# Patient Record
Sex: Female | Born: 1935 | Race: White | Hispanic: No | Smoking: Never smoker
Health system: Southern US, Community
[De-identification: ages and names within clinical notes are randomized; demographics above are authoritative.]

## PROBLEM LIST (undated history)

## (undated) DIAGNOSIS — Z8739 Personal history of other diseases of the musculoskeletal system and connective tissue: Secondary | ICD-10-CM

## (undated) DIAGNOSIS — M5030 Other cervical disc degeneration, unspecified cervical region: Secondary | ICD-10-CM

## (undated) DIAGNOSIS — K219 Gastro-esophageal reflux disease without esophagitis: Secondary | ICD-10-CM

## (undated) DIAGNOSIS — J329 Chronic sinusitis, unspecified: Secondary | ICD-10-CM

## (undated) DIAGNOSIS — M2010 Hallux valgus (acquired), unspecified foot: Secondary | ICD-10-CM

## (undated) DIAGNOSIS — K648 Other hemorrhoids: Secondary | ICD-10-CM

## (undated) DIAGNOSIS — F419 Anxiety disorder, unspecified: Secondary | ICD-10-CM

## (undated) DIAGNOSIS — I10 Essential (primary) hypertension: Secondary | ICD-10-CM

## (undated) DIAGNOSIS — K449 Diaphragmatic hernia without obstruction or gangrene: Secondary | ICD-10-CM

## (undated) DIAGNOSIS — K2970 Gastritis, unspecified, without bleeding: Secondary | ICD-10-CM

## (undated) DIAGNOSIS — F32A Depression, unspecified: Secondary | ICD-10-CM

## (undated) DIAGNOSIS — K2270 Barrett's esophagus without dysplasia: Secondary | ICD-10-CM

## (undated) DIAGNOSIS — Z9109 Other allergy status, other than to drugs and biological substances: Secondary | ICD-10-CM

## (undated) DIAGNOSIS — K5790 Diverticulosis of intestine, part unspecified, without perforation or abscess without bleeding: Secondary | ICD-10-CM

## (undated) DIAGNOSIS — N760 Acute vaginitis: Secondary | ICD-10-CM

## (undated) DIAGNOSIS — N6019 Diffuse cystic mastopathy of unspecified breast: Secondary | ICD-10-CM

## (undated) DIAGNOSIS — Z8619 Personal history of other infectious and parasitic diseases: Secondary | ICD-10-CM

## (undated) DIAGNOSIS — H25019 Cortical age-related cataract, unspecified eye: Secondary | ICD-10-CM

## (undated) DIAGNOSIS — F329 Major depressive disorder, single episode, unspecified: Secondary | ICD-10-CM

## (undated) DIAGNOSIS — E785 Hyperlipidemia, unspecified: Secondary | ICD-10-CM

## (undated) DIAGNOSIS — E7800 Pure hypercholesterolemia, unspecified: Secondary | ICD-10-CM

## (undated) DIAGNOSIS — N951 Menopausal and female climacteric states: Secondary | ICD-10-CM

## (undated) DIAGNOSIS — M898X9 Other specified disorders of bone, unspecified site: Secondary | ICD-10-CM

## (undated) HISTORY — PX: NISSEN FUNDOPLICATION: SHX2091

## (undated) HISTORY — PX: CATARACT EXTRACTION W/ INTRAOCULAR LENS IMPLANTW/ TRABECULECTOMY: SHX1312

## (undated) HISTORY — PX: BREAST SURGERY: SHX581

## (undated) HISTORY — PX: HERNIA REPAIR: SHX51

## (undated) HISTORY — PX: ABDOMINAL HYSTERECTOMY: SHX81

## (undated) HISTORY — PX: CHOLECYSTECTOMY: SHX55

## (undated) HISTORY — PX: TONSILLECTOMY: SUR1361

## (undated) HISTORY — PX: APPENDECTOMY: SHX54

---

## 2002-12-15 ENCOUNTER — Encounter: Payer: Self-pay | Admitting: *Deleted

## 2002-12-15 ENCOUNTER — Emergency Department (HOSPITAL_COMMUNITY): Admission: EM | Admit: 2002-12-15 | Discharge: 2002-12-15 | Payer: Self-pay | Admitting: Emergency Medicine

## 2005-09-05 ENCOUNTER — Ambulatory Visit: Payer: Self-pay | Admitting: Internal Medicine

## 2006-09-07 ENCOUNTER — Ambulatory Visit: Payer: Self-pay | Admitting: Internal Medicine

## 2007-04-02 ENCOUNTER — Ambulatory Visit: Payer: Self-pay | Admitting: Gastroenterology

## 2007-05-04 ENCOUNTER — Ambulatory Visit: Payer: Self-pay | Admitting: Gastroenterology

## 2008-01-18 ENCOUNTER — Ambulatory Visit: Payer: Self-pay | Admitting: Internal Medicine

## 2008-12-09 ENCOUNTER — Ambulatory Visit: Payer: Self-pay | Admitting: Internal Medicine

## 2009-01-20 ENCOUNTER — Ambulatory Visit: Payer: Self-pay | Admitting: Internal Medicine

## 2009-01-27 ENCOUNTER — Emergency Department: Payer: Self-pay | Admitting: Internal Medicine

## 2009-01-29 ENCOUNTER — Ambulatory Visit: Payer: Self-pay | Admitting: Internal Medicine

## 2009-02-10 ENCOUNTER — Ambulatory Visit: Payer: Self-pay | Admitting: Internal Medicine

## 2009-08-13 ENCOUNTER — Ambulatory Visit: Payer: Self-pay | Admitting: Internal Medicine

## 2009-08-26 ENCOUNTER — Ambulatory Visit: Payer: Self-pay | Admitting: Family Medicine

## 2010-01-21 ENCOUNTER — Ambulatory Visit: Payer: Self-pay | Admitting: Internal Medicine

## 2010-07-08 ENCOUNTER — Ambulatory Visit: Payer: Self-pay | Admitting: Gastroenterology

## 2010-07-12 LAB — PATHOLOGY REPORT

## 2011-01-13 ENCOUNTER — Ambulatory Visit: Payer: Self-pay | Admitting: Sports Medicine

## 2011-02-17 ENCOUNTER — Ambulatory Visit: Payer: Self-pay | Admitting: Internal Medicine

## 2011-03-03 ENCOUNTER — Ambulatory Visit: Payer: Self-pay | Admitting: Internal Medicine

## 2011-10-19 IMAGING — US ULTRASOUND LEFT BREAST
1 series · 17 of 19 positions shown · non-contrast
Comparison: none

REASON FOR EXAM: lt nodule
COMMENTS:

[Series 1: ultrasound left breast · 17 of 19 slices shown]
[im 1/19]
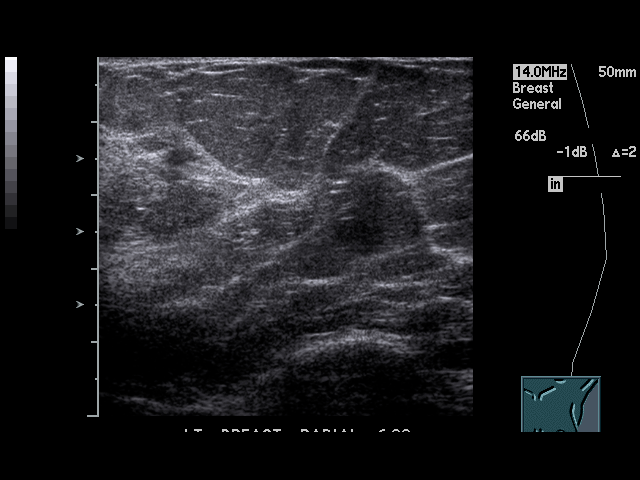
[im 2/19]
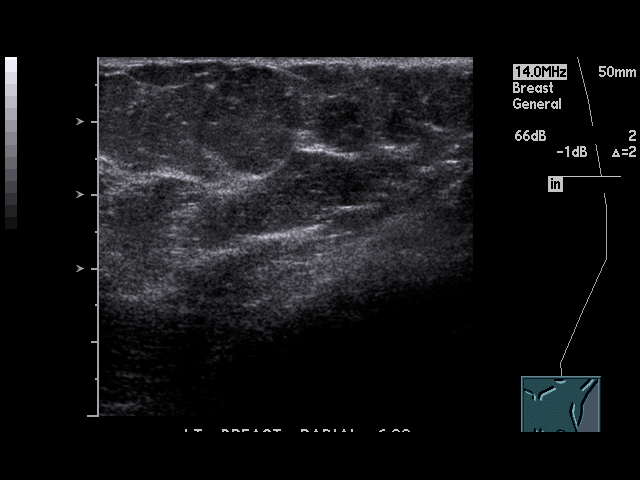
[im 3/19]
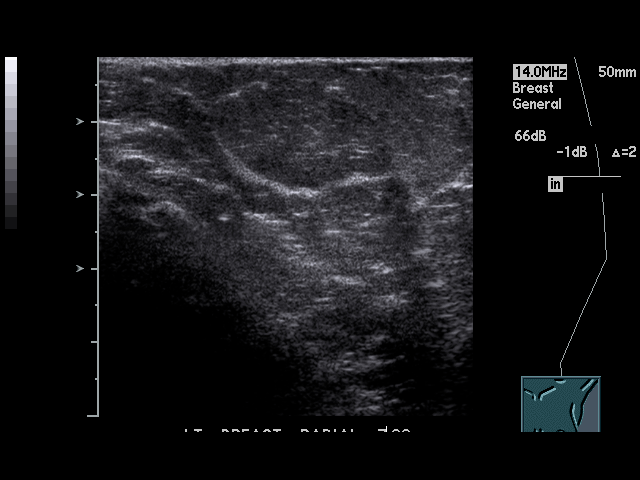
[im 4/19]
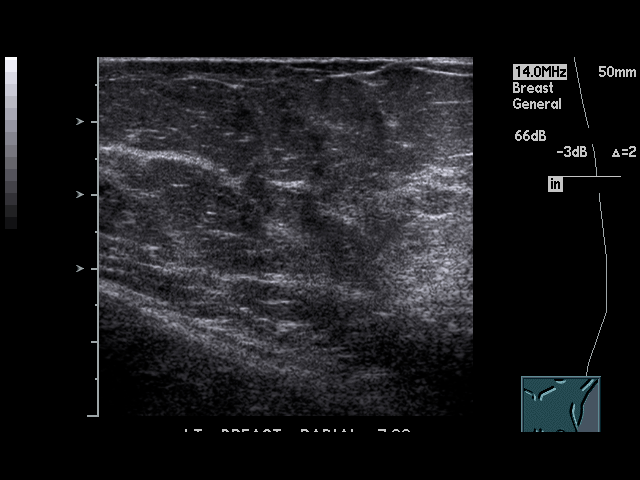
[im 6/19]
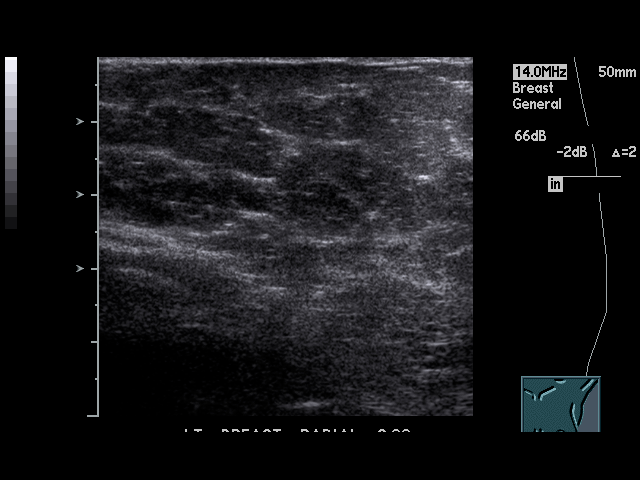
[im 7/19]
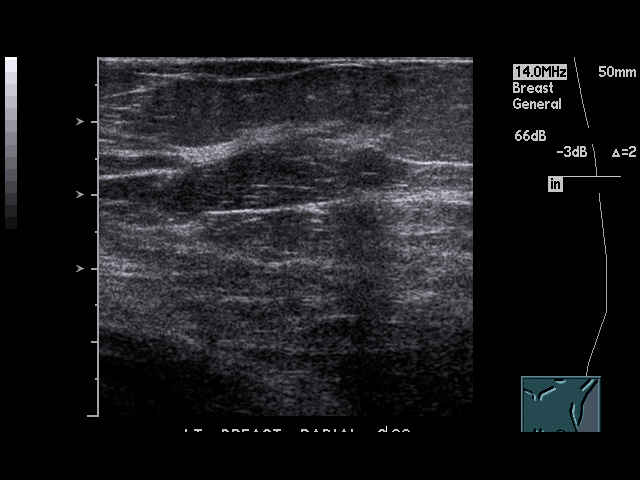
[im 8/19]
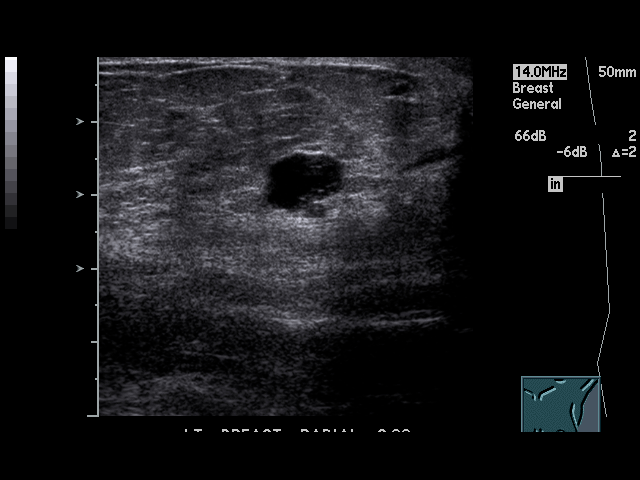
[im 9/19]
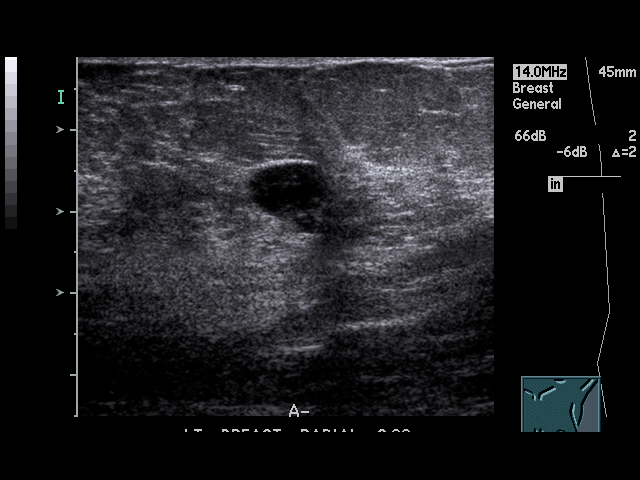
[im 10/19]
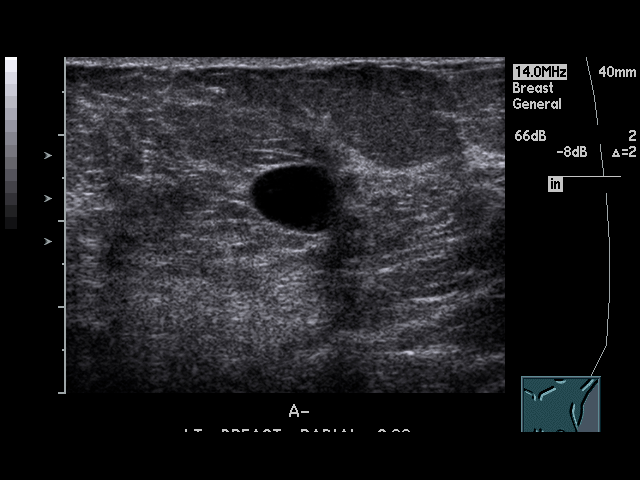
[im 11/19]
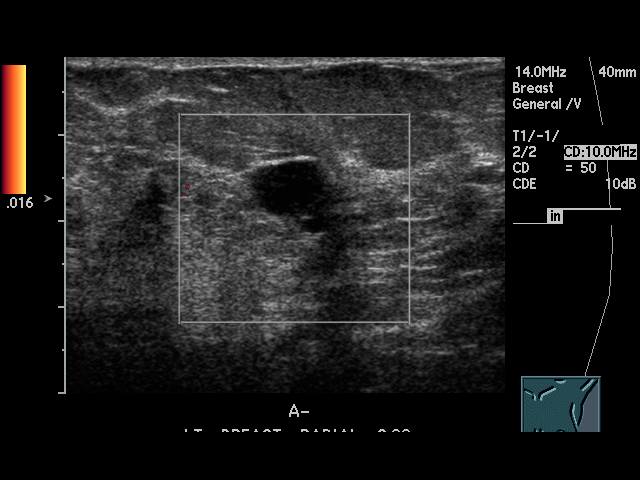
[im 12/19]
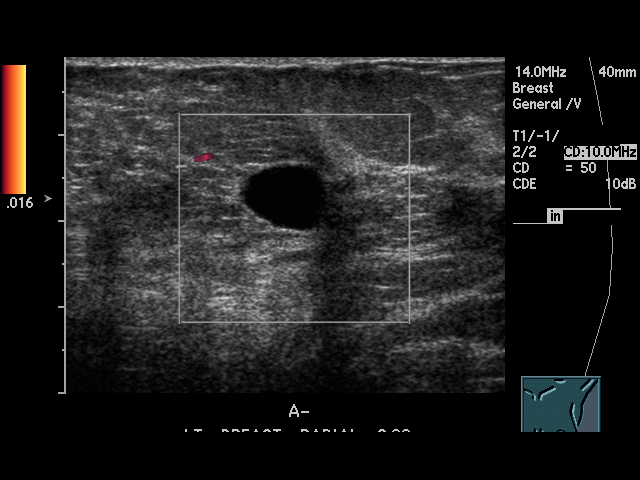
[im 13/19]
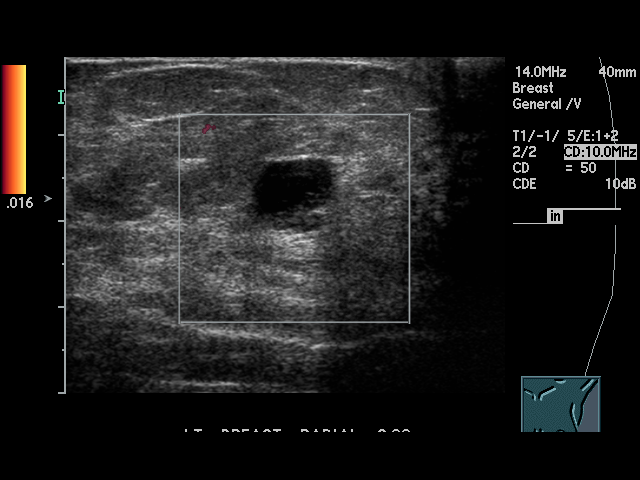
[im 14/19]
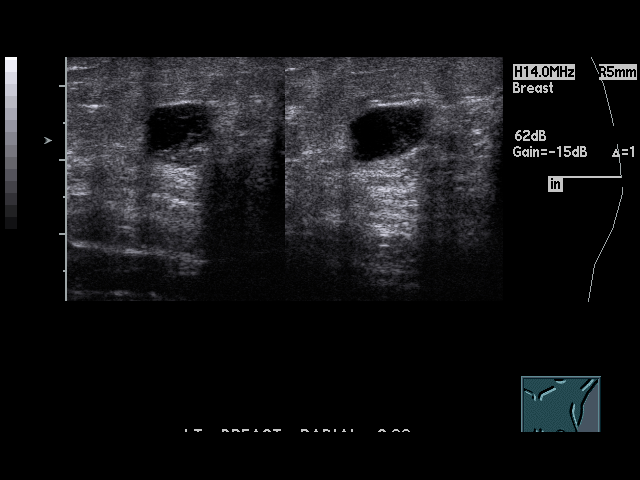
[im 16/19]
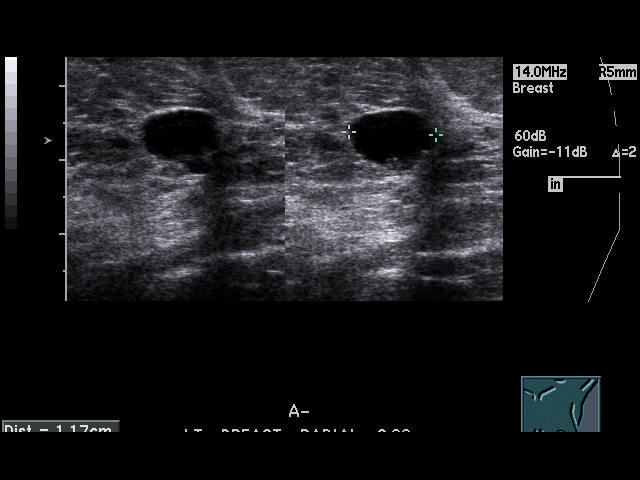
[im 17/19]
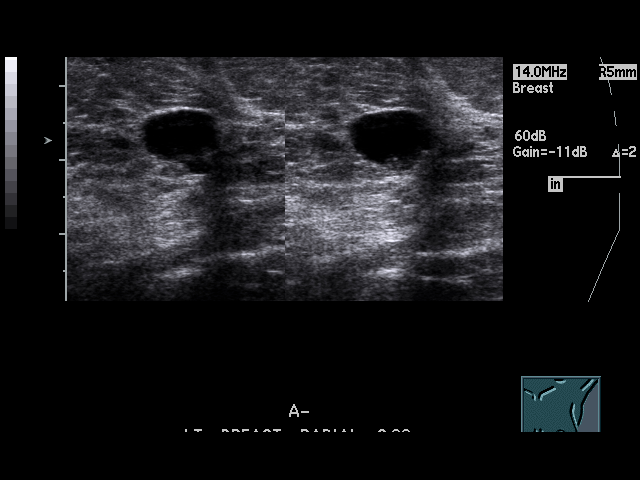
[im 18/19]
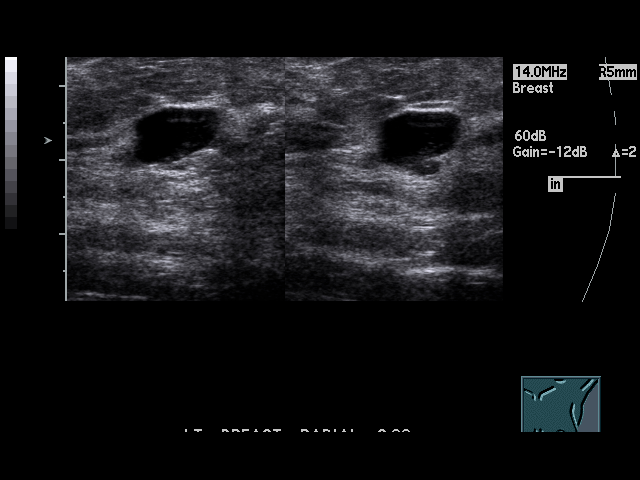
[im 19/19]
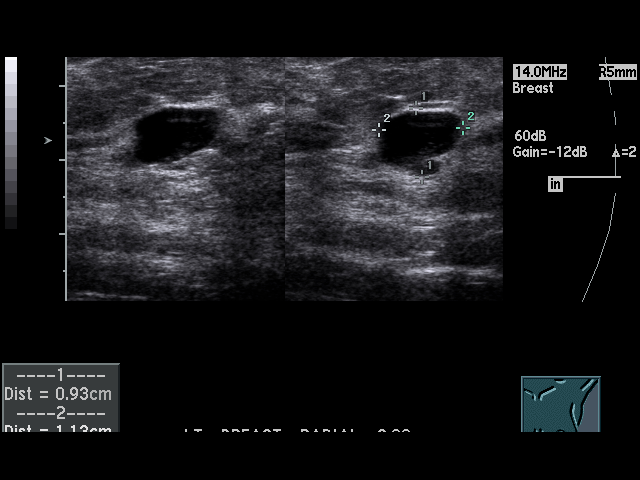

[17 of 19 positions shown; findings below may reference images not displayed]

PROCEDURE:     US  - US BREAST LEFT  - March 03, 2011 [DATE]

RESULT:

Ultrasound was performed of the left breast and reveals a cyst with possible
internal echoes and/or debris. Mild septation may be present. The cyst
measures 1 cm. Needle aspiration of the cyst is suggested to exclude a
malignant cyst.
IMPRESSION: A complex cyst is noted of the left breast for which needle
aspiration is suggested to evaluate for a possible malignant cyst. This is
most likely a benign cyst containing debris.

BI-RADS: Category 4 - Suspicious Abnormality

## 2011-11-15 ENCOUNTER — Emergency Department: Payer: Self-pay | Admitting: Emergency Medicine

## 2011-11-18 ENCOUNTER — Emergency Department: Payer: Self-pay | Admitting: Internal Medicine

## 2011-12-09 ENCOUNTER — Ambulatory Visit: Payer: Self-pay | Admitting: Internal Medicine

## 2012-02-20 ENCOUNTER — Ambulatory Visit: Payer: Self-pay | Admitting: Internal Medicine

## 2012-09-18 ENCOUNTER — Ambulatory Visit: Payer: Self-pay | Admitting: Physical Medicine and Rehabilitation

## 2013-03-06 ENCOUNTER — Ambulatory Visit: Payer: Self-pay | Admitting: Internal Medicine

## 2013-03-21 ENCOUNTER — Ambulatory Visit: Payer: Self-pay | Admitting: Internal Medicine

## 2013-05-21 ENCOUNTER — Ambulatory Visit: Payer: Self-pay | Admitting: Cardiology

## 2013-11-06 IMAGING — US ULTRASOUND LEFT BREAST
2 series · 13 of 25 positions shown · non-contrast
Comparison: none

REASON FOR EXAM: av lt mass
COMMENTS:

PROCEDURE:     US  - US BREAST LEFT  - March 21, 2013  [DATE]
RESULT:
Comparisons: 03/06/2013, 02/20/2012, 02/17/2011, and 01/21/2010.

[Series 1: ultrasound left breast · 0.08mm/px · 12 of 45 slices shown (1 of 2)]
[im 1/45]
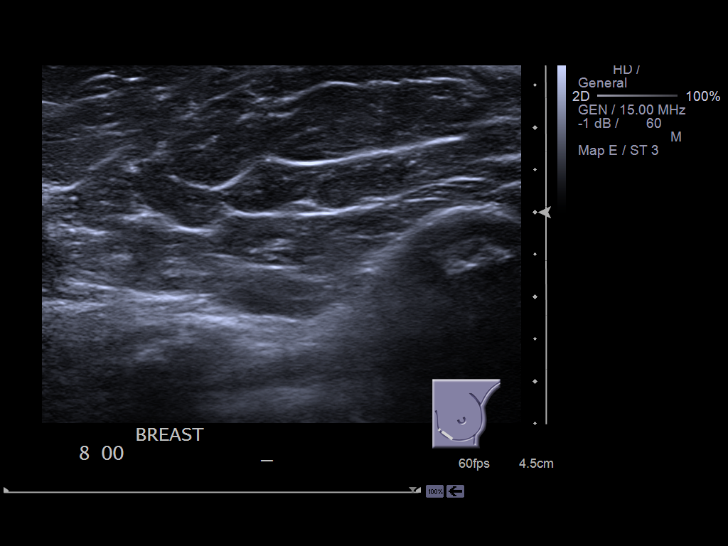
[im 4/45]
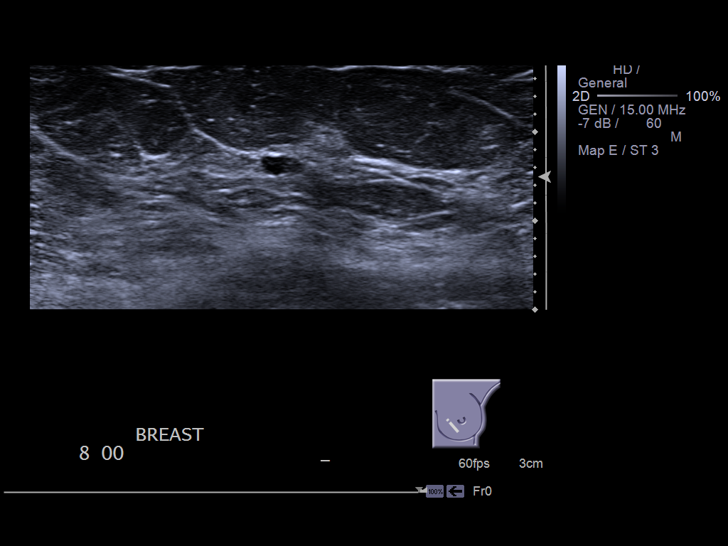
[im 8/45]
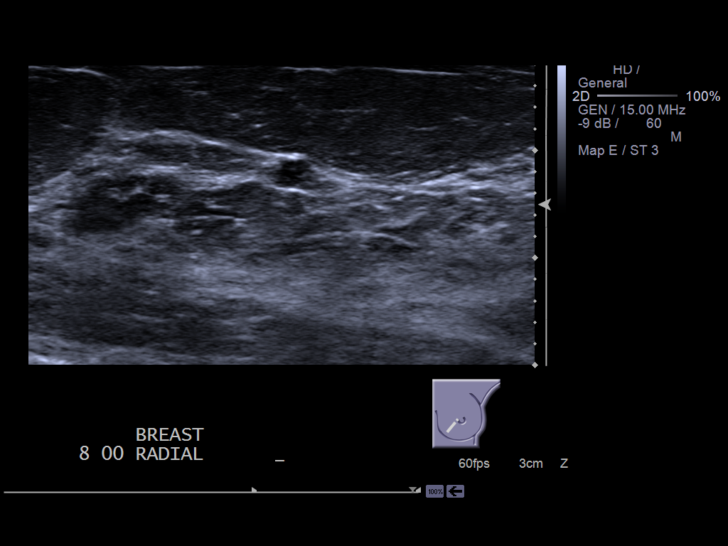
[im 12/45]
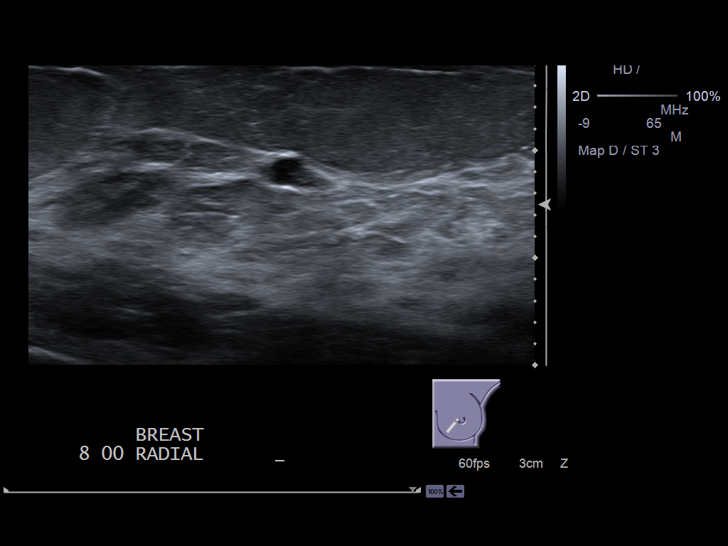
[im 16/45]
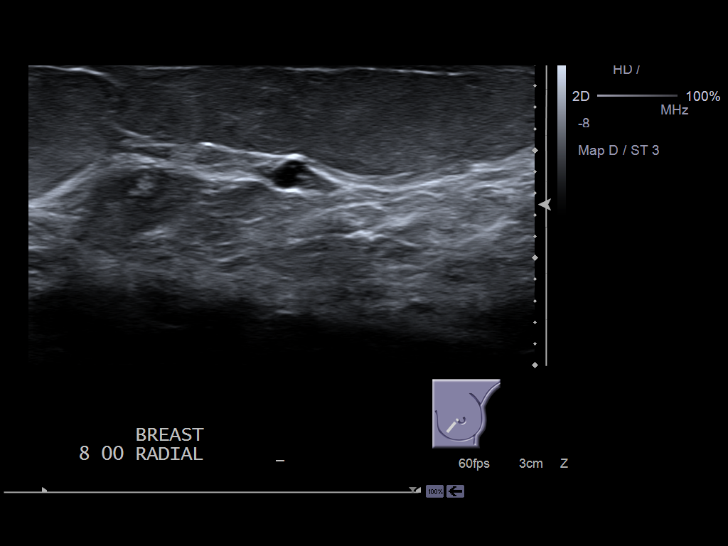
[im 20/45]
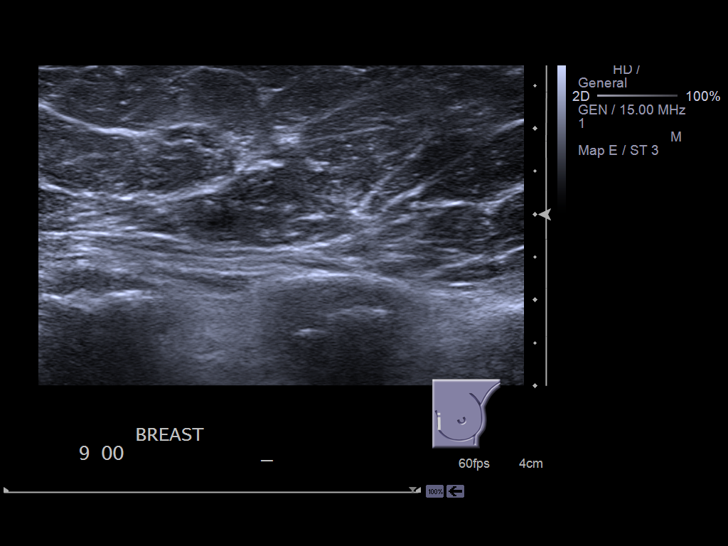
[im 23/45]
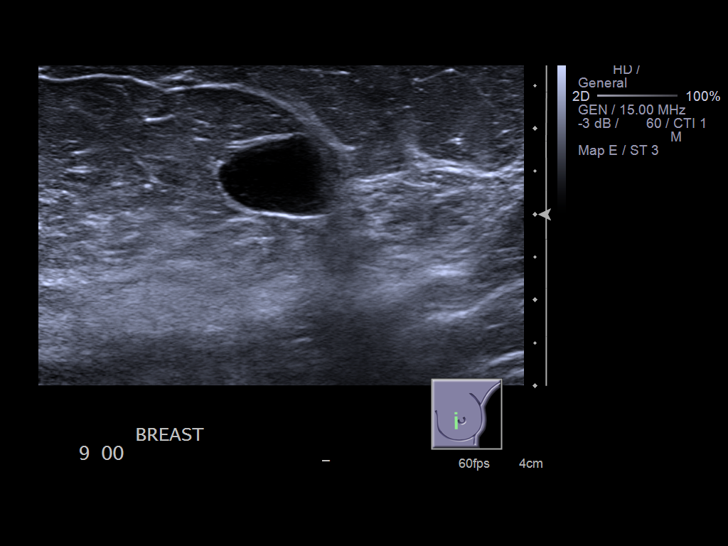
[im 27/45]
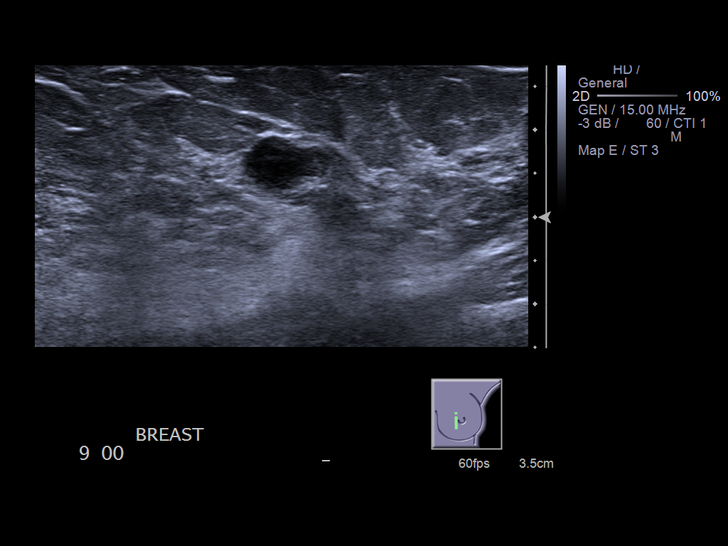
[im 31/45]
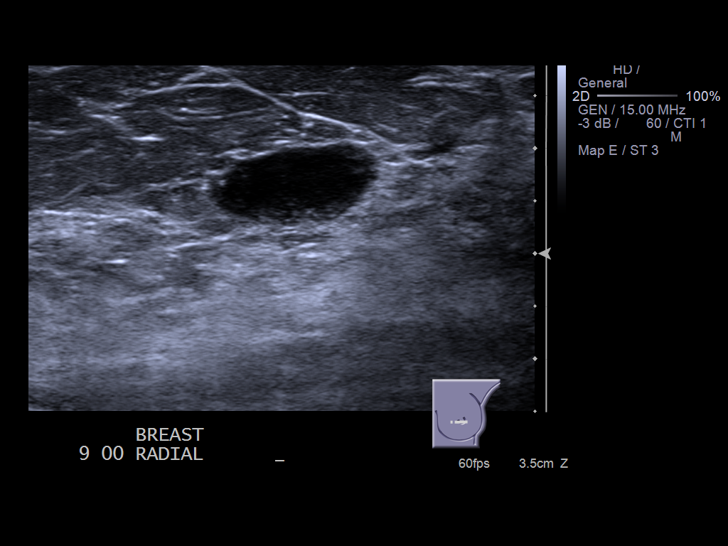
[im 35/45]
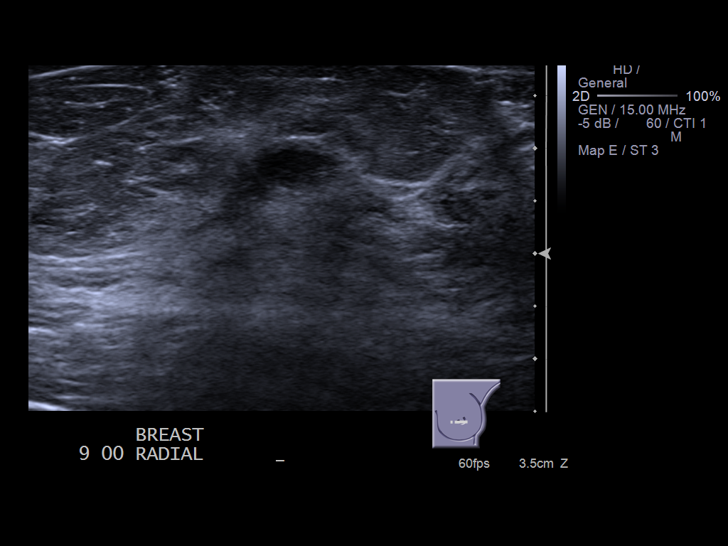
[im 39/45]
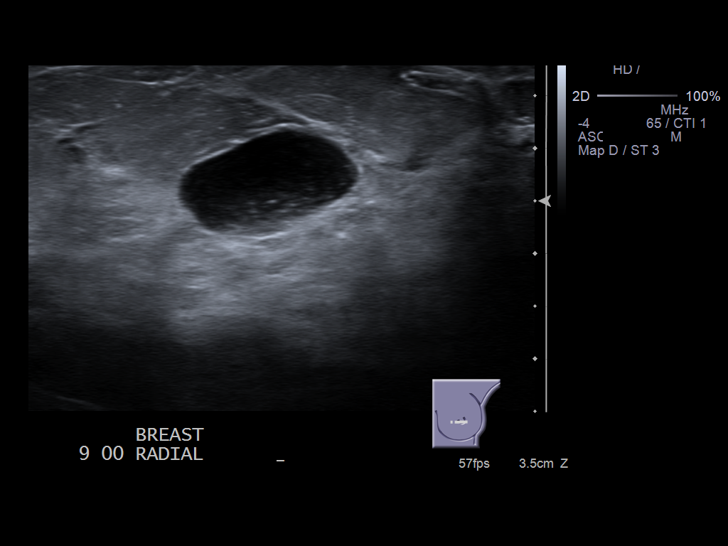
[im 43/45]
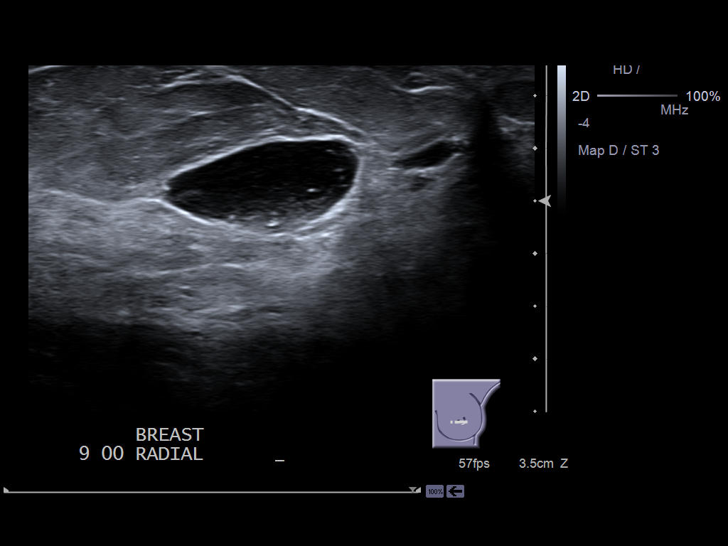

[Series 1001: ultrasound left breast · 2 acquisitions, 1 frame shown (2 of 2)]
[im 1/2]
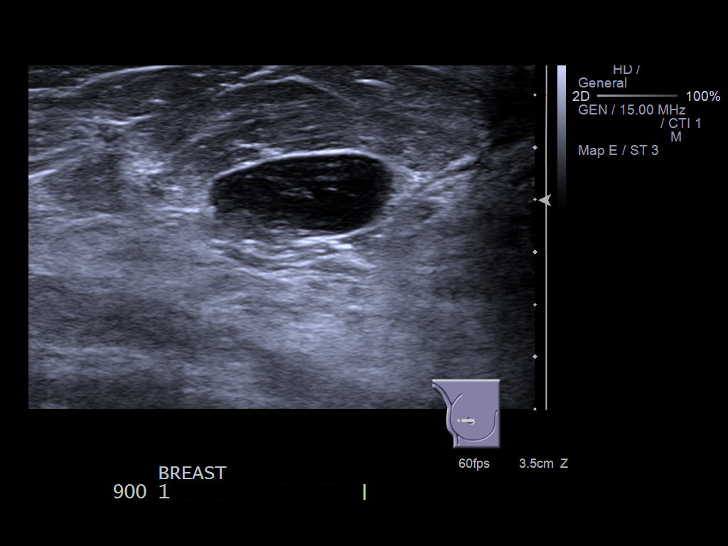

[13 of 25 positions shown; findings below may reference images not displayed]

FINDINGS: True lateral and spot compression magnification views were performed to
evaluate the round mass in the medial left breast anterior depth. These
views demonstrate a well circumscribed round mass at approximately 8-9
o'clock in the anterior left breast. This mass has increased in size from
the prior studies. By report, this mass has been aspirated in the past.
There is a small cluster of round microcalcifications on the spot
compression magnification views which is similar to multiple prior
mammograms.

Real time ultrasound was performed of the left breast from 8 o'clock through
9 o'clock. At 9 o'clock there is an oval, well-circumscribed mass which
measures 1.7 x 1.5 x 1.1 cm. Some of the mass is nearly anechoic, while
there is hypoechogenicity along the dependent portion of the mass. This
hypoechogenicity could represent debris. However, a solid component cannot
be excluded. There is suggestion of some posterior acoustic enhancement to
the mass, though this is not definitive. Separately, at 8 o'clock there is a
tiny nearly anechoic round mass. It measures 3 mm in diameter. It is
difficult to characterize given its very small size, but likely represents a
cyst.
IMPRESSION: BI-RADS: Category 4 - Suspicious Abnormality.

1. The mass in the medial left breast could represent a complicated cyst.
However, a solid and cystic mass cannot be excluded. Additionally, there
does not appear to be definitive posterior acoustic enhancement to this
mass. By report, the mass has been aspirated in the past. However, given the
increase in size and the areas of hypoechogenicity, aspiration of this mass
is recommended with potential for biopsy if there are any solid areas.

## 2013-12-26 HISTORY — PX: BREAST BIOPSY: SHX20

## 2013-12-26 HISTORY — PX: BREAST CYST ASPIRATION: SHX578

## 2014-03-25 ENCOUNTER — Ambulatory Visit: Payer: Self-pay | Admitting: Internal Medicine

## 2014-04-02 ENCOUNTER — Ambulatory Visit: Payer: Self-pay | Admitting: Ophthalmology

## 2014-04-11 ENCOUNTER — Ambulatory Visit: Payer: Self-pay | Admitting: Surgery

## 2014-04-14 LAB — PATHOLOGY REPORT

## 2014-08-13 ENCOUNTER — Ambulatory Visit: Payer: Self-pay | Admitting: Ophthalmology

## 2014-11-27 IMAGING — MG MM POST US BIOPSY *L*
1 series · 2 of 2 positions shown · non-contrast
Comparison: Previous exams

CLINICAL DATA: Ultrasound-guided biopsy of a left breast mass in
the 9 o'clock position.

EXAM:
DIAGNOSTIC LEFT MAMMOGRAM POST ULTRASOUND BIOPSY

[L CC · left · 2 of 2 slices shown]
[im 1/2]
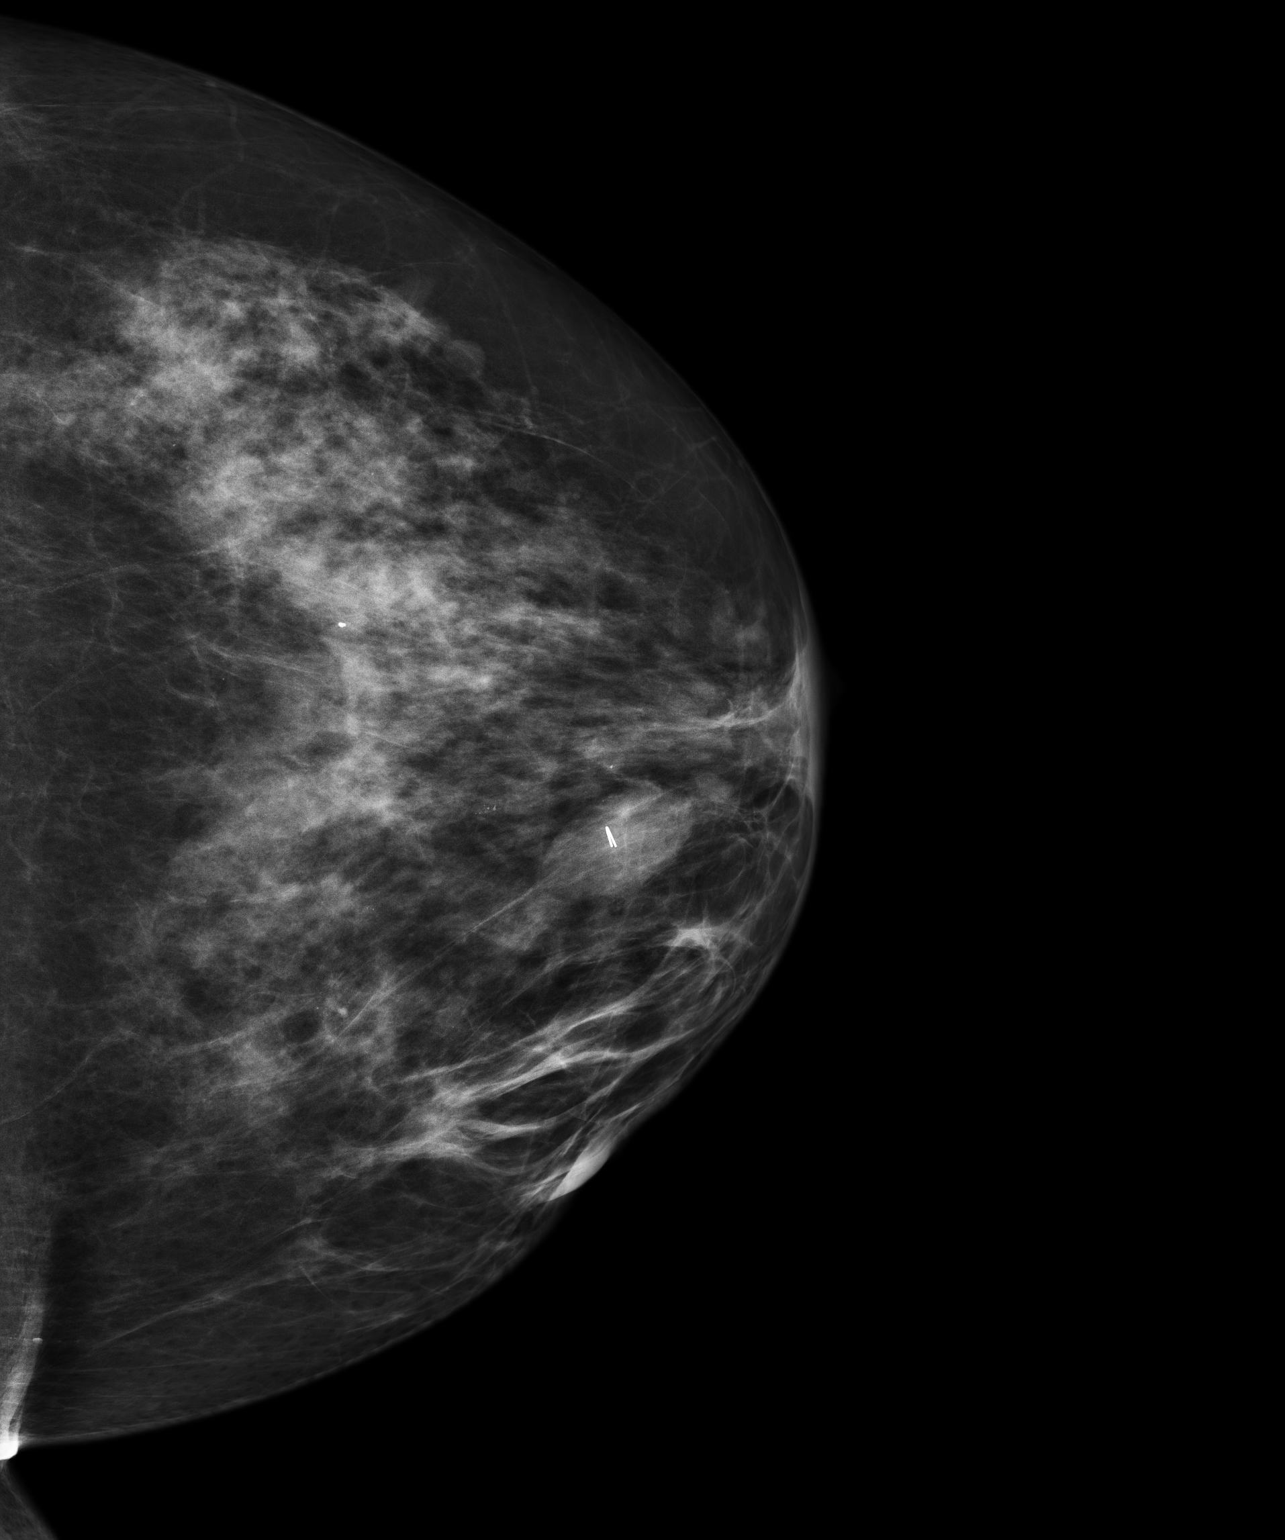
[im 2/2]
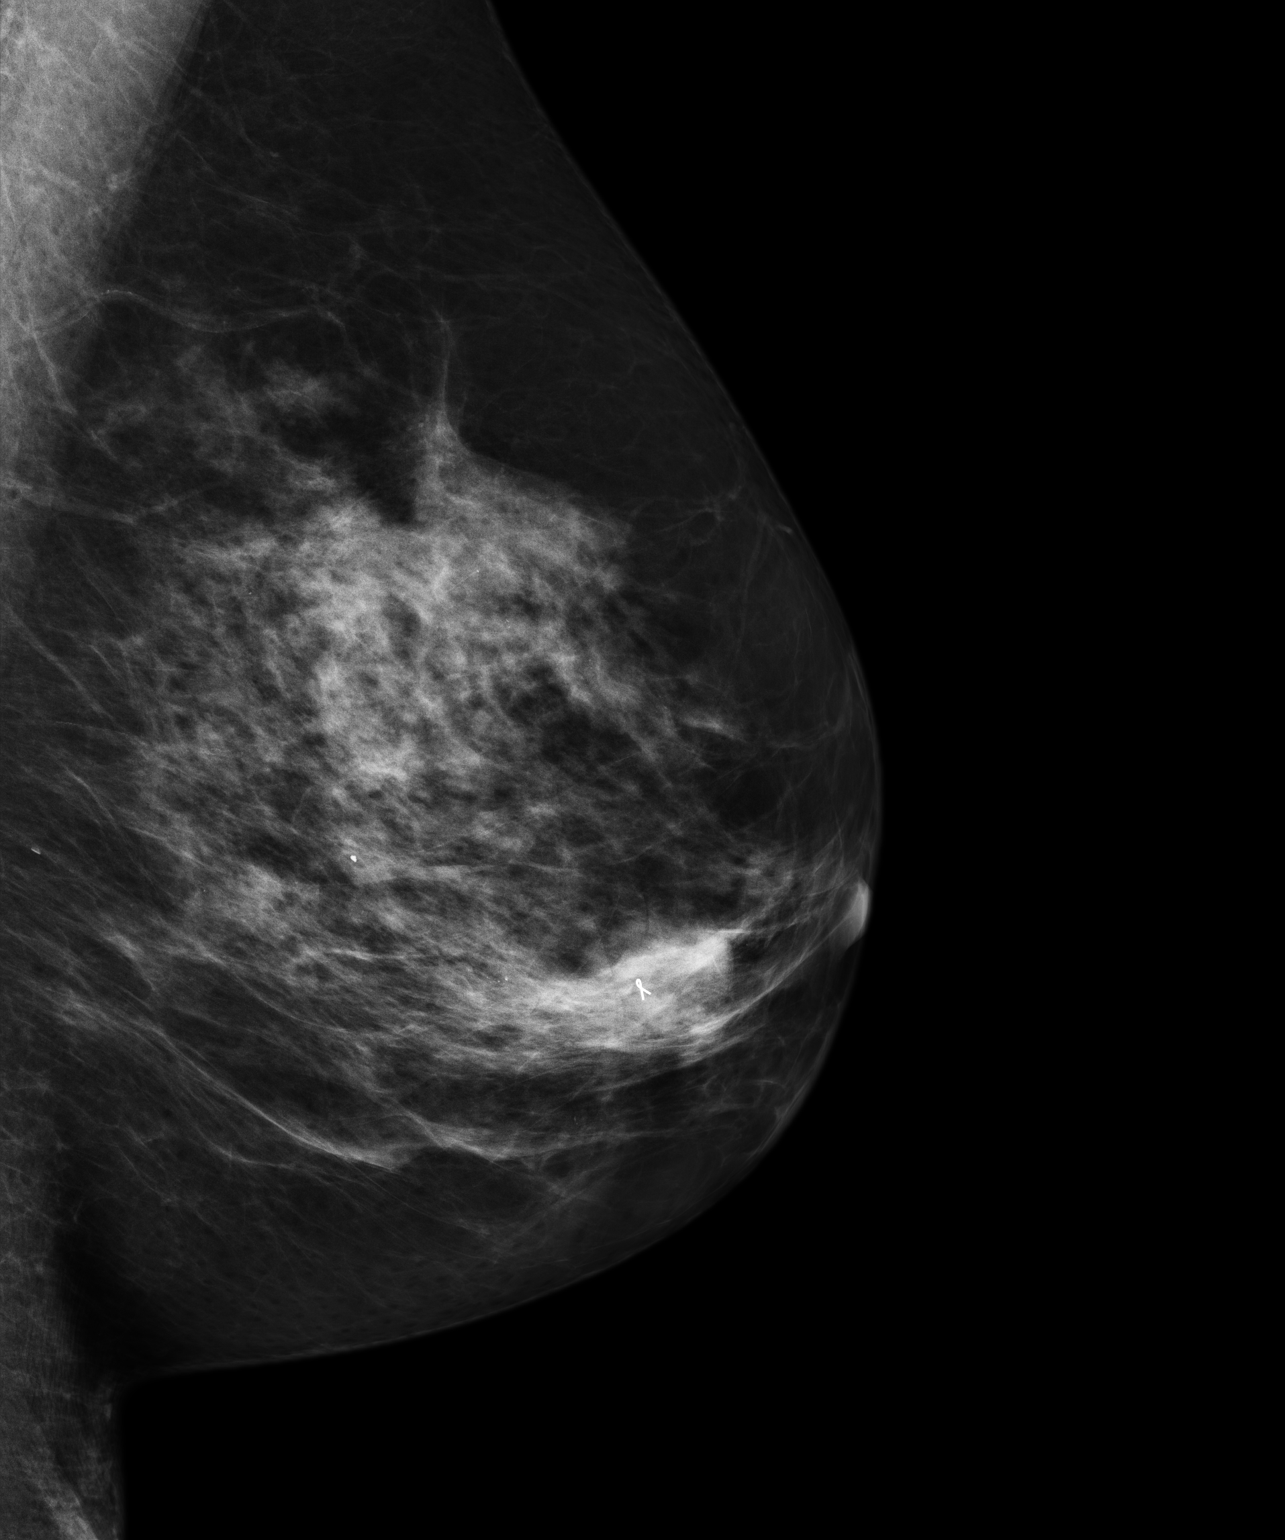

[2 of 2 positions shown; findings below may reference images not displayed]

FINDINGS: Mammographic images were obtained following ultrasound guided biopsy
of a mass in the 9 o'clock position of the left breast. A ribbon
shaped biopsy clip is satisfactorily positioned within the mass.
IMPRESSION: Satisfactory position of ribbon shaped biopsy clip in the left
breast mass.

Final Assessment: Post Procedure Mammograms for Marker Placement

## 2015-03-31 ENCOUNTER — Ambulatory Visit
Admit: 2015-03-31 | Disposition: A | Discharge status: Home / Self Care | Payer: Self-pay | Attending: Internal Medicine | Admitting: Internal Medicine

## 2015-08-24 ENCOUNTER — Encounter: Payer: Self-pay | Admitting: *Deleted

## 2015-08-25 ENCOUNTER — Ambulatory Visit: Payer: Medicare Other | Admitting: Certified Registered Nurse Anesthetist

## 2015-08-25 ENCOUNTER — Ambulatory Visit
Admission: RE | Admit: 2015-08-25 | Discharge: 2015-08-25 | Disposition: A | Discharge status: Home / Self Care | Payer: Medicare Other | Source: Ambulatory Visit | Attending: Gastroenterology | Admitting: Gastroenterology

## 2015-08-25 ENCOUNTER — Encounter
Admission: RE | Disposition: A | Discharge status: Home / Self Care | Payer: Self-pay | Source: Ambulatory Visit | Attending: Gastroenterology

## 2015-08-25 ENCOUNTER — Encounter: Payer: Self-pay | Admitting: *Deleted

## 2015-08-25 DIAGNOSIS — I10 Essential (primary) hypertension: Secondary | ICD-10-CM | POA: Diagnosis not present

## 2015-08-25 DIAGNOSIS — M5030 Other cervical disc degeneration, unspecified cervical region: Secondary | ICD-10-CM | POA: Diagnosis not present

## 2015-08-25 DIAGNOSIS — K219 Gastro-esophageal reflux disease without esophagitis: Secondary | ICD-10-CM | POA: Diagnosis not present

## 2015-08-25 DIAGNOSIS — K3189 Other diseases of stomach and duodenum: Secondary | ICD-10-CM | POA: Insufficient documentation

## 2015-08-25 DIAGNOSIS — E780 Pure hypercholesterolemia: Secondary | ICD-10-CM | POA: Insufficient documentation

## 2015-08-25 DIAGNOSIS — Z1211 Encounter for screening for malignant neoplasm of colon: Secondary | ICD-10-CM | POA: Insufficient documentation

## 2015-08-25 DIAGNOSIS — K449 Diaphragmatic hernia without obstruction or gangrene: Secondary | ICD-10-CM | POA: Insufficient documentation

## 2015-08-25 DIAGNOSIS — F329 Major depressive disorder, single episode, unspecified: Secondary | ICD-10-CM | POA: Insufficient documentation

## 2015-08-25 DIAGNOSIS — Z881 Allergy status to other antibiotic agents status: Secondary | ICD-10-CM | POA: Diagnosis not present

## 2015-08-25 DIAGNOSIS — Z7982 Long term (current) use of aspirin: Secondary | ICD-10-CM | POA: Insufficient documentation

## 2015-08-25 DIAGNOSIS — K2270 Barrett's esophagus without dysplasia: Secondary | ICD-10-CM | POA: Insufficient documentation

## 2015-08-25 DIAGNOSIS — K5730 Diverticulosis of large intestine without perforation or abscess without bleeding: Secondary | ICD-10-CM | POA: Insufficient documentation

## 2015-08-25 DIAGNOSIS — Z885 Allergy status to narcotic agent status: Secondary | ICD-10-CM | POA: Insufficient documentation

## 2015-08-25 DIAGNOSIS — K648 Other hemorrhoids: Secondary | ICD-10-CM | POA: Diagnosis not present

## 2015-08-25 HISTORY — DX: Major depressive disorder, single episode, unspecified: F32.9

## 2015-08-25 HISTORY — DX: Menopausal and female climacteric states: N95.1

## 2015-08-25 HISTORY — DX: Other specified disorders of bone, unspecified site: M89.8X9

## 2015-08-25 HISTORY — DX: Pure hypercholesterolemia, unspecified: E78.00

## 2015-08-25 HISTORY — DX: Diffuse cystic mastopathy of unspecified breast: N60.19

## 2015-08-25 HISTORY — PX: ESOPHAGOGASTRODUODENOSCOPY (EGD) WITH PROPOFOL: SHX5813

## 2015-08-25 HISTORY — PX: COLONOSCOPY WITH PROPOFOL: SHX5780

## 2015-08-25 HISTORY — DX: Gastro-esophageal reflux disease without esophagitis: K21.9

## 2015-08-25 HISTORY — DX: Depression, unspecified: F32.A

## 2015-08-25 HISTORY — DX: Essential (primary) hypertension: I10

## 2015-08-25 HISTORY — DX: Hallux valgus (acquired), unspecified foot: M20.10

## 2015-08-25 HISTORY — DX: Other cervical disc degeneration, unspecified cervical region: M50.30

## 2015-08-25 SURGERY — COLONOSCOPY WITH PROPOFOL
Anesthesia: General

## 2015-08-25 MED ORDER — GLYCOPYRROLATE 0.2 MG/ML IJ SOLN
INTRAMUSCULAR | Status: DC | PRN
  Administered 2015-08-25: 0.2 mg via INTRAVENOUS

## 2015-08-25 MED ORDER — LIDOCAINE HCL (CARDIAC) 20 MG/ML IV SOLN
INTRAVENOUS | Status: DC | PRN
  Administered 2015-08-25: 20 mg via INTRAVENOUS

## 2015-08-25 MED ORDER — SODIUM CHLORIDE 0.9 % IV SOLN
INTRAVENOUS | Status: DC
  Administered 2015-08-25: 1000 mL via INTRAVENOUS

## 2015-08-25 MED ORDER — PROPOFOL INFUSION 10 MG/ML OPTIME
INTRAVENOUS | Status: DC | PRN
  Administered 2015-08-25: 150 ug/kg/min via INTRAVENOUS

## 2015-08-25 MED ORDER — SODIUM CHLORIDE 0.9 % IV SOLN
INTRAVENOUS | Status: DC
  Administered 2015-08-25: 13:00:00 via INTRAVENOUS

## 2015-08-25 NOTE — Transfer of Care (Signed)
Immediate Anesthesia Transfer of Care Note  Patient: Rachel Harris  Procedure(s) Performed: Procedure(s): COLONOSCOPY WITH PROPOFOL (N/A) ESOPHAGOGASTRODUODENOSCOPY (EGD) WITH PROPOFOL (N/A)  Patient Location: PACU  Anesthesia Type:General  Level of Consciousness: awake  Airway & Oxygen Therapy: Patient Spontanous Breathing  Post-op Assessment: Report given to RN  Post vital signs: stable  Last Vitals:  Filed Vitals:   08/25/15 1245  BP: 155/66  Pulse: 56  Temp: 36.1 C  Resp: 16    Complications: No apparent anesthesia complications

## 2015-08-25 NOTE — Anesthesia Postprocedure Evaluation (Signed)
  Anesthesia Post-op Note  Patient: Rachel Harris  Procedure(s) Performed: Procedure(s): COLONOSCOPY WITH PROPOFOL (N/A) ESOPHAGOGASTRODUODENOSCOPY (EGD) WITH PROPOFOL (N/A)  Anesthesia type:General  Patient location: PACU  Post pain: Pain level controlled  Post assessment: Post-op Vital signs reviewed, Patient's Cardiovascular Status Stable, Respiratory Function Stable, Patent Airway and No signs of Nausea or vomiting  Post vital signs: Reviewed and stable  Last Vitals:  Filed Vitals:   08/25/15 1245  BP: 155/66  Pulse: 56  Temp: 36.1 C  Resp: 16    Level of consciousness: awake, alert  and patient cooperative  Complications: No apparent anesthesia complications

## 2015-08-25 NOTE — Anesthesia Preprocedure Evaluation (Signed)
Anesthesia Evaluation  Patient identified by MRN, date of birth, ID band Patient awake    Reviewed: Allergy & Precautions, NPO status , Patient's Chart, lab work & pertinent test results  History of Anesthesia Complications Negative for: history of anesthetic complications  Airway Mallampati: II       Dental no notable dental hx. (+) Upper Dentures   Pulmonary neg pulmonary ROS,    Pulmonary exam normal       Cardiovascular hypertension, negative cardio ROS Normal cardiovascular exam    Neuro/Psych    GI/Hepatic Neg liver ROS, GERD-  ,  Endo/Other  negative endocrine ROS  Renal/GU negative Renal ROS     Musculoskeletal negative musculoskeletal ROS (+)   Abdominal Normal abdominal exam  (+)   Peds  Hematology negative hematology ROS (+)   Anesthesia Other Findings   Reproductive/Obstetrics negative OB ROS                             Anesthesia Physical Anesthesia Plan  ASA: II  Anesthesia Plan: General   Post-op Pain Management:    Induction: Intravenous  Airway Management Planned: Nasal Cannula  Additional Equipment:   Intra-op Plan:   Post-operative Plan:   Informed Consent: I have reviewed the patients History and Physical, chart, labs and discussed the procedure including the risks, benefits and alternatives for the proposed anesthesia with the patient or authorized representative who has indicated his/her understanding and acceptance.     Plan Discussed with: CRNA  Anesthesia Plan Comments:         Anesthesia Quick Evaluation

## 2015-08-25 NOTE — H&P (Signed)
Outpatient short stay form Pre-procedure 08/25/2015 12:58 PM Christena Deem MD  Primary Physician: Dr. Aram Beecham  Reason for visit:  EGD and colonoscopy  History of present illness:  Patient is a 80 year old female presenting today for an EGD and colonoscopy in regards to her personal history of Barrett's esophagus and colon cancer screening. Her last EGD was in 2011. He has been continuing to take omeprazole 20 mg a day. She takes an 81 mg aspirin but no other aspirin's or blood thinning products.  She tolerated her prep well.    Current facility-administered medications:  .  0.9 %  sodium chloride infusion, , Intravenous, Continuous, Christena Deem, MD  Prescriptions prior to admission  Medication Sig Dispense Refill Last Dose  . aspirin EC 81 MG tablet Take 81 mg by mouth daily.   Past Week at Unknown time  . estradiol (ESTRACE) 1 MG tablet Take 1 mg by mouth daily.   Past Week at Unknown time  . gentamicin ointment (GARAMYCIN) 0.1 % Apply 1 application topically 3 (three) times daily.   Past Month at Unknown time  . Multiple Vitamins-Minerals (MULTIVITAMIN WITH MINERALS) tablet Take 1 tablet by mouth daily.   Past Week at Unknown time  . omeprazole (PRILOSEC) 20 MG capsule Take 20 mg by mouth daily.   Past Week at Unknown time  . triamcinolone cream (KENALOG) 0.1 % Apply 1 application topically 2 (two) times daily.   Past Month at Unknown time     Allergies  Allergen Reactions  . Amoxicillin   . Ceclor [Cefaclor]   . Codeine   . Erythromycin   . Levaquin [Levofloxacin In D5w]   . Macrodantin [Nitrofurantoin Macrocrystal]   . Zithromax [Azithromycin]      Past Medical History  Diagnosis Date  . GERD (gastroesophageal reflux disease)   . Exostosis   . Hypertension   . Hallux valgus   . Hypercholesterolemia   . Menopausal syndrome   . Depression   . Fibrocystic breast disease   . Degenerative disc disease, cervical     Review of systems:       Physical Exam    Heart and lungs: Regular rate and rhythm without rub or gallop, lungs are bilaterally clear    HEENT: Normocephalic atraumatic eyes are anicteric    Other:     Pertinant exam for procedure: Soft nontender nondistended bowel sounds positive normoactive    Planned proceedures: EGD, colonoscopy and indicated procedures. I have discussed the risks benefits and complications of procedures to include not limited to bleeding, infection, perforation and the risk of sedation and the patient wishes to proceed.    Christena Deem, MD Gastroenterology 08/25/2015  12:58 PM

## 2015-08-25 NOTE — Op Note (Signed)
[facility] Gastroenterology Patient Name: Rachel Harris Procedure Date: 08/25/2015 1:23 PM MRN: [id number] Account #: [id number] Date of Birth: 1935-07-25 Admit Type: Outpatient Age: 80 Room: [facility] ENDO ROOM 3 Gender: Female Note Status: Finalized Procedure:         Upper GI endoscopy Indications:       Follow-up of Barrett's esophagus Providers:         Christena Deem, MD Referring MD:      Duane Lope. Judithann Sheen, MD (Referring MD) Medicines:         Monitored Anesthesia Care Complications:     No immediate complications. Procedure:         Pre-Anesthesia Assessment:                    - ASA Grade Assessment: II - A patient with mild systemic                     disease.                    After obtaining informed consent, the endoscope was passed                     under direct vision. Throughout the procedure, the                     patient's blood pressure, pulse, and oxygen saturations                     were monitored continuously. The Endoscope was introduced                     through the mouth, and advanced to the third part of                     duodenum. The upper GI endoscopy was accomplished without                     difficulty. The patient tolerated the procedure well. Findings:      A small hiatus hernia was present.      There were esophageal mucosal changes secondary to established       short-segment Barrett's disease present at the gastroesophageal       junction. Mucosa was biopsied with a cold forceps for histology in 4       quadrants at the gastroesophageal junction.      The exam of the esophagus was otherwise normal.      Patchy mild inflammation characterized by congestion (edema) and       erythema was found in the gastric body. Biopsies were taken with a cold       forceps for histology.      Evidence of a Nissen fundoplication was found in the cardia. This was       characterized by healthy appearing mucosa.      The examined  duodenum was normal. Impression:        - Small hiatus hernia.                    - Esophageal mucosal changes secondary to established                     short-segment Barrett's disease. Biopsied.                    -  Gastritis. Biopsied.                    - A Nissen fundoplication was found, characterized by                     healthy appearing mucosa.                    - Normal examined duodenum. Recommendation:    - Continue present medications.                    - Await pathology results.                    - Telephone GI clinic for pathology results in 1 week. Procedure Code(s): --- Professional ---                    [phone], Esophagogastroduodenoscopy, flexible, transoral;                     with biopsy, single or multiple Diagnosis Code(s): --- Professional ---                    530.85, Barrett's esophagus                    535.50, Unspecified gastritis and gastroduodenitis,                     without mention of hemorrhage                    553.3, Diaphragmatic hernia without mention of obstruction                     or gangrene                    V45.89, Other postprocedural status CPT copyright 2014 American [employer] Association. All rights reserved. The codes documented in this report are preliminary and upon coder review may  be revised to meet current compliance requirements. Christena Deem, MD 08/25/2015 1:43:56 PM This report has been signed electronically. Number of Addenda: 0 Note Initiated On: 08/25/2015 1:23 PM      [facility]

## 2015-08-25 NOTE — Op Note (Signed)
[facility] Gastroenterology Patient Name: Rachel Harris Procedure Date: 08/25/2015 1:22 PM MRN: [id number] Account #: [id number] Date of Birth: 08/30/1935 Admit Type: Outpatient Age: 80 Room: [facility] ENDO ROOM 3 Gender: Female Note Status: Finalized Procedure:         Colonoscopy Indications:       Screening for colorectal malignant neoplasm Providers:         Christena Deem, MD Referring MD:      Duane Lope. Judithann Sheen, MD (Referring MD) Medicines:         Monitored Anesthesia Care Complications:     No immediate complications. Procedure:         Pre-Anesthesia Assessment:                    - ASA Grade Assessment: II - A patient with mild systemic                     disease.                    After obtaining informed consent, the colonoscope was                     passed under direct vision. Throughout the procedure, the                     patient's blood pressure, pulse, and oxygen saturations                     were monitored continuously. The Colonoscope was                     introduced through the anus and advanced to the the cecum,                     identified by appendiceal orifice and ileocecal valve. The                     colonoscopy was unusually difficult due to significant                     looping and a tortuous colon. Successful completion of the                     procedure was aided by changing the patient to a supine                     position, changing the patient to a prone position and                     using manual pressure. The quality of the bowel                     preparation was good. Findings:      Multiple small and large-mouthed diverticula were found in the sigmoid       colon, in the descending colon, in the transverse colon, at the hepatic       flexure and in the ascending colon.      Non-bleeding internal hemorrhoids were found during retroflexion and       during anoscopy. The hemorrhoids were small.      The  digital rectal exam was normal. Impression:        - Diverticulosis in the sigmoid colon, in the descending  colon, in the transverse colon, at the hepatic flexure and                     in the ascending colon.                    - Non-bleeding internal hemorrhoids.                    - No specimens collected. Recommendation:    - Repeat colonoscopy in 10 years for screening purposes. Procedure Code(s): --- Professional ---                    [phone], Colonoscopy, flexible; diagnostic, including                     collection of specimen(s) by brushing or washing, when                     performed (separate procedure) Diagnosis Code(s): --- Professional ---                    V76.51, Special screening for malignant neoplasms of colon                    455.0, Internal hemorrhoids without mention of complication                    562.10, Diverticulosis of colon (without mention of                     hemorrhage) CPT copyright 2014 American Medical Association. All rights reserved. The codes documented in this report are preliminary and upon coder review may  be revised to meet current compliance requirements. Christena Deem, MD 08/25/2015 2:35:38 PM This report has been signed electronically. Number of Addenda: 0 Note Initiated On: 08/25/2015 1:22 PM Scope Withdrawal Time: 0 hours 7 minutes 4 seconds  Total Procedure Duration: 0 hours 45 minutes 53 seconds       [facility]

## 2015-08-26 ENCOUNTER — Encounter: Payer: Self-pay | Admitting: Gastroenterology

## 2015-08-28 LAB — SURGICAL PATHOLOGY

## 2016-04-05 ENCOUNTER — Other Ambulatory Visit: Payer: Self-pay | Admitting: Internal Medicine

## 2016-04-05 DIAGNOSIS — Z1231 Encounter for screening mammogram for malignant neoplasm of breast: Secondary | ICD-10-CM

## 2016-04-21 ENCOUNTER — Ambulatory Visit
Admission: RE | Admit: 2016-04-21 | Discharge: 2016-04-21 | Disposition: A | Discharge status: Home / Self Care | Payer: Medicare Other | Source: Ambulatory Visit | Attending: Internal Medicine | Admitting: Internal Medicine

## 2016-04-21 ENCOUNTER — Other Ambulatory Visit: Payer: Self-pay | Admitting: Internal Medicine

## 2016-04-21 DIAGNOSIS — Z1231 Encounter for screening mammogram for malignant neoplasm of breast: Secondary | ICD-10-CM | POA: Insufficient documentation

## 2017-04-25 ENCOUNTER — Other Ambulatory Visit: Payer: Self-pay | Admitting: Internal Medicine

## 2017-04-25 DIAGNOSIS — Z1231 Encounter for screening mammogram for malignant neoplasm of breast: Secondary | ICD-10-CM

## 2017-04-25 DIAGNOSIS — M79662 Pain in left lower leg: Secondary | ICD-10-CM

## 2017-04-27 ENCOUNTER — Ambulatory Visit
Admission: RE | Admit: 2017-04-27 | Discharge: 2017-04-27 | Disposition: A | Discharge status: Home / Self Care | Payer: Medicare Other | Source: Ambulatory Visit | Attending: Internal Medicine | Admitting: Internal Medicine

## 2017-04-27 DIAGNOSIS — Z1231 Encounter for screening mammogram for malignant neoplasm of breast: Secondary | ICD-10-CM | POA: Diagnosis present

## 2017-04-27 DIAGNOSIS — M79662 Pain in left lower leg: Secondary | ICD-10-CM | POA: Insufficient documentation

## 2018-02-19 ENCOUNTER — Encounter: Payer: Self-pay | Admitting: Emergency Medicine

## 2018-02-20 ENCOUNTER — Ambulatory Visit
Admission: RE | Admit: 2018-02-20 | Discharge: 2018-02-20 | Disposition: A | Discharge status: Home / Self Care | Payer: Medicare Other | Source: Ambulatory Visit | Attending: Gastroenterology | Admitting: Gastroenterology

## 2018-02-20 ENCOUNTER — Other Ambulatory Visit: Payer: Self-pay

## 2018-02-20 ENCOUNTER — Encounter: Payer: Self-pay | Admitting: *Deleted

## 2018-02-20 ENCOUNTER — Ambulatory Visit: Payer: Medicare Other | Admitting: Certified Registered Nurse Anesthetist

## 2018-02-20 ENCOUNTER — Encounter
Admission: RE | Disposition: A | Discharge status: Home / Self Care | Payer: Self-pay | Source: Ambulatory Visit | Attending: Gastroenterology

## 2018-02-20 DIAGNOSIS — K210 Gastro-esophageal reflux disease with esophagitis: Secondary | ICD-10-CM | POA: Insufficient documentation

## 2018-02-20 DIAGNOSIS — Z8619 Personal history of other infectious and parasitic diseases: Secondary | ICD-10-CM | POA: Insufficient documentation

## 2018-02-20 DIAGNOSIS — Z880 Allergy status to penicillin: Secondary | ICD-10-CM | POA: Diagnosis not present

## 2018-02-20 DIAGNOSIS — I10 Essential (primary) hypertension: Secondary | ICD-10-CM | POA: Diagnosis not present

## 2018-02-20 DIAGNOSIS — Z79899 Other long term (current) drug therapy: Secondary | ICD-10-CM | POA: Insufficient documentation

## 2018-02-20 DIAGNOSIS — K3189 Other diseases of stomach and duodenum: Secondary | ICD-10-CM | POA: Insufficient documentation

## 2018-02-20 DIAGNOSIS — Z8719 Personal history of other diseases of the digestive system: Secondary | ICD-10-CM | POA: Diagnosis not present

## 2018-02-20 DIAGNOSIS — Z7982 Long term (current) use of aspirin: Secondary | ICD-10-CM | POA: Diagnosis not present

## 2018-02-20 DIAGNOSIS — K2950 Unspecified chronic gastritis without bleeding: Secondary | ICD-10-CM | POA: Diagnosis not present

## 2018-02-20 DIAGNOSIS — F329 Major depressive disorder, single episode, unspecified: Secondary | ICD-10-CM | POA: Diagnosis not present

## 2018-02-20 DIAGNOSIS — Z9889 Other specified postprocedural states: Secondary | ICD-10-CM | POA: Insufficient documentation

## 2018-02-20 DIAGNOSIS — K2270 Barrett's esophagus without dysplasia: Secondary | ICD-10-CM | POA: Insufficient documentation

## 2018-02-20 DIAGNOSIS — Z885 Allergy status to narcotic agent status: Secondary | ICD-10-CM | POA: Insufficient documentation

## 2018-02-20 DIAGNOSIS — E7800 Pure hypercholesterolemia, unspecified: Secondary | ICD-10-CM | POA: Insufficient documentation

## 2018-02-20 DIAGNOSIS — Z881 Allergy status to other antibiotic agents status: Secondary | ICD-10-CM | POA: Insufficient documentation

## 2018-02-20 HISTORY — DX: Other hemorrhoids: K64.8

## 2018-02-20 HISTORY — DX: Hyperlipidemia, unspecified: E78.5

## 2018-02-20 HISTORY — DX: Acute vaginitis: N76.0

## 2018-02-20 HISTORY — DX: Diverticulosis of intestine, part unspecified, without perforation or abscess without bleeding: K57.90

## 2018-02-20 HISTORY — DX: Personal history of other diseases of the musculoskeletal system and connective tissue: Z87.39

## 2018-02-20 HISTORY — DX: Chronic sinusitis, unspecified: J32.9

## 2018-02-20 HISTORY — DX: Diaphragmatic hernia without obstruction or gangrene: K44.9

## 2018-02-20 HISTORY — PX: ESOPHAGOGASTRODUODENOSCOPY (EGD) WITH PROPOFOL: SHX5813

## 2018-02-20 HISTORY — DX: Personal history of other infectious and parasitic diseases: Z86.19

## 2018-02-20 HISTORY — DX: Other allergy status, other than to drugs and biological substances: Z91.09

## 2018-02-20 HISTORY — DX: Cortical age-related cataract, unspecified eye: H25.019

## 2018-02-20 HISTORY — DX: Gastritis, unspecified, without bleeding: K29.70

## 2018-02-20 HISTORY — DX: Barrett's esophagus without dysplasia: K22.70

## 2018-02-20 SURGERY — ESOPHAGOGASTRODUODENOSCOPY (EGD) WITH PROPOFOL
Anesthesia: General

## 2018-02-20 MED ORDER — LIDOCAINE HCL (CARDIAC) 20 MG/ML IV SOLN
INTRAVENOUS | Status: DC | PRN
  Administered 2018-02-20: 50 mg via INTRAVENOUS

## 2018-02-20 MED ORDER — SODIUM CHLORIDE 0.9 % IV SOLN
INTRAVENOUS | Status: DC
  Administered 2018-02-20: 12:00:00 via INTRAVENOUS

## 2018-02-20 MED ORDER — PROPOFOL 500 MG/50ML IV EMUL
INTRAVENOUS | Status: DC | PRN
  Administered 2018-02-20: 130 ug/kg/min via INTRAVENOUS

## 2018-02-20 MED ORDER — PROPOFOL 10 MG/ML IV BOLUS
INTRAVENOUS | Status: DC | PRN
  Administered 2018-02-20: 100 mg via INTRAVENOUS

## 2018-02-20 MED ORDER — SODIUM CHLORIDE 0.9 % IV SOLN: INTRAVENOUS | Status: DC

## 2018-02-20 MED ORDER — LIDOCAINE HCL (PF) 2 % IJ SOLN
INTRAMUSCULAR | Status: AC
  Filled 2018-02-20: qty 10

## 2018-02-20 MED ORDER — PROPOFOL 500 MG/50ML IV EMUL
INTRAVENOUS | Status: AC
  Filled 2018-02-20: qty 50

## 2018-02-20 NOTE — Anesthesia Preprocedure Evaluation (Signed)
Anesthesia Evaluation  Patient identified by MRN, date of birth, ID band Patient awake    Reviewed: Allergy & Precautions, NPO status , Patient's Chart, lab work & pertinent test results  History of Anesthesia Complications Negative for: history of anesthetic complications  Airway Mallampati: II  TM Distance: >3 FB Neck ROM: Full    Dental  (+) Upper Dentures   Pulmonary neg pulmonary ROS, neg sleep apnea, neg COPD,    breath sounds clear to auscultation- rhonchi (-) wheezing      Cardiovascular hypertension, (-) CAD, (-) Past MI, (-) Cardiac Stents and (-) CABG  Rhythm:Regular Rate:Normal - Systolic murmurs and - Diastolic murmurs    Neuro/Psych PSYCHIATRIC DISORDERS Depression negative neurological ROS     GI/Hepatic Neg liver ROS, hiatal hernia, GERD  ,  Endo/Other  negative endocrine ROSneg diabetes  Renal/GU negative Renal ROS     Musculoskeletal  (+) Arthritis ,   Abdominal (+) - obese,   Peds  Hematology negative hematology ROS (+)   Anesthesia Other Findings Past Medical History: No date: Barrett esophagus No date: Cataract cortical, senile No date: Degenerative disc disease, cervical No date: Depression No date: Diverticulosis No date: Environmental allergies No date: Exostosis No date: Fibrocystic breast disease No date: Fibrocystic breast disease No date: Gastritis No date: GERD (gastroesophageal reflux disease) No date: Hallux valgus No date: HH (hiatus hernia) No date: History of degenerative disc disease No date: History of shingles No date: Hypercholesterolemia No date: Hyperlipidemia No date: Hypertension No date: Internal hemorrhoids No date: Menopausal syndrome No date: Recurrent sinus infections No date: Vaginitis   Reproductive/Obstetrics                             Anesthesia Physical Anesthesia Plan  ASA: II  Anesthesia Plan: General   Post-op  Pain Management:    Induction: Intravenous  PONV Risk Score and Plan: 2 and Propofol infusion  Airway Management Planned: Natural Airway  Additional Equipment:   Intra-op Plan:   Post-operative Plan:   Informed Consent: I have reviewed the patients History and Physical, chart, labs and discussed the procedure including the risks, benefits and alternatives for the proposed anesthesia with the patient or authorized representative who has indicated his/her understanding and acceptance.   Dental advisory given  Plan Discussed with: CRNA and Anesthesiologist  Anesthesia Plan Comments:         Anesthesia Quick Evaluation

## 2018-02-20 NOTE — Anesthesia Postprocedure Evaluation (Signed)
Anesthesia Post Note  Patient: Ernst SpellSylvia R Birnbaum  Procedure(s) Performed: ESOPHAGOGASTRODUODENOSCOPY (EGD) WITH PROPOFOL (N/A )  Patient location during evaluation: Endoscopy Anesthesia Type: General Level of consciousness: awake and alert and oriented Pain management: pain level controlled Vital Signs Assessment: post-procedure vital signs reviewed and stable Respiratory status: spontaneous breathing, nonlabored ventilation and respiratory function stable Cardiovascular status: blood pressure returned to baseline and stable Postop Assessment: no signs of nausea or vomiting Anesthetic complications: no     Last Vitals:  Vitals:   02/20/18 1337 02/20/18 1347  BP: (!) 142/94 (!) 111/96  Pulse: 71 68  Resp: 17 18  Temp:    SpO2: 99% 96%    Last Pain:  Vitals:   02/20/18 1337  TempSrc: Tympanic                 Khaliya Golinski

## 2018-02-20 NOTE — Anesthesia Post-op Follow-up Note (Signed)
Anesthesia QCDR form completed.        

## 2018-02-20 NOTE — Transfer of Care (Signed)
Immediate Anesthesia Transfer of Care Note  Patient: Rachel SpellSylvia R Fraleigh  Procedure(s) Performed: ESOPHAGOGASTRODUODENOSCOPY (EGD) WITH PROPOFOL (N/A )  Patient Location: PACU and Endoscopy Unit  Anesthesia Type:General  Level of Consciousness: drowsy  Airway & Oxygen Therapy: Patient Spontanous Breathing and Patient connected to nasal cannula oxygen  Post-op Assessment: Report given to RN and Post -op Vital signs reviewed and stable  Post vital signs: Reviewed and stable  Last Vitals:  Vitals:   02/20/18 1156  BP: (!) 158/63  Pulse: 60  Resp: 16  Temp: (!) 36.3 C  SpO2: 100%    Last Pain:  Vitals:   02/20/18 1156  TempSrc: Tympanic      Patients Stated Pain Goal: 10 (02/20/18 1156)  Complications: No apparent anesthesia complications

## 2018-02-20 NOTE — Op Note (Addendum)
[facility] Gastroenterology Patient Name: Rachel Harris Procedure Date: 02/20/2018 1:07 PM MRN: [id number] Account #: [id number] Date of Birth: 05-07-1935 Admit Type: Outpatient Age: 83 Room: [facility] ENDO ROOM 3 Gender: Female Note Status: Finalized Procedure:            Upper GI endoscopy Indications:          Follow-up of Barrett's esophagus Providers:            Christena Deem, MD Referring MD:         Duane Lope. Judithann Sheen, MD (Referring MD) Medicines:            Monitored Anesthesia Care Complications:        No immediate complications. Procedure:            Pre-Anesthesia Assessment:                       - ASA Grade Assessment: II - A patient with mild                        systemic disease.                       After obtaining informed consent, the endoscope was                        passed under direct vision. Throughout the procedure,                        the patient's blood pressure, pulse, and oxygen                        saturations were monitored continuously. The Endoscope                        was introduced through the mouth, and advanced to the                        third part of duodenum. The upper GI endoscopy was                        accomplished without difficulty. The patient tolerated                        the procedure well. Findings:      There were esophageal mucosal changes consistent with short-segment       Barrett's esophagus present in the distal esophagus. The maximum       longitudinal extent of these mucosal changes was 1 cm in length. Mucosa       was biopsied with a cold forceps for histology in 4 quadrants at 38 cm       from the incisors. One specimen bottle was sent to pathology.      The exam of the esophagus was otherwise normal.      Patchy mild inflammation characterized by congestion (edema) and       erythema was found in the gastric body. Biopsies were taken with a cold       forceps for histology. Biopsies  were taken with a cold forceps for       Helicobacter pylori testing.      Evidence of a Nissen fundoplication was found  in the cardia. The wrap       appeared intact.      The examined duodenum was normal.      A single 2 mm sessile polyp with no bleeding and no stigmata of recent       bleeding was found on the anterior wall of the gastric body. Biopsies       were taken with a cold forceps for histology. Impression:           - Esophageal mucosal changes consistent with                        short-segment Barrett's esophagus. Biopsied.                       - Gastritis. Biopsied.                       - A Nissen fundoplication was found. The wrap appears                        intact.                       - Normal examined duodenum. Recommendation:       - Await pathology results.                       - Continue present medications.                       - Telephone GI clinic for pathology results in 1 week. Procedure Code(s):    --- Professional ---                       [phone], Esophagogastroduodenoscopy, flexible, transoral;                        with biopsy, single or multiple Diagnosis Code(s):    --- Professional ---                       K22.70, Barrett's esophagus without dysplasia                       K29.70, Gastritis, unspecified, without bleeding                       Z98.890, Other specified postprocedural states CPT copyright 2016 American [employer] Association. All rights reserved. The codes documented in this report are preliminary and upon coder review may  be revised to meet current compliance requirements. Christena Deem, MD 02/20/2018 1:38:33 PM This report has been signed electronically. Number of Addenda: 0 Note Initiated On: 02/20/2018 1:07 PM      [facility]

## 2018-02-20 NOTE — H&P (Signed)
Outpatient short stay form Pre-procedure 02/20/2018 1:06 PM Christena DeemMartin U Sony Schlarb MD  Primary Physician: Dr. Aram BeechamJeffrey Sparks  Reason for visit: EGD  History of present illness: Patient is a 83 year old female presenting today as above.  She has personal history of Barrett's esophagus with her last EGD being done in 2016.  She denies any heartburn or dysphagia.  She does have a history of a Nissen fundoplication that was done in 2004.  She does take 81 mg aspirin that is been held for several days.  She takes no other aspirin products or blood thinning agent.    Current Facility-Administered Medications:  .  0.9 %  sodium chloride infusion, , Intravenous, Continuous, Christena DeemSkulskie, Yania Bogie U, MD, Last Rate: 20 mL/hr at 02/20/18 1200 .  0.9 %  sodium chloride infusion, , Intravenous, Continuous, Christena DeemSkulskie, Dyquan Minks U, MD  Medications Prior to Admission  Medication Sig Dispense Refill Last Dose  . amitriptyline (ELAVIL) 25 MG tablet Take 25 mg by mouth at bedtime.   Past Week at Unknown time  . aspirin EC 81 MG tablet Take 81 mg by mouth daily.   Past Week at Unknown time  . estradiol (ESTRACE) 1 MG tablet Take 1 mg by mouth daily.   Past Week at Unknown time  . gentamicin ointment (GARAMYCIN) 0.1 % Apply 1 application topically 3 (three) times daily.   Past Week at Unknown time  . Multiple Vitamins-Minerals (MULTIVITAMIN WITH MINERALS) tablet Take 1 tablet by mouth daily.   Past Week at Unknown time  . omeprazole (PRILOSEC) 20 MG capsule Take 20 mg by mouth daily.   Past Week at Unknown time  . triamcinolone cream (KENALOG) 0.1 % Apply 1 application topically 2 (two) times daily.   Past Week at Unknown time     Allergies  Allergen Reactions  . Amoxicillin   . Ceclor [Cefaclor]   . Codeine   . Erythromycin   . Levaquin [Levofloxacin In D5w]   . Macrodantin [Nitrofurantoin Macrocrystal]   . Zithromax [Azithromycin]      Past Medical History:  Diagnosis Date  . Barrett esophagus   . Cataract  cortical, senile   . Degenerative disc disease, cervical   . Depression   . Diverticulosis   . Environmental allergies   . Exostosis   . Fibrocystic breast disease   . Fibrocystic breast disease   . Gastritis   . GERD (gastroesophageal reflux disease)   . Hallux valgus   . HH (hiatus hernia)   . History of degenerative disc disease   . History of shingles   . Hypercholesterolemia   . Hyperlipidemia   . Hypertension   . Internal hemorrhoids   . Menopausal syndrome   . Recurrent sinus infections   . Vaginitis     Review of systems:      Physical Exam    Heart and lungs: Regular rate and rhythm without rub or gallop, lungs are bilaterally clear.    HEENT: Normocephalic atraumatic eyes are anicteric    Other:    Pertinant exam for procedure: Soft nontender nondistended bowel sounds positive normoactive.    Planned proceedures: EGD and indicated procedures. I have discussed the risks benefits and complications of procedures to include not limited to bleeding, infection, perforation and the risk of sedation and the patient wishes to proceed.    Christena DeemMartin U Mikailah Morel, MD Gastroenterology 02/20/2018  1:06 PM

## 2018-02-21 ENCOUNTER — Encounter: Payer: Self-pay | Admitting: Gastroenterology

## 2018-02-21 LAB — SURGICAL PATHOLOGY

## 2018-04-25 ENCOUNTER — Other Ambulatory Visit: Payer: Self-pay | Admitting: Internal Medicine

## 2018-04-30 ENCOUNTER — Other Ambulatory Visit: Payer: Self-pay | Admitting: Internal Medicine

## 2018-04-30 DIAGNOSIS — Z1231 Encounter for screening mammogram for malignant neoplasm of breast: Secondary | ICD-10-CM

## 2018-05-16 ENCOUNTER — Ambulatory Visit
Admission: RE | Admit: 2018-05-16 | Discharge: 2018-05-16 | Disposition: A | Discharge status: Home / Self Care | Payer: Medicare Other | Source: Ambulatory Visit | Attending: Internal Medicine | Admitting: Internal Medicine

## 2018-05-16 DIAGNOSIS — Z1231 Encounter for screening mammogram for malignant neoplasm of breast: Secondary | ICD-10-CM | POA: Diagnosis present

## 2018-10-04 ENCOUNTER — Other Ambulatory Visit: Payer: Self-pay | Admitting: Internal Medicine

## 2018-10-04 DIAGNOSIS — M5441 Lumbago with sciatica, right side: Secondary | ICD-10-CM

## 2018-10-14 ENCOUNTER — Ambulatory Visit
Admission: RE | Admit: 2018-10-14 | Discharge: 2018-10-14 | Disposition: A | Discharge status: Home / Self Care | Payer: Medicare Other | Source: Ambulatory Visit | Attending: Internal Medicine | Admitting: Internal Medicine

## 2018-10-14 DIAGNOSIS — M5441 Lumbago with sciatica, right side: Secondary | ICD-10-CM | POA: Diagnosis present

## 2018-10-14 DIAGNOSIS — M4316 Spondylolisthesis, lumbar region: Secondary | ICD-10-CM | POA: Insufficient documentation

## 2018-10-14 DIAGNOSIS — M47816 Spondylosis without myelopathy or radiculopathy, lumbar region: Secondary | ICD-10-CM | POA: Diagnosis not present

## 2019-01-17 ENCOUNTER — Other Ambulatory Visit: Payer: Self-pay | Admitting: Gastroenterology

## 2019-01-17 DIAGNOSIS — R1031 Right lower quadrant pain: Secondary | ICD-10-CM

## 2019-01-24 ENCOUNTER — Ambulatory Visit
Admission: RE | Admit: 2019-01-24 | Discharge: 2019-01-24 | Disposition: A | Discharge status: Home / Self Care | Payer: Medicare Other | Source: Ambulatory Visit | Attending: Gastroenterology | Admitting: Gastroenterology

## 2019-01-24 DIAGNOSIS — R1031 Right lower quadrant pain: Secondary | ICD-10-CM | POA: Diagnosis present

## 2019-01-24 LAB — POCT I-STAT CREATININE: Creatinine, Ser: 0.8 mg/dL (ref 0.44–1.00)

## 2019-01-24 MED ORDER — IOPAMIDOL (ISOVUE-300) INJECTION 61%
100.0000 mL | Freq: Once | INTRAVENOUS | Status: AC | PRN
  Administered 2019-01-24: 100 mL via INTRAVENOUS

## 2019-03-12 ENCOUNTER — Ambulatory Visit: Payer: Medicare Other | Attending: Gastroenterology | Admitting: Physical Therapy

## 2019-03-12 ENCOUNTER — Encounter: Payer: Self-pay | Admitting: Physical Therapy

## 2019-03-12 ENCOUNTER — Other Ambulatory Visit: Payer: Self-pay

## 2019-03-12 DIAGNOSIS — R278 Other lack of coordination: Secondary | ICD-10-CM | POA: Insufficient documentation

## 2019-03-12 DIAGNOSIS — R293 Abnormal posture: Secondary | ICD-10-CM | POA: Diagnosis not present

## 2019-03-12 DIAGNOSIS — M6281 Muscle weakness (generalized): Secondary | ICD-10-CM | POA: Insufficient documentation

## 2019-03-12 NOTE — Therapy (Signed)
[facility] [facility] MAIN [facility] SERVICES [address] [place], [place], [address] Phone: [phone]   Fax:  [phone]  Physical Therapy Evaluation  Patient Details  Name: Rachel Harris MRN: [id number] Date of Birth: 08/31/1935 Referring Provider (PT): Barnetta Chapel, MD   Encounter Date: 03/12/2019  PT End of Session - 03/12/19 1235    Visit Number  1    Number of Visits  13    Date for PT Re-Evaluation  06/04/19    Authorization Type  1/10 (IE 03/12/2019)    PT Start Time  1255    PT Stop Time  1410    PT Time Calculation (min)  75 min    Activity Tolerance  Patient tolerated treatment well;No increased pain    Behavior During Therapy  WFL for tasks assessed/performed       Past Medical History:  Diagnosis Date  . Barrett esophagus   . Cataract cortical, senile   . Degenerative disc disease, cervical   . Depression   . Diverticulosis   . Environmental allergies   . Exostosis   . Fibrocystic breast disease   . Fibrocystic breast disease   . Gastritis   . GERD (gastroesophageal reflux disease)   . Hallux valgus   . HH (hiatus hernia)   . History of degenerative disc disease   . History of shingles   . Hypercholesterolemia   . Hyperlipidemia   . Hypertension   . Internal hemorrhoids   . Menopausal syndrome   . Recurrent sinus infections   . Vaginitis     Past Surgical History:  Procedure Laterality Date  . ABDOMINAL HYSTERECTOMY    . APPENDECTOMY    . BREAST BIOPSY Left 2015   neg- core  . BREAST CYST ASPIRATION Left 2015   neg  . BREAST SURGERY     biopsy  . CATARACT EXTRACTION W/ INTRAOCULAR LENS IMPLANTW/ TRABECULECTOMY    . CHOLECYSTECTOMY    . COLONOSCOPY WITH PROPOFOL N/A 08/25/2015   Procedure: COLONOSCOPY WITH PROPOFOL;  Surgeon: Christena Deem, MD;  Location: [facility] ENDOSCOPY;  Service: Endoscopy;  Laterality: N/A;  . ESOPHAGOGASTRODUODENOSCOPY (EGD) WITH PROPOFOL N/A 08/25/2015   Procedure:  ESOPHAGOGASTRODUODENOSCOPY (EGD) WITH PROPOFOL;  Surgeon: Christena Deem, MD;  Location: [facility] ENDOSCOPY;  Service: Endoscopy;  Laterality: N/A;  . ESOPHAGOGASTRODUODENOSCOPY (EGD) WITH PROPOFOL N/A 02/20/2018   Procedure: ESOPHAGOGASTRODUODENOSCOPY (EGD) WITH PROPOFOL;  Surgeon: Christena Deem, MD;  Location: [facility] ENDOSCOPY;  Service: Endoscopy;  Laterality: N/A;  . HERNIA REPAIR    . NISSEN FUNDOPLICATION    . TONSILLECTOMY      There were no vitals filed for this visit.    [facility] PT Assessment - 03/12/19 0001      Assessment   Medical Diagnosis  Constipation    Referring Provider (PT)  Barnetta Chapel, MD    Onset Date/Surgical Date  09/12/19    Hand Dominance  Right    Next MD Visit  03/26/2019    Prior Therapy  not for this issue      Precautions   Precautions  None      Restrictions   Weight Bearing Restrictions  No      Balance Screen   Has the patient fallen in the past 6 months  No      Prior Function   Level of Independence  Independent        Pelvic Floor Physical Therapy Evaluation and Assessment  SCREENING  Red Flags: denies all Have you  had any night sweats? Unexplained weight loss? Saddle anesthesia? Unexplained changes in bowel or bladder habits?  SUBJECTIVE  Patient reports: Back pain has occurred for years and responds well to rest (sitting for 30 minutes); worst pain in the past week (7/10), best pain (3/10). The bowel changes became worse 6 months ago when patient was going 1x/4 days when she would have a very large void. Patient has been taking Mylanta to encourage BMs. Patient was feeling pressure and have popping with defecation. Patient is also having some smearing and leakage with flatulence. Patient was told by provider that she has an extra long colon. Patient has internal hemorrhoids and has had issues with this since her first child. Patient had two daughters, both vaginal deliveries, episiotomy with first. History of straining with  BM; none currently. Patient reports "the muscles are doing their thing and it's not much effort." Patient struggles with maintaining stream during urination; reports weak stream unless it is first thing in the morning. Patient has moderate incontinence with positions changes against gravity. Patient reports feeling like her "colon has dropped" and that is what is making her bowels not right.  BOWEL Frequency? 6x/day for the smearing;  [place] Stool Chart? Type 6;  Urgency? Y Incontinence? Smearing/Flatus Fluid Intake? H20: 48 oz ; Caffeinated beverages: decaf tea inconsistently; Other: orange juice with mylanta Typical dietary intake? AM: bacon and eggs with biscuit/ yogurt with breakfast bar and fruit; LUNCH: banana sandwich, chips, fruit, cookie; PM: popcorn snack Anal/rectal pain? Y (inconsistent shooting pain) Blood in stool? N Toileting position? Elevated toilet seat Do you believe pelvic health PT will help? Patient is unsure; patient states "I don't know. It's not going to put my colon back in the right place."  Patient Goals: Pain relief, no bladder infection, prevent need for surgical intervention on the colon   OBJECTIVE  Posture/Observations: Slumped, deep knee flexion, plantarflexed feet. Sitting: Slumped, deep knee flexion, plantarflexed feet. Standing: L iliac crest is substantially higher than R; R lumbar curve. 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   Strength Testing: MMT  RLE LLE  Hip Flexion 4- 4  Hip Abduction  4- 3+  Hip Adduction  4- 4-  Knee Extension 4+ 4+  Knee Flexion 4+ 4+  Dorsiflexion  5 5  Plantarflexion (seated) 5 5    Abdominal:  Palpation: TTP over RLQ just medial to iliac crest. Scar restrictions at RUQ and LLQ.  Pelvic Floor External Exam: Palpation: no TTP Cough: paradoxical Coordination with breath: none apparent Contract: patient recruits gluteal musculature in order to contract  PFM Lengthen: patient contracts initially and then performs Valsalva and over-utilizes rectus abdominis to initiate lengthening. Lengthening not appreciable at this visit.  Internal Vaginal Exam: deferred on this date 2/2 to hx taking Introitus appears: Strength (PERF):  Symmetry: Palpation: Prolapse:   Internal Rectal Exam: deferred on this date 2/2 to hx taking Strength (PERF): Symmetry: Palpation: Prolapse:   Gait Analysis: Trendelenburg gait R, decreased hip extension   Pelvic Floor Outcome Measures: PFDI-20, PFIQ-7  Patient educated on prognosis, POC, and provided with HEP including: toileting posture, colonic massage, and diaphragmatic breathing. Patient articulated understanding and returned demonstration. Patient will benefit from further education in order to maximize compliance and understanding for long-term therapeutic gains.    Objective measurements completed on examination: See above findings.   ASSESSMENT  Patient is an 84 year old presenting to clinic with chief complaints of LBP, pressure at the rectum,  difficulty with bowel movements with smearing, and urinary incontinence with gradient changes. Upon examination, patient demonstrates deficits in posture, pain, coordination, strength, and scar mobility as evidenced by L iliac crest elevation, persistent LBP (7/10), PFM activity paradoxical with coughing, increased recruitment of neighboring musculature for PFM contraction and lengthening, decreased fascial mobility at scar sites in RUQ and LLQ. Patient's progress may be limited due to decreased confidence in ability of physical therapy to help with her present concerns, chronicity of LBP, and dietary habits ; however, patient's medical compliance is advantageous. Patient was able to achieve understanding of colonic massage during today's evaluation and responded positively to educational interventions. Patient will benefit from continued skilled therapeutic  intervention to address deficits in posture, pain, coordination, strength, and scar mobility in order to improve bowel and bladder elimination, increase function, and improve overall QOL.    PT Long Term Goals - 03/12/19 1528      PT LONG TERM GOAL #1   Title  Patient will report BMs classified as Type 3-Type 4 on the [facility] Stool Chart greater than 75% of the time to demonstrate improved motility and stool bulking in order to decrease fecal distress and improve overall QOL.    Baseline  IE: 100% Type 6    Time  12    Period  Weeks    Status  New    Target Date  06/04/19      PT LONG TERM GOAL #2   Title  Patient will indicate at least a 36 point difference on the PFIQ-7 short form to demonstrate clinically significant improvement for a restoration/improvement in function at home and in the community.    Baseline  IE: provided    Time  12    Period  Weeks    Status  New    Target Date  06/04/19      PT LONG TERM GOAL #3   Title  Patient will indicate at least a 45 point difference on the PFDI-20 form to demonstrate clinically significant improvement of PFM for a return to PLOF at home and in the community.    Baseline  IE: provided    Time  12    Period  Weeks    Status  New    Target Date  06/04/19      PT LONG TERM GOAL #4   Title  Patient will report >2 weeks without episode of fecal smearing for improved function and participation at home and in the community.    Baseline  IE: daily smearing    Time  12    Period  Weeks    Status  New    Target Date  06/04/19      PT LONG TERM GOAL #5   Title  Patient will report less than 3 incidents of stress urinary incontinence over the course of a week while changing positions against gravity in order to demonstrate improved PFM coordination, strength, and function for improved overall QOL.    Baseline  IE: multiple times/day during sit to stand    Time  12    Period  Weeks    Status  New    Target Date  06/04/19       Additional Long Term Goals   Additional Long Term Goals  Yes      PT LONG TERM GOAL #6   Title  Patient will decrease worst pain as reported on NPRS by at least 2 points to demonstrate clinically significant reduction in  pain in order to restore/improve function and overall QOL.    Baseline  IE: 7/10     Time  12    Period  Weeks    Status  New    Target Date  06/04/19             Plan - 03/12/19 1237    Clinical Impression Statement  Patient is an 84 year old presenting to clinic with chief complaints of LBP, pressure at the rectum, difficulty with bowel movements with smearing, and urinary incontinence with gradient changes. Upon examination, patient demonstrates deficits in posture, pain, coordination, strength, and scar mobility as evidenced by L iliac crest elevation, persistent LBP (7/10), PFM activity paradoxical with coughing, increased recruitment of neighboring musculature for PFM contraction and lengthening, decreased fascial mobility at scar sites in RUQ and LLQ. Patient's progress may be limited due to decreased confidence in ability of physical therapy to help with her present concerns, chronicity of LBP, and dietary habits ; however, patient's medical compliance is advantageous. Patient was able to achieve understanding of colonic massage during today's evaluation and responded positively to educational interventions. Patient will benefit from continued skilled therapeutic intervention to address deficits in posture, pain, coordination, strength, and scar mobility in order to improve bowel and bladder elimination, increase function, and improve overall QOL.    Personal Factors and Comorbidities  Comorbidity 3+;Age;Time since onset of injury/illness/exacerbation    Comorbidities  HTN, hyperlipidemia, diverticulosis, gastritis, GERD, OA    Examination-Activity Limitations  Continence;Toileting;Stand;Squat;Locomotion Level;Carry;Caring for Others;Bend;Lift;Stairs     Examination-Participation Restrictions  Church;Shop;Cleaning;Yard Work;Community Activity    Stability/Clinical Decision Making  Evolving/Moderate complexity    Clinical Decision Making  Moderate    Rehab Potential  Good    PT Frequency  1x / week    PT Duration  12 weeks    PT Treatment/Interventions  ADLs/Self Care Home Management;Biofeedback;Aquatic Therapy;Cryotherapy;Electrical Stimulation;Moist Heat;Gait training;Stair training;Functional mobility training;Therapeutic activities;Therapeutic exercise;Balance training;Neuromuscular re-education;Patient/family education;Scar mobilization;Manual techniques;Dry needling;Taping    PT Next Visit Plan  thoracolumbar mobility, scar release, diaphragmatic breathing    PT Home Exercise Plan  toileting posture, colon massage    Consulted and Agree with Plan of Care  Patient       Patient will benefit from skilled therapeutic intervention in order to improve the following deficits and impairments:  Abnormal gait, Increased fascial restricitons, Improper body mechanics, Pain, Decreased coordination, Decreased scar mobility, Postural dysfunction, Decreased strength, Decreased range of motion, Decreased endurance, Difficulty walking  Visit Diagnosis: Abnormal posture  Muscle weakness (generalized)  Other lack of coordination     Problem List There are no active problems to display for this patient.  Sheria Lang PT, DPT [phone] 03/12/2019, 3:35 PM  [place] [facility] MAIN [facility] SERVICES [address] [place], [place], [address] Phone: [phone]   Fax:  [phone]  Name: Rachel Harris MRN: [id number] Date of Birth: [birth year]

## 2019-03-18 ENCOUNTER — Encounter: Payer: Self-pay | Admitting: Physical Therapy

## 2019-03-18 NOTE — Therapy (Signed)
[place] [facility] MAIN [facility] SERVICES [address] [place], [place], [address] Phone: [phone]   Fax:  [phone]  Patient Details  Name: Rachel Harris MRN: [id number] Date of Birth: [birth year] Referring Provider:  No ref. provider found  Encounter Date: 03/18/2019  DPT called patient to let patient know the status of the clinic and ensure that we will be reaching out to schedule when we re-open. DPT fielded any questions patient had at this time and offered guidance on current home program as necessary. Patient reported she has already had some success with changing her toileting posture and incorporating the colon massage. Patient had no further questions at this time, and DPT advised that should questions arise patient can reach out to the clinic for guidance via phone or DPT email.  Sheria Lang PT, DPT [phone] 03/18/2019, 2:29 PM  [place] [facility] MAIN [facility] SERVICES [address] [place], [place], [address] Phone: [phone]   Fax:  [phone]

## 2019-03-19 ENCOUNTER — Ambulatory Visit: Payer: Medicare Other

## 2019-05-16 ENCOUNTER — Encounter: Payer: Self-pay | Admitting: Physical Therapy

## 2019-05-16 ENCOUNTER — Other Ambulatory Visit: Payer: Self-pay

## 2019-05-16 ENCOUNTER — Ambulatory Visit: Payer: Medicare Other | Attending: Gastroenterology | Admitting: Physical Therapy

## 2019-05-16 DIAGNOSIS — M6281 Muscle weakness (generalized): Secondary | ICD-10-CM | POA: Insufficient documentation

## 2019-05-16 DIAGNOSIS — R278 Other lack of coordination: Secondary | ICD-10-CM | POA: Insufficient documentation

## 2019-05-16 DIAGNOSIS — R293 Abnormal posture: Secondary | ICD-10-CM

## 2019-05-16 NOTE — Therapy (Signed)
[facility] [address] [place], [place], [address] Phone: [phone]   Fax:  [phone]  Physical Therapy Treatment  Patient Details  Name: Rachel Harris MRN: [id number] Date of Birth: Nov 10, 1935 Referring Provider (PT): Barnetta Chapel, MD   Encounter Date: 05/16/2019  PT End of Session - 05/16/19 1255    Visit Number  2    Number of Visits  13    Date for PT Re-Evaluation  06/04/19    Authorization Type  2/10 (IE 03/12/2019)    PT Start Time  1256    PT Stop Time  1349    PT Time Calculation (min)  53 min    Activity Tolerance  Patient tolerated treatment well;No increased pain    Behavior During Therapy  WFL for tasks assessed/performed       Past Medical History:  Diagnosis Date  . Barrett esophagus   . Cataract cortical, senile   . Degenerative disc disease, cervical   . Depression   . Diverticulosis   . Environmental allergies   . Exostosis   . Fibrocystic breast disease   . Fibrocystic breast disease   . Gastritis   . GERD (gastroesophageal reflux disease)   . Hallux valgus   . HH (hiatus hernia)   . History of degenerative disc disease   . History of shingles   . Hypercholesterolemia   . Hyperlipidemia   . Hypertension   . Internal hemorrhoids   . Menopausal syndrome   . Recurrent sinus infections   . Vaginitis     Past Surgical History:  Procedure Laterality Date  . ABDOMINAL HYSTERECTOMY    . APPENDECTOMY    . BREAST BIOPSY Left 2015   neg- core  . BREAST CYST ASPIRATION Left 2015   neg  . BREAST SURGERY     biopsy  . CATARACT EXTRACTION W/ INTRAOCULAR LENS IMPLANTW/ TRABECULECTOMY    . CHOLECYSTECTOMY    . COLONOSCOPY WITH PROPOFOL N/A 08/25/2015   Procedure: COLONOSCOPY WITH PROPOFOL;  Surgeon: Christena Deem, MD;  Location: [facility] ENDOSCOPY;  Service: Endoscopy;  Laterality: N/A;  . ESOPHAGOGASTRODUODENOSCOPY (EGD) WITH PROPOFOL N/A 08/25/2015   Procedure: ESOPHAGOGASTRODUODENOSCOPY  (EGD) WITH PROPOFOL;  Surgeon: Christena Deem, MD;  Location: [facility] ENDOSCOPY;  Service: Endoscopy;  Laterality: N/A;  . ESOPHAGOGASTRODUODENOSCOPY (EGD) WITH PROPOFOL N/A 02/20/2018   Procedure: ESOPHAGOGASTRODUODENOSCOPY (EGD) WITH PROPOFOL;  Surgeon: Christena Deem, MD;  Location: [facility] ENDOSCOPY;  Service: Endoscopy;  Laterality: N/A;  . HERNIA REPAIR    . NISSEN FUNDOPLICATION    . TONSILLECTOMY      There were no vitals filed for this visit.  Subjective Assessment - 05/16/19 1259    Subjective  Patient continues to have back pain that responds well to rest when she overuses it. Patient states that incomplete bladder emptying that is one the priorrity issues. Patient has increased lower abdominal discomfort.     How long can you stand comfortably?  15 min    Currently in Pain?  Yes    Pain Score  1     Pain Location  Back    Pain Orientation  Lower;Medial    Pain Descriptors / Indicators  Nagging    Pain Onset  More than a month ago       TREATMENT  Pre-treatment assessment: L iliac crest > 1 in higher than R. No obliquity noted. Leg length discrepancy measured to be > 1 in on this date (LLE 34.5 in, RLE 32.5  in)  Neuromuscular Re-education: Supine hooklying diaphragmatic breathing with VCs and TCs for downregulation of the nervous system and improved management of IAP Sidelying, thoracolumbar rotations (open-book) bilaterally with diaphragmatic breathing for improved diaphragmatic and rib cage excursion. VCs and TCs to prevent compensations. Supine hooklying, lower trunk rotations bilaterally with diaphragmatic breathing for improved spinal mobility. VCs and TCs to prevent compensations. Supine posterior pelvic tilts with diaphragmatic breathing for improved core activation and coordination. VCs and TCs to prevent compensations. Articulating bridge with diaphragmatic breathing for improved pelvic organ positioning and PFM coordination. VCs and TCs to prevent  compensations.  Heel lift placed in R shoe to accommodate for leg length discrepancy. Gait with heel lift to assess comfort, utility, alignment and mechanics.   Post-treatment assessment: With heel lift, iliac crests approaching symmetrical.  Patient educated throughout session on appropriate technique and form using multi-modal cueing, HEP, and activity modification. Patient articulated understanding and returned demonstration.  Patient Response to interventions: Patient reports high confidence in ability to perform HEP and expressed improved body awareness with heel lift.  ASSESSMENT Patient presents to clinic with excellent motivation to participate in therapy. Patient demonstrates deficits in posture, spinal mobility, coordination, and PFM function as evidenced by elevated L iliac crest, reliance on VCs and TCs for completion of neuromuscular re-education activities, and continued complaints of incomplete emptying and increased pressure in lower abdominal/pelvic region. Patient able to achieve appropriate breathing mechanics during today's session and responded positively to active interventions. Patient will benefit from continued skilled therapeutic intervention to address remaining deficits in posture, spinal mobility, coordination, and PFM function in order to increase function, and improve overall QOL.      PT Short Term Goals - 03/12/19 1527      PT SHORT TERM GOAL #1   Title  Patient will demonstrate independence with HEP in order to maximize therapeutic gains and improve carryover from physical therapy sessions to ADLs in the home and community.    Baseline  IE: provided    Time  6    Period  Weeks    Status  New    Target Date  04/23/19      PT SHORT TERM GOAL #2   Title  Patient will demonstrate independent and coordinated diaphragmatic breathing in supine with a 1:2 breathing pattern for improved down-regulation of the nervous system and improved management of  intra-abdominal pressures in order to increase function at home and in the community.    Baseline  IE: not coordinated    Time  6    Period  Weeks    Status  New    Target Date  04/23/19      PT SHORT TERM GOAL #3   Title  Patient will demonstrate improved toileting posture with knees higher than hips and feet supported, and will report straining <3x/week with bowel movements in order to decrease incidence of constipation/hemorrhoids/mismanagement of intra-abdominal pressure and improve overall QOL.    Baseline  IE: elevated toilet seat     Time  6    Period  Weeks    Status  New    Target Date  04/23/19        PT Long Term Goals - 03/12/19 1528      PT LONG TERM GOAL #1   Title  Patient will report BMs classified as Type 3-Type 4 on the [facility] Stool Chart greater than 75% of the time to demonstrate improved motility and stool bulking in order to decrease fecal distress and  improve overall QOL.    Baseline  IE: 100% Type 6    Time  12    Period  Weeks    Status  New    Target Date  06/04/19      PT LONG TERM GOAL #2   Title  Patient will indicate at least a 36 point difference on the PFIQ-7 short form to demonstrate clinically significant improvement for a restoration/improvement in function at home and in the community.    Baseline  IE: provided    Time  12    Period  Weeks    Status  New    Target Date  06/04/19      PT LONG TERM GOAL #3   Title  Patient will indicate at least a 45 point difference on the PFDI-20 form to demonstrate clinically significant improvement of PFM for a return to PLOF at home and in the community.    Baseline  IE: provided    Time  12    Period  Weeks    Status  New    Target Date  06/04/19      PT LONG TERM GOAL #4   Title  Patient will report >2 weeks without episode of fecal smearing for improved function and participation at home and in the community.    Baseline  IE: daily smearing    Time  12    Period  Weeks    Status  New     Target Date  06/04/19      PT LONG TERM GOAL #5   Title  Patient will report less than 3 incidents of stress urinary incontinence over the course of a week while changing positions against gravity in order to demonstrate improved PFM coordination, strength, and function for improved overall QOL.    Baseline  IE: multiple times/day during sit to stand    Time  12    Period  Weeks    Status  New    Target Date  06/04/19      Additional Long Term Goals   Additional Long Term Goals  Yes      PT LONG TERM GOAL #6   Title  Patient will decrease worst pain as reported on NPRS by at least 2 points to demonstrate clinically significant reduction in pain in order to restore/improve function and overall QOL.    Baseline  IE: 7/10     Time  12    Period  Weeks    Status  New    Target Date  06/04/19            Plan - 05/16/19 1613    Clinical Impression Statement  Patient presents to clinic with excellent motivation to participate in therapy. Patient demonstrates deficits in posture, spinal mobility, coordination, and PFM function as evidenced by elevated L iliac crest, reliance on VCs and TCs for completion of neuromuscular re-education activities, and continued complaints of incomplete emptying and increased pressure in lower abdominal/pelvic region. Patient able to achieve appropriate breathing mechanics during today's session and responded positively to active interventions. Patient will benefit from continued skilled therapeutic intervention to address remaining deficits in posture, spinal mobility, coordination, and PFM function in order to increase function, and improve overall QOL.    Personal Factors and Comorbidities  Comorbidity 3+;Age;Time since onset of injury/illness/exacerbation    Comorbidities  HTN, hyperlipidemia, diverticulosis, gastritis, GERD, OA    Examination-Activity Limitations  Continence;Toileting;Stand;Squat;Locomotion Level;Carry;Caring for Others;Bend;Lift;Stairs     Examination-Participation Restrictions  Church;Shop;Cleaning;Yard Work;Community Activity    Stability/Clinical Decision Making  Evolving/Moderate complexity    Rehab Potential  Good    PT Frequency  1x / week    PT Duration  12 weeks    PT Treatment/Interventions  ADLs/Self Care Home Management;Biofeedback;Aquatic Therapy;Cryotherapy;Electrical Stimulation;Moist Heat;Gait training;Stair training;Functional mobility training;Therapeutic activities;Therapeutic exercise;Balance training;Neuromuscular re-education;Patient/family education;Scar mobilization;Manual techniques;Dry needling;Taping    PT Next Visit Plan  progress spinal mobility, side release    PT Home Exercise Plan  pelvic tilts, breathing, bridging, HLTR, open book    Consulted and Agree with Plan of Care  Patient       Patient will benefit from skilled therapeutic intervention in order to improve the following deficits and impairments:  Abnormal gait, Increased fascial restricitons, Improper body mechanics, Pain, Decreased coordination, Decreased scar mobility, Postural dysfunction, Decreased strength, Decreased range of motion, Decreased endurance, Difficulty walking  Visit Diagnosis: Abnormal posture  Muscle weakness (generalized)  Other lack of coordination     Problem List There are no active problems to display for this patient.  Sheria Lang PT, DPT [phone] 05/16/2019, 4:28 PM  [facility] [address] [place], [place], [address] Phone: [phone]   Fax:  [phone]  Name: CHESNEY GOLDE MRN: [id number] Date of Birth: 1935-09-19

## 2019-05-23 ENCOUNTER — Ambulatory Visit: Payer: Medicare Other | Admitting: Physical Therapy

## 2019-05-30 ENCOUNTER — Encounter: Payer: Medicare Other | Admitting: Physical Therapy

## 2019-06-04 ENCOUNTER — Other Ambulatory Visit: Payer: Self-pay

## 2019-06-04 ENCOUNTER — Ambulatory Visit: Payer: Medicare Other | Attending: Gastroenterology | Admitting: Physical Therapy

## 2019-06-04 ENCOUNTER — Encounter: Payer: Self-pay | Admitting: Physical Therapy

## 2019-06-04 DIAGNOSIS — R278 Other lack of coordination: Secondary | ICD-10-CM | POA: Diagnosis present

## 2019-06-04 DIAGNOSIS — R293 Abnormal posture: Secondary | ICD-10-CM | POA: Insufficient documentation

## 2019-06-04 DIAGNOSIS — M6281 Muscle weakness (generalized): Secondary | ICD-10-CM | POA: Insufficient documentation

## 2019-06-04 NOTE — Therapy (Signed)
[facility] [address]. [place], [place], [address] Phone: [phone]   Fax:  [phone]  Physical Therapy Treatment  Patient Details  Name: Rachel Harris MRN: [id number] Date of Birth: [birth year] Referring Provider (PT): Loistine Simas, MD   Encounter Date: 06/04/2019  PT End of Session - 06/04/19 1255    Visit Number  3    Number of Visits  21    Date for PT Re-Evaluation  07/30/19    Authorization Type  3/10 (IE 03/12/2019)    PT Start Time  0165    PT Stop Time  1402    PT Time Calculation (min)  64 min    Activity Tolerance  Patient tolerated treatment well;No increased pain    Behavior During Therapy  WFL for tasks assessed/performed       Past Medical History:  Diagnosis Date  . Barrett esophagus   . Cataract cortical, senile   . Degenerative disc disease, cervical   . Depression   . Diverticulosis   . Environmental allergies   . Exostosis   . Fibrocystic breast disease   . Fibrocystic breast disease   . Gastritis   . GERD (gastroesophageal reflux disease)   . Hallux valgus   . HH (hiatus hernia)   . History of degenerative disc disease   . History of shingles   . Hypercholesterolemia   . Hyperlipidemia   . Hypertension   . Internal hemorrhoids   . Menopausal syndrome   . Recurrent sinus infections   . Vaginitis     Past Surgical History:  Procedure Laterality Date  . ABDOMINAL HYSTERECTOMY    . APPENDECTOMY    . BREAST BIOPSY Left 2015   neg- core  . BREAST CYST ASPIRATION Left 2015   neg  . BREAST SURGERY     biopsy  . CATARACT EXTRACTION W/ INTRAOCULAR LENS IMPLANTW/ TRABECULECTOMY    . CHOLECYSTECTOMY    . COLONOSCOPY WITH PROPOFOL N/A 08/25/2015   Procedure: COLONOSCOPY WITH PROPOFOL;  Surgeon: Lollie Sails, MD;  Location: [facility] ENDOSCOPY;  Service: Endoscopy;  Laterality: N/A;  . ESOPHAGOGASTRODUODENOSCOPY (EGD) WITH PROPOFOL N/A 08/25/2015   Procedure: ESOPHAGOGASTRODUODENOSCOPY  (EGD) WITH PROPOFOL;  Surgeon: Lollie Sails, MD;  Location: [facility] ENDOSCOPY;  Service: Endoscopy;  Laterality: N/A;  . ESOPHAGOGASTRODUODENOSCOPY (EGD) WITH PROPOFOL N/A 02/20/2018   Procedure: ESOPHAGOGASTRODUODENOSCOPY (EGD) WITH PROPOFOL;  Surgeon: Lollie Sails, MD;  Location: [facility] ENDOSCOPY;  Service: Endoscopy;  Laterality: N/A;  . HERNIA REPAIR    . NISSEN FUNDOPLICATION    . TONSILLECTOMY      There were no vitals filed for this visit.  Subjective Assessment - 06/04/19 1305    Subjective  Patient states that she continus to have SOB with productive cough for which she has been tested for COVID 19 and the results were negative. Patient also had chest xray which was negative for pneumonia. Patient coninues to have audible congestion but she has started a daily allergy regimen.    How long can you stand comfortably?  15 min    Pain Onset  More than a month ago       TREATMENT  Neuromuscular Re-education: Reassesed goals.  Reviewed HEP.  Provided alternative postures for HEP to allow for better oxygenation in the presence of congestion and increased allergen response.    Patient educated throughout session on appropriate technique and form using multi-modal cueing, HEP, and activity modification. Patient articulated understanding and returned demonstration.  Patient Response  to interventions: Patient reported feeling confident about performing seated HEP.  ASSESSMENT Patient presents to clinic with excellent motivation to participate in therapy. Patient demonstrates deficits in posture, pain, coordination, strength, and scar mobility as evidenced by L iliac crest elevation, persistent LBP (7/10), PFM activity paradoxical with coughing, increased recruitment of neighboring musculature for PFM contraction and lengthening, decreased fascial mobility at scar sites in RUQ and LLQ. Patient able to achieve improved thoracic rotation in seated posture during today's session and  responded positively to educational interventions. Patient's condition has the potential to improve in response to therapy. Maximum improvement is yet to be obtained. The anticipated improvement is attainable and reasonable in a generally predictable time.  Patient will benefit from continued skilled therapeutic intervention to address remaining deficits in posture, pain, coordination, strength, and scar mobility in order to improve bowel and bladder elimination, increase function, and improve overall QOL.    PT Short Term Goals - 06/04/19 1302      PT SHORT TERM GOAL #1   Title  Patient will demonstrate independence with HEP in order to maximize therapeutic gains and improve carryover from physical therapy sessions to ADLs in the home and community.    Baseline  IE: provided; 06/04/2019: 10/10 confidence    Time  6    Period  Weeks    Status  Achieved    Target Date  04/23/19      PT SHORT TERM GOAL #2   Title  Patient will demonstrate independent and coordinated diaphragmatic breathing in supine with a 1:2 breathing pattern for improved down-regulation of the nervous system and improved management of intra-abdominal pressures in order to increase function at home and in the community.    Baseline  IE: not coordinated; 06/04/2019 has been SOB and had difficulty doing HEP    Time  4    Period  Weeks    Status  Partially Met    Target Date  07/02/19      PT SHORT TERM GOAL #3   Title  Patient will demonstrate improved toileting posture with knees higher than hips and feet supported, and will report straining <3x/week with bowel movements in order to decrease incidence of constipation/hemorrhoids/mismanagement of intra-abdominal pressure and improve overall QOL.    Baseline  IE: elevated toilet seat; 06/04/2019: not straining and has elevated foot surface    Time  4    Period  Weeks    Status  Achieved    Target Date  07/02/19        PT Long Term Goals - 06/04/19 1316      PT LONG TERM  GOAL #1   Title  Patient will report BMs classified as Type 3-Type 4 on the [facility] Stool Chart greater than 75% of the time to demonstrate improved motility and stool bulking in order to decrease fecal distress and improve overall QOL.    Baseline  IE: 100% Type 6; 06/04/2019: Type 5-6 whenever patient takes Miralax to support BM frequency    Time  8    Period  Weeks    Status  On-going    Target Date  07/30/19      PT LONG TERM GOAL #2   Title  Patient will indicate at least a 36 point difference on the PFIQ-7 short form to demonstrate clinically significant improvement for a restoration/improvement in function at home and in the community.    Baseline  IE: provided; 06/04/2019: 109.5/300 (57.1% UIQ; 52.4% CRAIQ)    Time  8    Period  Weeks    Status  New    Target Date  07/30/19      PT LONG TERM GOAL #3   Title  Patient will indicate at least a 45 point difference on the PFDI-20 form to demonstrate clinically significant improvement of PFM for a return to PLOF at home and in the community.    Baseline  IE: provided; 06/04/2019: 135.3/300 (UDI 70.8; CRAD 31.25; POPDI 33.25)    Time  8    Period  Weeks    Status  New    Target Date  07/30/19      PT LONG TERM GOAL #4   Title  Patient will report >2 weeks without episode of fecal smearing for improved function and participation at home and in the community.    Baseline  IE: daily smearing; 06/04/2019: intermittent bouts of 2-3 day duration followed by none    Time  8    Period  Weeks    Status  On-going    Target Date  07/30/19      PT LONG TERM GOAL #5   Title  Patient will report less than 3 incidents of stress urinary incontinence over the course of a week while changing positions against gravity in order to demonstrate improved PFM coordination, strength, and function for improved overall QOL.    Baseline  IE: multiple times/day during sit to stand; 06/04/2019: no change    Time  8    Period  Weeks    Status  On-going    Target Date   07/30/19      PT LONG TERM GOAL #6   Title  Patient will decrease worst pain as reported on NPRS by at least 2 points to demonstrate clinically significant reduction in pain in order to restore/improve function and overall QOL.    Baseline  IE: 7/10; 06/04/2019: 5/10    Time  8    Period  Weeks    Status  On-going    Target Date  07/30/19            Plan - 06/04/19 1308    Clinical Impression Statement  Patient presents to clinic with excellent motivation to participate in therapy. Patient demonstrates deficits in posture, pain, coordination, strength, and scar mobility as evidenced by L iliac crest elevation, persistent LBP (7/10), PFM activity paradoxical with coughing, increased recruitment of neighboring musculature for PFM contraction and lengthening, decreased fascial mobility at scar sites in RUQ and LLQ. Patient able to achieve improved thoracic rotation in seated posture during today's session and responded positively to educational interventions. Patient's condition has the potential to improve in response to therapy. Maximum improvement is yet to be obtained. The anticipated improvement is attainable and reasonable in a generally predictable time.  Patient will benefit from continued skilled therapeutic intervention to address remaining deficits in posture, pain, coordination, strength, and scar mobility in order to improve bowel and bladder elimination, increase function, and improve overall QOL.    Personal Factors and Comorbidities  Comorbidity 3+;Age;Time since onset of injury/illness/exacerbation    Comorbidities  HTN, hyperlipidemia, diverticulosis, gastritis, GERD, OA    Examination-Activity Limitations  Continence;Toileting;Stand;Squat;Locomotion Level;Carry;Caring for Others;Bend;Lift;Stairs    Examination-Participation Restrictions  Church;Shop;Cleaning;Yard Work;Community Activity    Stability/Clinical Decision Making  Evolving/Moderate complexity    Rehab Potential  Good     PT Frequency  1x / week    PT Duration  8 weeks    PT Treatment/Interventions  ADLs/Self Care  Home Management;Biofeedback;Aquatic Therapy;Cryotherapy;Electrical Stimulation;Moist Heat;Gait training;Stair training;Functional mobility training;Therapeutic activities;Therapeutic exercise;Balance training;Neuromuscular re-education;Patient/family education;Scar mobilization;Manual techniques;Dry needling;Taping    PT Next Visit Plan  progress spinal mobility, side release    PT Home Exercise Plan  pelvic tilts, breathing, bridging, HLTR, open book    Consulted and Agree with Plan of Care  Patient       Patient will benefit from skilled therapeutic intervention in order to improve the following deficits and impairments:  Abnormal gait, Increased fascial restricitons, Improper body mechanics, Pain, Decreased coordination, Decreased scar mobility, Postural dysfunction, Decreased strength, Decreased range of motion, Decreased endurance, Difficulty walking  Visit Diagnosis: Abnormal posture  Muscle weakness (generalized)  Other lack of coordination     Problem List There are no active problems to display for this patient.  Myles Gip PT, DPT [phone] 06/04/2019, 4:46 PM  [place] [facility] [address] [place], [place], [address] Phone: [phone]   Fax:  [phone]  Name: Rachel Harris MRN: [id number] Date of Birth: 10-Sep-1935

## 2019-06-11 ENCOUNTER — Encounter: Payer: Self-pay | Admitting: Physical Therapy

## 2019-06-11 ENCOUNTER — Ambulatory Visit: Payer: Medicare Other | Admitting: Physical Therapy

## 2019-06-11 ENCOUNTER — Other Ambulatory Visit: Payer: Self-pay

## 2019-06-11 DIAGNOSIS — M6281 Muscle weakness (generalized): Secondary | ICD-10-CM

## 2019-06-11 DIAGNOSIS — R278 Other lack of coordination: Secondary | ICD-10-CM

## 2019-06-11 DIAGNOSIS — R293 Abnormal posture: Secondary | ICD-10-CM | POA: Diagnosis not present

## 2019-06-11 NOTE — Therapy (Signed)
[facility] [address] [place], [place], [address] Phone: [phone]   Fax:  [phone]  Physical Therapy Treatment  Patient Details  Name: Rachel Harris MRN: [id number] Date of Birth: 1935-07-03 Referring Provider (PT): Loistine Simas, MD   Encounter Date: 06/11/2019  PT End of Session - 06/11/19 1255    Visit Number  4    Number of Visits  21    Date for PT Re-Evaluation  07/30/19    Authorization Type  4/10 (IE 03/12/2019)    PT Start Time  1257    PT Stop Time  1350    PT Time Calculation (min)  53 min    Activity Tolerance  Patient tolerated treatment well;No increased pain    Behavior During Therapy  WFL for tasks assessed/performed       Past Medical History:  Diagnosis Date  . Barrett esophagus   . Cataract cortical, senile   . Degenerative disc disease, cervical   . Depression   . Diverticulosis   . Environmental allergies   . Exostosis   . Fibrocystic breast disease   . Fibrocystic breast disease   . Gastritis   . GERD (gastroesophageal reflux disease)   . Hallux valgus   . HH (hiatus hernia)   . History of degenerative disc disease   . History of shingles   . Hypercholesterolemia   . Hyperlipidemia   . Hypertension   . Internal hemorrhoids   . Menopausal syndrome   . Recurrent sinus infections   . Vaginitis     Past Surgical History:  Procedure Laterality Date  . ABDOMINAL HYSTERECTOMY    . APPENDECTOMY    . BREAST BIOPSY Left 2015   neg- core  . BREAST CYST ASPIRATION Left 2015   neg  . BREAST SURGERY     biopsy  . CATARACT EXTRACTION W/ INTRAOCULAR LENS IMPLANTW/ TRABECULECTOMY    . CHOLECYSTECTOMY    . COLONOSCOPY WITH PROPOFOL N/A 08/25/2015   Procedure: COLONOSCOPY WITH PROPOFOL;  Surgeon: Lollie Sails, MD;  Location: [facility] ENDOSCOPY;  Service: Endoscopy;  Laterality: N/A;  . ESOPHAGOGASTRODUODENOSCOPY (EGD) WITH PROPOFOL N/A 08/25/2015   Procedure: ESOPHAGOGASTRODUODENOSCOPY  (EGD) WITH PROPOFOL;  Surgeon: Lollie Sails, MD;  Location: [facility] ENDOSCOPY;  Service: Endoscopy;  Laterality: N/A;  . ESOPHAGOGASTRODUODENOSCOPY (EGD) WITH PROPOFOL N/A 02/20/2018   Procedure: ESOPHAGOGASTRODUODENOSCOPY (EGD) WITH PROPOFOL;  Surgeon: Lollie Sails, MD;  Location: [facility] ENDOSCOPY;  Service: Endoscopy;  Laterality: N/A;  . HERNIA REPAIR    . NISSEN FUNDOPLICATION    . TONSILLECTOMY      There were no vitals filed for this visit.  Subjective Assessment - 06/11/19 1257    Subjective  Patient states that she had a near fall negotiating steps outside her house. She rolled her L ankle, and she has been wrapping it with an ace bandage to keep the swelling down. Patient states that she feels really sore around her hip complex from fighting to stay on her feet. Patient has notable swelling in LLE and she states that she has noticed increased swelling prior ot the near fall.    How long can you stand comfortably?  15 min    Currently in Pain?  Yes    Pain Location  Back    Pain Orientation  Lower    Pain Descriptors / Indicators  Sore    Pain Type  Acute pain    Pain Onset  In the past 7 days  TREATMENT  Manual Therapy: STM/TPR to lumbar paraspinals, gluteal complex, sacral border B, L vastus lateralis   Neuromuscular Re-education: Prone multifidi activation with LE lift x20 each throughout session HLTR for decreased muscle spasm in lumbar region Open book B for decreased muscle spasm in lumbar region   Patient educated throughout session on appropriate technique and form using multi-modal cueing, HEP, and activity modification. Patient articulated understanding and returned demonstration.  Patient Response to interventions: Patient reporting decreased pain in low back/hips.  ASSESSMENT Patient presents to clinic with excellent motivation to participate in therapy. Patient demonstrates deficits in posture, pain, coordination, strength, and scar mobility as  evidenced by L iliac crest elevation, persistent LBP, PFM activity paradoxical with coughing, increased recruitment of neighboring musculature for PFM contraction and lengthening, decreased fascial mobility at scar sites in RUQ and LLQ. Patient able to achieve decreased LBP and spasm during today's session and responded positively to manual interventions. Patient will benefit from continued skilled therapeutic intervention to address remaining deficits in posture, pain, coordination, strength, and scar mobility in order to improve bowel and bladder elimination, increase function, and improve overall QOL.        PT Short Term Goals - 06/04/19 1302      PT SHORT TERM GOAL #1   Title  Patient will demonstrate independence with HEP in order to maximize therapeutic gains and improve carryover from physical therapy sessions to ADLs in the home and community.    Baseline  IE: provided; 06/04/2019: 10/10 confidence    Time  6    Period  Weeks    Status  Achieved    Target Date  04/23/19      PT SHORT TERM GOAL #2   Title  Patient will demonstrate independent and coordinated diaphragmatic breathing in supine with a 1:2 breathing pattern for improved down-regulation of the nervous system and improved management of intra-abdominal pressures in order to increase function at home and in the community.    Baseline  IE: not coordinated; 06/04/2019 has been SOB and had difficulty doing HEP    Time  4    Period  Weeks    Status  Partially Met    Target Date  07/02/19      PT SHORT TERM GOAL #3   Title  Patient will demonstrate improved toileting posture with knees higher than hips and feet supported, and will report straining <3x/week with bowel movements in order to decrease incidence of constipation/hemorrhoids/mismanagement of intra-abdominal pressure and improve overall QOL.    Baseline  IE: elevated toilet seat; 06/04/2019: not straining and has elevated foot surface    Time  4    Period  Weeks     Status  Achieved    Target Date  07/02/19        PT Long Term Goals - 06/04/19 1316      PT LONG TERM GOAL #1   Title  Patient will report BMs classified as Type 3-Type 4 on the [facility] Stool Chart greater than 75% of the time to demonstrate improved motility and stool bulking in order to decrease fecal distress and improve overall QOL.    Baseline  IE: 100% Type 6; 06/04/2019: Type 5-6 whenever patient takes Miralax to support BM frequency    Time  8    Period  Weeks    Status  On-going    Target Date  07/30/19      PT LONG TERM GOAL #2   Title  Patient will indicate at  least a 36 point difference on the PFIQ-7 short form to demonstrate clinically significant improvement for a restoration/improvement in function at home and in the community.    Baseline  IE: provided; 06/04/2019: 109.5/300 (57.1% UIQ; 52.4% CRAIQ)    Time  8    Period  Weeks    Status  New    Target Date  07/30/19      PT LONG TERM GOAL #3   Title  Patient will indicate at least a 45 point difference on the PFDI-20 form to demonstrate clinically significant improvement of PFM for a return to PLOF at home and in the community.    Baseline  IE: provided; 06/04/2019: 135.3/300 (UDI 70.8; CRAD 31.25; POPDI 33.25)    Time  8    Period  Weeks    Status  New    Target Date  07/30/19      PT LONG TERM GOAL #4   Title  Patient will report >2 weeks without episode of fecal smearing for improved function and participation at home and in the community.    Baseline  IE: daily smearing; 06/04/2019: intermittent bouts of 2-3 day duration followed by none    Time  8    Period  Weeks    Status  On-going    Target Date  07/30/19      PT LONG TERM GOAL #5   Title  Patient will report less than 3 incidents of stress urinary incontinence over the course of a week while changing positions against gravity in order to demonstrate improved PFM coordination, strength, and function for improved overall QOL.    Baseline  IE: multiple  times/day during sit to stand; 06/04/2019: no change    Time  8    Period  Weeks    Status  On-going    Target Date  07/30/19      PT LONG TERM GOAL #6   Title  Patient will decrease worst pain as reported on NPRS by at least 2 points to demonstrate clinically significant reduction in pain in order to restore/improve function and overall QOL.    Baseline  IE: 7/10; 06/04/2019: 5/10    Time  8    Period  Weeks    Status  On-going    Target Date  07/30/19            Plan - 06/11/19 1256    Clinical Impression Statement  Patient presents to clinic with excellent motivation to participate in therapy. Patient demonstrates deficits in posture, pain, coordination, strength, and scar mobility as evidenced by L iliac crest elevation, persistent LBP, PFM activity paradoxical with coughing, increased recruitment of neighboring musculature for PFM contraction and lengthening, decreased fascial mobility at scar sites in RUQ and LLQ. Patient able to achieve decreased LBP and spasm during today's session and responded positively to manual interventions. Patient will benefit from continued skilled therapeutic intervention to address remaining deficits in posture, pain, coordination, strength, and scar mobility in order to improve bowel and bladder elimination, increase function, and improve overall QOL.    Personal Factors and Comorbidities  Comorbidity 3+;Age;Time since onset of injury/illness/exacerbation    Comorbidities  HTN, hyperlipidemia, diverticulosis, gastritis, GERD, OA    Examination-Activity Limitations  Continence;Toileting;Stand;Squat;Locomotion Level;Carry;Caring for Others;Bend;Lift;Stairs    Examination-Participation Restrictions  Church;Shop;Cleaning;Yard Work;Community Activity    Stability/Clinical Decision Making  Evolving/Moderate complexity    Rehab Potential  Good    PT Frequency  1x / week    PT Duration  8  weeks    PT Treatment/Interventions  ADLs/Self Care Home  Management;Biofeedback;Aquatic Therapy;Cryotherapy;Electrical Stimulation;Moist Heat;Gait training;Stair training;Functional mobility training;Therapeutic activities;Therapeutic exercise;Balance training;Neuromuscular re-education;Patient/family education;Scar mobilization;Manual techniques;Dry needling;Taping    PT Next Visit Plan  progress spinal mobility, side release    PT Home Exercise Plan  pelvic tilts, breathing, bridging, HLTR, open book    Consulted and Agree with Plan of Care  Patient       Patient will benefit from skilled therapeutic intervention in order to improve the following deficits and impairments:  Abnormal gait, Increased fascial restricitons, Improper body mechanics, Pain, Decreased coordination, Decreased scar mobility, Postural dysfunction, Decreased strength, Decreased range of motion, Decreased endurance, Difficulty walking  Visit Diagnosis: 1. Abnormal posture   2. Muscle weakness (generalized)   3. Other lack of coordination        Problem List There are no active problems to display for this patient.  Myles Gip PT, DPT [phone] 06/12/2019, 2:30 PM  [facility] [address] [place], [place], [address] Phone: [phone]   Fax:  [phone]  Name: CANARY FISTER MRN: [id number] Date of Birth: August 21, 1935

## 2019-06-18 ENCOUNTER — Ambulatory Visit: Payer: Medicare Other | Admitting: Physical Therapy

## 2019-06-18 ENCOUNTER — Other Ambulatory Visit: Payer: Self-pay

## 2019-06-18 ENCOUNTER — Encounter: Payer: Self-pay | Admitting: Physical Therapy

## 2019-06-18 VITALS — BP 148/69 | HR 58

## 2019-06-18 DIAGNOSIS — R278 Other lack of coordination: Secondary | ICD-10-CM

## 2019-06-18 DIAGNOSIS — R293 Abnormal posture: Secondary | ICD-10-CM | POA: Diagnosis not present

## 2019-06-18 DIAGNOSIS — M6281 Muscle weakness (generalized): Secondary | ICD-10-CM

## 2019-06-18 NOTE — Therapy (Signed)
[place] [facility] [address] [place], [place], [address] Phone: [phone]   Fax:  [phone]  Physical Therapy Treatment  Patient Details  Name: Rachel Harris MRN: [id number] Date of Birth: 31-Mar-1935 Referring Provider (PT): Loistine Simas, MD   Encounter Date: 06/18/2019  PT End of Session - 06/18/19 1254    Visit Number  5    Number of Visits  21    Date for PT Re-Evaluation  07/30/19    Authorization Type  5/10 (IE 03/12/2019)    PT Start Time  1300    PT Stop Time  1355    PT Time Calculation (min)  55 min    Activity Tolerance  Patient tolerated treatment well    Behavior During Therapy  [facility] for tasks assessed/performed       Past Medical History:  Diagnosis Date  . Barrett esophagus   . Cataract cortical, senile   . Degenerative disc disease, cervical   . Depression   . Diverticulosis   . Environmental allergies   . Exostosis   . Fibrocystic breast disease   . Fibrocystic breast disease   . Gastritis   . GERD (gastroesophageal reflux disease)   . Hallux valgus   . HH (hiatus hernia)   . History of degenerative disc disease   . History of shingles   . Hypercholesterolemia   . Hyperlipidemia   . Hypertension   . Internal hemorrhoids   . Menopausal syndrome   . Recurrent sinus infections   . Vaginitis     Past Surgical History:  Procedure Laterality Date  . ABDOMINAL HYSTERECTOMY    . APPENDECTOMY    . BREAST BIOPSY Left 2015   neg- core  . BREAST CYST ASPIRATION Left 2015   neg  . BREAST SURGERY     biopsy  . CATARACT EXTRACTION W/ INTRAOCULAR LENS IMPLANTW/ TRABECULECTOMY    . CHOLECYSTECTOMY    . COLONOSCOPY WITH PROPOFOL N/A 08/25/2015   Procedure: COLONOSCOPY WITH PROPOFOL;  Surgeon: Lollie Sails, MD;  Location: [facility] ENDOSCOPY;  Service: Endoscopy;  Laterality: N/A;  . ESOPHAGOGASTRODUODENOSCOPY (EGD) WITH PROPOFOL N/A 08/25/2015   Procedure: ESOPHAGOGASTRODUODENOSCOPY (EGD) WITH PROPOFOL;   Surgeon: Lollie Sails, MD;  Location: [facility] ENDOSCOPY;  Service: Endoscopy;  Laterality: N/A;  . ESOPHAGOGASTRODUODENOSCOPY (EGD) WITH PROPOFOL N/A 02/20/2018   Procedure: ESOPHAGOGASTRODUODENOSCOPY (EGD) WITH PROPOFOL;  Surgeon: Lollie Sails, MD;  Location: [facility] ENDOSCOPY;  Service: Endoscopy;  Laterality: N/A;  . HERNIA REPAIR    . NISSEN FUNDOPLICATION    . TONSILLECTOMY      Vitals:   06/18/19 1309  BP: (!) 148/69  Pulse: (!) 58  SpO2: 96%    Subjective Assessment - 06/18/19 1258    Subjective  Patient has been back to MD for upper respiratory s/s and was placed back on antibiotics. Patient reports that she has been fatigued and waking up feeling nervous. Patient has some nausea but attributes this to the antibiotic.    How long can you stand comfortably?  15 min    Pain Onset  In the past 7 days       TREATMENT  Manual Therapy: R hip STM/TPR  R sacral border mobilizations, grade II, 30 sec x6  Neuromuscular Re-education: Seated L side stretch for improved posture and decreased mm imbalances 4x 30 sec Seated R side bend for improved muscle activation on R side and decreased mm imabalances x20 Seated trunk rotations 5x 20 sec each  Sidelying diaphragmatic  breathing Sidelying diaphragmatic breathing with PFM lengthening and contraction x10   Patient educated throughout session on appropriate technique and form using multi-modal cueing, HEP, and activity modification. Patient articulated understanding and returned demonstration.  Patient Response to interventions: Patient denies any increased pain and reports decreased TTP over R glute med/min.  ASSESSMENT Patient presents to clinic with excellent motivation to participate in therapy. Patient demonstrates deficits in posture, pain, coordination, strength, and scar mobility as evidenced by L iliac crest elevation, persistent LBP, PFM activity paradoxical with coughing, increased recruitment of neighboring  musculature for PFM contraction. Patient able to achieve coordinated lengthening of PFM during today's session and responded positively to active interventions. Patient will benefit from continued skilled therapeutic intervention to address remaining deficits in posture, pain, coordination, strength, and scar mobility in order to improve bowel and bladder elimination, increase function, and improve overall QOL.     PT Short Term Goals - 06/04/19 1302      PT SHORT TERM GOAL #1   Title  Patient will demonstrate independence with HEP in order to maximize therapeutic gains and improve carryover from physical therapy sessions to ADLs in the home and community.    Baseline  IE: provided; 06/04/2019: 10/10 confidence    Time  6    Period  Weeks    Status  Achieved    Target Date  04/23/19      PT SHORT TERM GOAL #2   Title  Patient will demonstrate independent and coordinated diaphragmatic breathing in supine with a 1:2 breathing pattern for improved down-regulation of the nervous system and improved management of intra-abdominal pressures in order to increase function at home and in the community.    Baseline  IE: not coordinated; 06/04/2019 has been SOB and had difficulty doing HEP    Time  4    Period  Weeks    Status  Partially Met    Target Date  07/02/19      PT SHORT TERM GOAL #3   Title  Patient will demonstrate improved toileting posture with knees higher than hips and feet supported, and will report straining <3x/week with bowel movements in order to decrease incidence of constipation/hemorrhoids/mismanagement of intra-abdominal pressure and improve overall QOL.    Baseline  IE: elevated toilet seat; 06/04/2019: not straining and has elevated foot surface    Time  4    Period  Weeks    Status  Achieved    Target Date  07/02/19        PT Long Term Goals - 06/04/19 1316      PT LONG TERM GOAL #1   Title  Patient will report BMs classified as Type 3-Type 4 on the [facility] Stool Chart  greater than 75% of the time to demonstrate improved motility and stool bulking in order to decrease fecal distress and improve overall QOL.    Baseline  IE: 100% Type 6; 06/04/2019: Type 5-6 whenever patient takes Miralax to support BM frequency    Time  8    Period  Weeks    Status  On-going    Target Date  07/30/19      PT LONG TERM GOAL #2   Title  Patient will indicate at least a 36 point difference on the PFIQ-7 short form to demonstrate clinically significant improvement for a restoration/improvement in function at home and in the community.    Baseline  IE: provided; 06/04/2019: 109.5/300 (57.1% UIQ; 52.4% CRAIQ)    Time  8  Period  Weeks    Status  New    Target Date  07/30/19      PT LONG TERM GOAL #3   Title  Patient will indicate at least a 45 point difference on the PFDI-20 form to demonstrate clinically significant improvement of PFM for a return to PLOF at home and in the community.    Baseline  IE: provided; 06/04/2019: 135.3/300 (UDI 70.8; CRAD 31.25; POPDI 33.25)    Time  8    Period  Weeks    Status  New    Target Date  07/30/19      PT LONG TERM GOAL #4   Title  Patient will report >2 weeks without episode of fecal smearing for improved function and participation at home and in the community.    Baseline  IE: daily smearing; 06/04/2019: intermittent bouts of 2-3 day duration followed by none    Time  8    Period  Weeks    Status  On-going    Target Date  07/30/19      PT LONG TERM GOAL #5   Title  Patient will report less than 3 incidents of stress urinary incontinence over the course of a week while changing positions against gravity in order to demonstrate improved PFM coordination, strength, and function for improved overall QOL.    Baseline  IE: multiple times/day during sit to stand; 06/04/2019: no change    Time  8    Period  Weeks    Status  On-going    Target Date  07/30/19      PT LONG TERM GOAL #6   Title  Patient will decrease worst pain as reported on  NPRS by at least 2 points to demonstrate clinically significant reduction in pain in order to restore/improve function and overall QOL.    Baseline  IE: 7/10; 06/04/2019: 5/10    Time  8    Period  Weeks    Status  On-going    Target Date  07/30/19            Plan - 06/18/19 1255    Clinical Impression Statement  Patient presents to clinic with excellent motivation to participate in therapy. Patient demonstrates deficits in posture, pain, coordination, strength, and scar mobility as evidenced by L iliac crest elevation, persistent LBP, PFM activity paradoxical with coughing, increased recruitment of neighboring musculature for PFM contraction. Patient able to achieve coordinated lengthening of PFM during today's session and responded positively to active interventions. Patient will benefit from continued skilled therapeutic intervention to address remaining deficits in posture, pain, coordination, strength, and scar mobility in order to improve bowel and bladder elimination, increase function, and improve overall QOL.    Personal Factors and Comorbidities  Comorbidity 3+;Age;Time since onset of injury/illness/exacerbation    Comorbidities  HTN, hyperlipidemia, diverticulosis, gastritis, GERD, OA    Examination-Activity Limitations  Continence;Toileting;Stand;Squat;Locomotion Level;Carry;Caring for Others;Bend;Lift;Stairs    Examination-Participation Restrictions  Church;Shop;Cleaning;Yard Work;Community Activity    Stability/Clinical Decision Making  Evolving/Moderate complexity    Rehab Potential  Good    PT Frequency  1x / week    PT Duration  8 weeks    PT Treatment/Interventions  ADLs/Self Care Home Management;Biofeedback;Aquatic Therapy;Cryotherapy;Electrical Stimulation;Moist Heat;Gait training;Stair training;Functional mobility training;Therapeutic activities;Therapeutic exercise;Balance training;Neuromuscular re-education;Patient/family education;Scar mobilization;Manual techniques;Dry  needling;Taping    PT Next Visit Plan  progress spinal mobility, side release    PT Home Exercise Plan  pelvic tilts, breathing, bridging, HLTR, open book    Consulted and  Agree with Plan of Care  Patient       Patient will benefit from skilled therapeutic intervention in order to improve the following deficits and impairments:  Abnormal gait, Increased fascial restricitons, Improper body mechanics, Pain, Decreased coordination, Decreased scar mobility, Postural dysfunction, Decreased strength, Decreased range of motion, Decreased endurance, Difficulty walking  Visit Diagnosis: 1. Abnormal posture   2. Muscle weakness (generalized)   3. Other lack of coordination        Problem List There are no active problems to display for this patient.  Myles Gip PT, DPT [phone] 06/18/2019, 4:02 PM  [place] [address] [place], [place], [address] Phone: [phone]   Fax:  [phone]  Name: BRAYLEE LAL MRN: [id number] Date of Birth: [birth year]

## 2019-06-25 ENCOUNTER — Ambulatory Visit: Payer: Medicare Other | Admitting: Physical Therapy

## 2019-06-25 ENCOUNTER — Encounter: Payer: Self-pay | Admitting: Physical Therapy

## 2019-06-25 ENCOUNTER — Other Ambulatory Visit: Payer: Self-pay

## 2019-06-25 DIAGNOSIS — M6281 Muscle weakness (generalized): Secondary | ICD-10-CM

## 2019-06-25 DIAGNOSIS — R293 Abnormal posture: Secondary | ICD-10-CM

## 2019-06-25 DIAGNOSIS — R278 Other lack of coordination: Secondary | ICD-10-CM

## 2019-06-25 NOTE — Therapy (Signed)
[facility] [address] [place], [place], [address] Phone: [phone]   Fax:  [phone]  Physical Therapy Treatment  Patient Details  Name: Rachel Harris MRN: [id number] Date of Birth: [birth year] Referring Provider (PT): Loistine Simas, MD   Encounter Date: 06/25/2019  PT End of Session - 06/25/19 1256    Visit Number  6    Number of Visits  21    Date for PT Re-Evaluation  07/30/19    Authorization Type  6/10 (IE 03/12/2019)    PT Start Time  1258    PT Stop Time  1353    PT Time Calculation (min)  55 min    Activity Tolerance  Patient tolerated treatment well    Behavior During Therapy  [facility] for tasks assessed/performed       Past Medical History:  Diagnosis Date  . Barrett esophagus   . Cataract cortical, senile   . Degenerative disc disease, cervical   . Depression   . Diverticulosis   . Environmental allergies   . Exostosis   . Fibrocystic breast disease   . Fibrocystic breast disease   . Gastritis   . GERD (gastroesophageal reflux disease)   . Hallux valgus   . HH (hiatus hernia)   . History of degenerative disc disease   . History of shingles   . Hypercholesterolemia   . Hyperlipidemia   . Hypertension   . Internal hemorrhoids   . Menopausal syndrome   . Recurrent sinus infections   . Vaginitis     Past Surgical History:  Procedure Laterality Date  . ABDOMINAL HYSTERECTOMY    . APPENDECTOMY    . BREAST BIOPSY Left 2015   neg- core  . BREAST CYST ASPIRATION Left 2015   neg  . BREAST SURGERY     biopsy  . CATARACT EXTRACTION W/ INTRAOCULAR LENS IMPLANTW/ TRABECULECTOMY    . CHOLECYSTECTOMY    . COLONOSCOPY WITH PROPOFOL N/A 08/25/2015   Procedure: COLONOSCOPY WITH PROPOFOL;  Surgeon: Lollie Sails, MD;  Location: [facility] ENDOSCOPY;  Service: Endoscopy;  Laterality: N/A;  . ESOPHAGOGASTRODUODENOSCOPY (EGD) WITH PROPOFOL N/A 08/25/2015   Procedure: ESOPHAGOGASTRODUODENOSCOPY (EGD) WITH PROPOFOL;   Surgeon: Lollie Sails, MD;  Location: [facility] ENDOSCOPY;  Service: Endoscopy;  Laterality: N/A;  . ESOPHAGOGASTRODUODENOSCOPY (EGD) WITH PROPOFOL N/A 02/20/2018   Procedure: ESOPHAGOGASTRODUODENOSCOPY (EGD) WITH PROPOFOL;  Surgeon: Lollie Sails, MD;  Location: [facility] ENDOSCOPY;  Service: Endoscopy;  Laterality: N/A;  . HERNIA REPAIR    . NISSEN FUNDOPLICATION    . TONSILLECTOMY      There were no vitals filed for this visit.  Subjective Assessment - 06/25/19 1258    Subjective  Patient states that she continues to have back pain after prolonged movement/activity. Patient has some soreness on her R SIJ region. Patient has had improved BMs but still goes 3 days before having a BM occasionally. Patient has had improved symptoms from upper respiratpry infection. Patient has not been taking miralax and has not had any fecal smearing since stopping.    How long can you stand comfortably?  15 min    Pain Onset  In the past 7 days       TREATMENT  Manual Therapy: R sacrospinous and sacrotuberous STM R sacral border mobilizations, grade III, 30 sec x6  Neuromuscular Re-education: Supine diaphragmatic breathing x20 Supine PFM coordination with breath, VCs and TCS. Hips elevated for gravity supported movement Seated diaphragmatic breathing x15 Seated PFM contractions with breath (  the knack) for positional changes against gravity   Patient educated throughout session on appropriate technique and form using multi-modal cueing, HEP, and activity modification. Patient articulated understanding and returned demonstration.  Patient Response to interventions: Patient denies any increased pain and reports high confidence in ability to practice PFM contractions at home.   ASSESSMENT Patient presents to clinic with excellent motivation to participate in therapy. Patient demonstrates deficits in posture, pain, coordination, strength, and scar mobility as evidenced by difficulty coordinating PFM  contraction in gravity assisted position, increased recruitment of neighboring musculature for PFM contraction. Patient able to achieve coordinated contraction for short duration of PFM during today's session and responded positively to manual interventions. Patient will benefit from continued skilled therapeutic intervention to address remaining deficits in posture, pain, coordination, strength, and scar mobility in order to improve bowel and bladder elimination, increase function, and improve overall QOL.    PT Short Term Goals - 06/04/19 1302      PT SHORT TERM GOAL #1   Title  Patient will demonstrate independence with HEP in order to maximize therapeutic gains and improve carryover from physical therapy sessions to ADLs in the home and community.    Baseline  IE: provided; 06/04/2019: 10/10 confidence    Time  6    Period  Weeks    Status  Achieved    Target Date  04/23/19      PT SHORT TERM GOAL #2   Title  Patient will demonstrate independent and coordinated diaphragmatic breathing in supine with a 1:2 breathing pattern for improved down-regulation of the nervous system and improved management of intra-abdominal pressures in order to increase function at home and in the community.    Baseline  IE: not coordinated; 06/04/2019 has been SOB and had difficulty doing HEP    Time  4    Period  Weeks    Status  Partially Met    Target Date  07/02/19      PT SHORT TERM GOAL #3   Title  Patient will demonstrate improved toileting posture with knees higher than hips and feet supported, and will report straining <3x/week with bowel movements in order to decrease incidence of constipation/hemorrhoids/mismanagement of intra-abdominal pressure and improve overall QOL.    Baseline  IE: elevated toilet seat; 06/04/2019: not straining and has elevated foot surface    Time  4    Period  Weeks    Status  Achieved    Target Date  07/02/19        PT Long Term Goals - 06/04/19 1316      PT LONG TERM  GOAL #1   Title  Patient will report BMs classified as Type 3-Type 4 on the [facility] Stool Chart greater than 75% of the time to demonstrate improved motility and stool bulking in order to decrease fecal distress and improve overall QOL.    Baseline  IE: 100% Type 6; 06/04/2019: Type 5-6 whenever patient takes Miralax to support BM frequency    Time  8    Period  Weeks    Status  On-going    Target Date  07/30/19      PT LONG TERM GOAL #2   Title  Patient will indicate at least a 36 point difference on the PFIQ-7 short form to demonstrate clinically significant improvement for a restoration/improvement in function at home and in the community.    Baseline  IE: provided; 06/04/2019: 109.5/300 (57.1% UIQ; 52.4% CRAIQ)    Time  8  Period  Weeks    Status  New    Target Date  07/30/19      PT LONG TERM GOAL #3   Title  Patient will indicate at least a 45 point difference on the PFDI-20 form to demonstrate clinically significant improvement of PFM for a return to PLOF at home and in the community.    Baseline  IE: provided; 06/04/2019: 135.3/300 (UDI 70.8; CRAD 31.25; POPDI 33.25)    Time  8    Period  Weeks    Status  New    Target Date  07/30/19      PT LONG TERM GOAL #4   Title  Patient will report >2 weeks without episode of fecal smearing for improved function and participation at home and in the community.    Baseline  IE: daily smearing; 06/04/2019: intermittent bouts of 2-3 day duration followed by none    Time  8    Period  Weeks    Status  On-going    Target Date  07/30/19      PT LONG TERM GOAL #5   Title  Patient will report less than 3 incidents of stress urinary incontinence over the course of a week while changing positions against gravity in order to demonstrate improved PFM coordination, strength, and function for improved overall QOL.    Baseline  IE: multiple times/day during sit to stand; 06/04/2019: no change    Time  8    Period  Weeks    Status  On-going    Target Date   07/30/19      PT LONG TERM GOAL #6   Title  Patient will decrease worst pain as reported on NPRS by at least 2 points to demonstrate clinically significant reduction in pain in order to restore/improve function and overall QOL.    Baseline  IE: 7/10; 06/04/2019: 5/10    Time  8    Period  Weeks    Status  On-going    Target Date  07/30/19            Plan - 06/25/19 1303    Clinical Impression Statement  Patient presents to clinic with excellent motivation to participate in therapy. Patient demonstrates deficits in posture, pain, coordination, strength, and scar mobility as evidenced by difficulty coordinating PFM contraction in gravity assisted position, increased recruitment of neighboring musculature for PFM contraction. Patient able to achieve coordinated contraction for short duration of PFM during today's session and responded positively to manual interventions. Patient will benefit from continued skilled therapeutic intervention to address remaining deficits in posture, pain, coordination, strength, and scar mobility in order to improve bowel and bladder elimination, increase function, and improve overall QOL.    Personal Factors and Comorbidities  Comorbidity 3+;Age;Time since onset of injury/illness/exacerbation    Comorbidities  HTN, hyperlipidemia, diverticulosis, gastritis, GERD, OA    Examination-Activity Limitations  Continence;Toileting;Stand;Squat;Locomotion Level;Carry;Caring for Others;Bend;Lift;Stairs    Examination-Participation Restrictions  Church;Shop;Cleaning;Yard Work;Community Activity    Stability/Clinical Decision Making  Evolving/Moderate complexity    Rehab Potential  Good    PT Frequency  1x / week    PT Duration  8 weeks    PT Treatment/Interventions  ADLs/Self Care Home Management;Biofeedback;Aquatic Therapy;Cryotherapy;Electrical Stimulation;Moist Heat;Gait training;Stair training;Functional mobility training;Therapeutic activities;Therapeutic  exercise;Balance training;Neuromuscular re-education;Patient/family education;Scar mobilization;Manual techniques;Dry needling;Taping    PT Next Visit Plan  progress spinal mobility, side release    PT Home Exercise Plan  Hips elevated PFM contractions 3x daily x10    Consulted and  Agree with Plan of Care  Patient       Patient will benefit from skilled therapeutic intervention in order to improve the following deficits and impairments:  Abnormal gait, Increased fascial restricitons, Improper body mechanics, Pain, Decreased coordination, Decreased scar mobility, Postural dysfunction, Decreased strength, Decreased range of motion, Decreased endurance, Difficulty walking  Visit Diagnosis: 1. Abnormal posture   2. Muscle weakness (generalized)   3. Other lack of coordination        Problem List There are no active problems to display for this patient.  Myles Gip PT, DPT [phone] 06/25/2019, 2:09 PM  [facility] [address] [place], [place], [address] Phone: [phone]   Fax:  [phone]  Name: Rachel Harris MRN: [id number] Date of Birth: 02-24-1935

## 2019-07-02 ENCOUNTER — Encounter: Payer: Self-pay | Admitting: Physical Therapy

## 2019-07-02 ENCOUNTER — Other Ambulatory Visit: Payer: Self-pay

## 2019-07-02 ENCOUNTER — Ambulatory Visit: Payer: Medicare Other | Attending: Gastroenterology | Admitting: Physical Therapy

## 2019-07-02 DIAGNOSIS — M6281 Muscle weakness (generalized): Secondary | ICD-10-CM | POA: Diagnosis present

## 2019-07-02 DIAGNOSIS — R278 Other lack of coordination: Secondary | ICD-10-CM

## 2019-07-02 DIAGNOSIS — R293 Abnormal posture: Secondary | ICD-10-CM | POA: Diagnosis not present

## 2019-07-02 NOTE — Therapy (Signed)
[facility] [address] [place], [place], [address] Phone: [phone]   Fax:  [phone]  Physical Therapy Treatment  Patient Details  Name: Rachel Harris MRN: [id number] Date of Birth: [birth year] Referring Provider (PT): Loistine Simas, MD   Encounter Date: 07/02/2019  PT End of Session - 07/02/19 1300    Visit Number  7    Number of Visits  21    Date for PT Re-Evaluation  07/30/19    Authorization Type  7/10 (IE 03/12/2019)    PT Start Time  1255    PT Stop Time  1350    PT Time Calculation (min)  55 min    Activity Tolerance  Patient tolerated treatment well    Behavior During Therapy  [facility] for tasks assessed/performed       Past Medical History:  Diagnosis Date  . Barrett esophagus   . Cataract cortical, senile   . Degenerative disc disease, cervical   . Depression   . Diverticulosis   . Environmental allergies   . Exostosis   . Fibrocystic breast disease   . Fibrocystic breast disease   . Gastritis   . GERD (gastroesophageal reflux disease)   . Hallux valgus   . HH (hiatus hernia)   . History of degenerative disc disease   . History of shingles   . Hypercholesterolemia   . Hyperlipidemia   . Hypertension   . Internal hemorrhoids   . Menopausal syndrome   . Recurrent sinus infections   . Vaginitis     Past Surgical History:  Procedure Laterality Date  . ABDOMINAL HYSTERECTOMY    . APPENDECTOMY    . BREAST BIOPSY Left 2015   neg- core  . BREAST CYST ASPIRATION Left 2015   neg  . BREAST SURGERY     biopsy  . CATARACT EXTRACTION W/ INTRAOCULAR LENS IMPLANTW/ TRABECULECTOMY    . CHOLECYSTECTOMY    . COLONOSCOPY WITH PROPOFOL N/A 08/25/2015   Procedure: COLONOSCOPY WITH PROPOFOL;  Surgeon: Lollie Sails, MD;  Location: [facility] ENDOSCOPY;  Service: Endoscopy;  Laterality: N/A;  . ESOPHAGOGASTRODUODENOSCOPY (EGD) WITH PROPOFOL N/A 08/25/2015   Procedure: ESOPHAGOGASTRODUODENOSCOPY (EGD) WITH PROPOFOL;   Surgeon: Lollie Sails, MD;  Location: [facility] ENDOSCOPY;  Service: Endoscopy;  Laterality: N/A;  . ESOPHAGOGASTRODUODENOSCOPY (EGD) WITH PROPOFOL N/A 02/20/2018   Procedure: ESOPHAGOGASTRODUODENOSCOPY (EGD) WITH PROPOFOL;  Surgeon: Lollie Sails, MD;  Location: [facility] ENDOSCOPY;  Service: Endoscopy;  Laterality: N/A;  . HERNIA REPAIR    . NISSEN FUNDOPLICATION    . TONSILLECTOMY      There were no vitals filed for this visit.  Subjective Assessment - 07/02/19 1259    Subjective  Patient reports that she is doing so so today. Patient denies any significant changes since her last visit. Patient reports that she does her HEP at least once/day.    How long can you stand comfortably?  15 min    Pain Onset  In the past 7 days       TREATMENT  Manual Therapy: B mobilizations with movement, open book, grade II/III T4-L3 multiple bouts STM/TPR lumbar paraspinals B  Neuromuscular Re-education: Supine diaphragmatic breathing x20 Supine PFM coordination with breath, VCs and TCS. Hips elevated for gravity supported movement Supine PFM coordination with breath and hip adduction squeeze for improved recruitment of PFM  Supine HLTR for decreased LBP   Patient educated throughout session on appropriate technique and form using multi-modal cueing, HEP, and activity modification. Patient articulated  understanding and returned demonstration.  Patient Response to interventions: Patient denies increase in pain and reporting motivation to continue with PFM strengthening HEP  ASSESSMENT Patient presents to clinic with excellent motivation to participate in therapy. Patient demonstrates deficits in posture, pain, coordination, strength, and scar mobility as evidenced by difficulty coordinating PFM contraction in gravity assisted position, increased recruitment of neighboring musculature for PFM contraction. Patient able to achieve coordinated contraction of PFM for 10 consecutive repetitions during  today's session and responded positively to manual interventions. Patient will benefit from continued skilled therapeutic intervention to address remaining deficits in posture, pain, coordination, strength, and scar mobility in order to improve bowel and bladder elimination, increase function, and improve overall QOL.     PT Short Term Goals - 06/04/19 1302      PT SHORT TERM GOAL #1   Title  Patient will demonstrate independence with HEP in order to maximize therapeutic gains and improve carryover from physical therapy sessions to ADLs in the home and community.    Baseline  IE: provided; 06/04/2019: 10/10 confidence    Time  6    Period  Weeks    Status  Achieved    Target Date  04/23/19      PT SHORT TERM GOAL #2   Title  Patient will demonstrate independent and coordinated diaphragmatic breathing in supine with a 1:2 breathing pattern for improved down-regulation of the nervous system and improved management of intra-abdominal pressures in order to increase function at home and in the community.    Baseline  IE: not coordinated; 06/04/2019 has been SOB and had difficulty doing HEP    Time  4    Period  Weeks    Status  Partially Met    Target Date  07/02/19      PT SHORT TERM GOAL #3   Title  Patient will demonstrate improved toileting posture with knees higher than hips and feet supported, and will report straining <3x/week with bowel movements in order to decrease incidence of constipation/hemorrhoids/mismanagement of intra-abdominal pressure and improve overall QOL.    Baseline  IE: elevated toilet seat; 06/04/2019: not straining and has elevated foot surface    Time  4    Period  Weeks    Status  Achieved    Target Date  07/02/19        PT Long Term Goals - 06/04/19 1316      PT LONG TERM GOAL #1   Title  Patient will report BMs classified as Type 3-Type 4 on the [facility] Stool Chart greater than 75% of the time to demonstrate improved motility and stool bulking in order to  decrease fecal distress and improve overall QOL.    Baseline  IE: 100% Type 6; 06/04/2019: Type 5-6 whenever patient takes Miralax to support BM frequency    Time  8    Period  Weeks    Status  On-going    Target Date  07/30/19      PT LONG TERM GOAL #2   Title  Patient will indicate at least a 36 point difference on the PFIQ-7 short form to demonstrate clinically significant improvement for a restoration/improvement in function at home and in the community.    Baseline  IE: provided; 06/04/2019: 109.5/300 (57.1% UIQ; 52.4% CRAIQ)    Time  8    Period  Weeks    Status  New    Target Date  07/30/19      PT LONG TERM GOAL #3  Title  Patient will indicate at least a 45 point difference on the PFDI-20 form to demonstrate clinically significant improvement of PFM for a return to PLOF at home and in the community.    Baseline  IE: provided; 06/04/2019: 135.3/300 (UDI 70.8; CRAD 31.25; POPDI 33.25)    Time  8    Period  Weeks    Status  New    Target Date  07/30/19      PT LONG TERM GOAL #4   Title  Patient will report >2 weeks without episode of fecal smearing for improved function and participation at home and in the community.    Baseline  IE: daily smearing; 06/04/2019: intermittent bouts of 2-3 day duration followed by none    Time  8    Period  Weeks    Status  On-going    Target Date  07/30/19      PT LONG TERM GOAL #5   Title  Patient will report less than 3 incidents of stress urinary incontinence over the course of a week while changing positions against gravity in order to demonstrate improved PFM coordination, strength, and function for improved overall QOL.    Baseline  IE: multiple times/day during sit to stand; 06/04/2019: no change    Time  8    Period  Weeks    Status  On-going    Target Date  07/30/19      PT LONG TERM GOAL #6   Title  Patient will decrease worst pain as reported on NPRS by at least 2 points to demonstrate clinically significant reduction in pain in order to  restore/improve function and overall QOL.    Baseline  IE: 7/10; 06/04/2019: 5/10    Time  8    Period  Weeks    Status  On-going    Target Date  07/30/19            Plan - 07/02/19 1301    Clinical Impression Statement  Patient presents to clinic with excellent motivation to participate in therapy. Patient demonstrates deficits in posture, pain, coordination, strength, and scar mobility as evidenced by difficulty coordinating PFM contraction in gravity assisted position, increased recruitment of neighboring musculature for PFM contraction. Patient able to achieve coordinated contraction of PFM for 10 consecutive repetitions during today's session and responded positively to manual interventions. Patient will benefit from continued skilled therapeutic intervention to address remaining deficits in posture, pain, coordination, strength, and scar mobility in order to improve bowel and bladder elimination, increase function, and improve overall QOL.    Personal Factors and Comorbidities  Comorbidity 3+;Age;Time since onset of injury/illness/exacerbation    Comorbidities  HTN, hyperlipidemia, diverticulosis, gastritis, GERD, OA    Examination-Activity Limitations  Continence;Toileting;Stand;Squat;Locomotion Level;Carry;Caring for Others;Bend;Lift;Stairs    Examination-Participation Restrictions  Church;Shop;Cleaning;Yard Work;Community Activity    Stability/Clinical Decision Making  Evolving/Moderate complexity    Rehab Potential  Good    PT Frequency  1x / week    PT Duration  8 weeks    PT Treatment/Interventions  ADLs/Self Care Home Management;Biofeedback;Aquatic Therapy;Cryotherapy;Electrical Stimulation;Moist Heat;Gait training;Stair training;Functional mobility training;Therapeutic activities;Therapeutic exercise;Balance training;Neuromuscular re-education;Patient/family education;Scar mobilization;Manual techniques;Dry needling;Taping    PT Next Visit Plan  progress spinal mobility, side  release    PT Home Exercise Plan  Hips elevated PFM contractions 3x daily x10    Consulted and Agree with Plan of Care  Patient       Patient will benefit from skilled therapeutic intervention in order to improve the following deficits  and impairments:  Abnormal gait, Increased fascial restricitons, Improper body mechanics, Pain, Decreased coordination, Decreased scar mobility, Postural dysfunction, Decreased strength, Decreased range of motion, Decreased endurance, Difficulty walking  Visit Diagnosis: 1. Abnormal posture   2. Muscle weakness (generalized)   3. Other lack of coordination        Problem List There are no active problems to display for this patient.  Myles Gip PT, DPT [phone] 07/02/2019, 2:39 PM  [place] [facility] [address] [place], [place], [address] Phone: [phone]   Fax:  [phone]  Name: Rachel Harris MRN: [id number] Date of Birth: Mar 08, 1935

## 2019-07-09 ENCOUNTER — Other Ambulatory Visit: Payer: Self-pay

## 2019-07-09 ENCOUNTER — Ambulatory Visit: Payer: Medicare Other | Admitting: Physical Therapy

## 2019-07-09 ENCOUNTER — Encounter: Payer: Self-pay | Admitting: Physical Therapy

## 2019-07-09 DIAGNOSIS — M6281 Muscle weakness (generalized): Secondary | ICD-10-CM

## 2019-07-09 DIAGNOSIS — R293 Abnormal posture: Secondary | ICD-10-CM | POA: Diagnosis not present

## 2019-07-09 DIAGNOSIS — R278 Other lack of coordination: Secondary | ICD-10-CM

## 2019-07-09 NOTE — Therapy (Signed)
[facility] [address] [place], [place], [address] Phone: [phone]   Fax:  [phone]  Physical Therapy Treatment  Patient Details  Name: Rachel Harris MRN: [id number] Date of Birth: 12-31-1934 Referring Provider (PT): Loistine Simas, MD   Encounter Date: 07/09/2019  PT End of Session - 07/09/19 1256    Visit Number  8    Number of Visits  21    Date for PT Re-Evaluation  07/30/19    Authorization Type  7/10 (IE 03/12/2019)    PT Start Time  1252    PT Stop Time  1400    PT Time Calculation (min)  68 min    Activity Tolerance  Patient tolerated treatment well;Patient limited by pain    Behavior During Therapy  [facility] for tasks assessed/performed       Past Medical History:  Diagnosis Date  . Barrett esophagus   . Cataract cortical, senile   . Degenerative disc disease, cervical   . Depression   . Diverticulosis   . Environmental allergies   . Exostosis   . Fibrocystic breast disease   . Fibrocystic breast disease   . Gastritis   . GERD (gastroesophageal reflux disease)   . Hallux valgus   . HH (hiatus hernia)   . History of degenerative disc disease   . History of shingles   . Hypercholesterolemia   . Hyperlipidemia   . Hypertension   . Internal hemorrhoids   . Menopausal syndrome   . Recurrent sinus infections   . Vaginitis     Past Surgical History:  Procedure Laterality Date  . ABDOMINAL HYSTERECTOMY    . APPENDECTOMY    . BREAST BIOPSY Left 2015   neg- core  . BREAST CYST ASPIRATION Left 2015   neg  . BREAST SURGERY     biopsy  . CATARACT EXTRACTION W/ INTRAOCULAR LENS IMPLANTW/ TRABECULECTOMY    . CHOLECYSTECTOMY    . COLONOSCOPY WITH PROPOFOL N/A 08/25/2015   Procedure: COLONOSCOPY WITH PROPOFOL;  Surgeon: Lollie Sails, MD;  Location: [facility] ENDOSCOPY;  Service: Endoscopy;  Laterality: N/A;  . ESOPHAGOGASTRODUODENOSCOPY (EGD) WITH PROPOFOL N/A 08/25/2015   Procedure:  ESOPHAGOGASTRODUODENOSCOPY (EGD) WITH PROPOFOL;  Surgeon: Lollie Sails, MD;  Location: [facility] ENDOSCOPY;  Service: Endoscopy;  Laterality: N/A;  . ESOPHAGOGASTRODUODENOSCOPY (EGD) WITH PROPOFOL N/A 02/20/2018   Procedure: ESOPHAGOGASTRODUODENOSCOPY (EGD) WITH PROPOFOL;  Surgeon: Lollie Sails, MD;  Location: [facility] ENDOSCOPY;  Service: Endoscopy;  Laterality: N/A;  . HERNIA REPAIR    . NISSEN FUNDOPLICATION    . TONSILLECTOMY      There were no vitals filed for this visit.  Subjective Assessment - 07/09/19 1254    Subjective  Patient states that she has some increased back pain 2/2 to yard work over the weekend.    How long can you stand comfortably?  15 min    Currently in Pain?  Yes    Pain Score  6     Pain Location  Back    Pain Orientation  Right;Lower    Pain Descriptors / Indicators  Aching    Pain Onset  In the past 7 days      TREATMENT  Manual Therapy: R lateral hip distraction, 3 min x4 bouts at varying degrees of flexion and IR STM, gentle superficial for improved lymph stimulation in LLE B hip PNF stretches  Neuromuscular Re-education: PFM HEP review and progression with handout and education. Diaphragmatic breathing in supported hooklying; 3 min  2:4 ratio; 3 min 3:6 ratio with tactile cues for decreased accessory mm recruitment Gentle supported hooklying pelvic tilts, x25  Supine B hip controlled articular rotations for improved afferent input from joint capsule to decrease stiffness and improve  Proprioception, x4 each direction   Treatments unbilled: MHP in supine during active interventions for pain modulation   Patient educated throughout session on appropriate technique and form using multi-modal cueing, HEP, and activity modification. Patient articulated understanding and returned demonstration.  Patient Response to interventions: Patient reports 3/10 pain in back at end of session; decreased stiffness at R hip  ASSESSMENT Patient presents to  clinic with excellent motivation to participate in therapy. Patient demonstrates deficits in spine and hip ROM, PFM strength and coordination, and postural endurance. Patient able to achieve coordinated and controlled pelvic tilts without gluteal compensations during today's session and responded positively to manual interventions. Patient will benefit from continued skilled therapeutic intervention to address remaining deficits in spine and hip ROM, PFM strength and coordination, and postural endurance in order to decrease pain and incidence of incontinence, increase function, and improve overall QOL.       PT Short Term Goals - 06/04/19 1302      PT SHORT TERM GOAL #1   Title  Patient will demonstrate independence with HEP in order to maximize therapeutic gains and improve carryover from physical therapy sessions to ADLs in the home and community.    Baseline  IE: provided; 06/04/2019: 10/10 confidence    Time  6    Period  Weeks    Status  Achieved    Target Date  04/23/19      PT SHORT TERM GOAL #2   Title  Patient will demonstrate independent and coordinated diaphragmatic breathing in supine with a 1:2 breathing pattern for improved down-regulation of the nervous system and improved management of intra-abdominal pressures in order to increase function at home and in the community.    Baseline  IE: not coordinated; 06/04/2019 has been SOB and had difficulty doing HEP    Time  4    Period  Weeks    Status  Partially Met    Target Date  07/02/19      PT SHORT TERM GOAL #3   Title  Patient will demonstrate improved toileting posture with knees higher than hips and feet supported, and will report straining <3x/week with bowel movements in order to decrease incidence of constipation/hemorrhoids/mismanagement of intra-abdominal pressure and improve overall QOL.    Baseline  IE: elevated toilet seat; 06/04/2019: not straining and has elevated foot surface    Time  4    Period  Weeks    Status   Achieved    Target Date  07/02/19        PT Long Term Goals - 06/04/19 1316      PT LONG TERM GOAL #1   Title  Patient will report BMs classified as Type 3-Type 4 on the [facility] Stool Chart greater than 75% of the time to demonstrate improved motility and stool bulking in order to decrease fecal distress and improve overall QOL.    Baseline  IE: 100% Type 6; 06/04/2019: Type 5-6 whenever patient takes Miralax to support BM frequency    Time  8    Period  Weeks    Status  On-going    Target Date  07/30/19      PT LONG TERM GOAL #2   Title  Patient will indicate at least a 36 point difference  on the PFIQ-7 short form to demonstrate clinically significant improvement for a restoration/improvement in function at home and in the community.    Baseline  IE: provided; 06/04/2019: 109.5/300 (57.1% UIQ; 52.4% CRAIQ)    Time  8    Period  Weeks    Status  New    Target Date  07/30/19      PT LONG TERM GOAL #3   Title  Patient will indicate at least a 45 point difference on the PFDI-20 form to demonstrate clinically significant improvement of PFM for a return to PLOF at home and in the community.    Baseline  IE: provided; 06/04/2019: 135.3/300 (UDI 70.8; CRAD 31.25; POPDI 33.25)    Time  8    Period  Weeks    Status  New    Target Date  07/30/19      PT LONG TERM GOAL #4   Title  Patient will report >2 weeks without episode of fecal smearing for improved function and participation at home and in the community.    Baseline  IE: daily smearing; 06/04/2019: intermittent bouts of 2-3 day duration followed by none    Time  8    Period  Weeks    Status  On-going    Target Date  07/30/19      PT LONG TERM GOAL #5   Title  Patient will report less than 3 incidents of stress urinary incontinence over the course of a week while changing positions against gravity in order to demonstrate improved PFM coordination, strength, and function for improved overall QOL.    Baseline  IE: multiple times/day during  sit to stand; 06/04/2019: no change    Time  8    Period  Weeks    Status  On-going    Target Date  07/30/19      PT LONG TERM GOAL #6   Title  Patient will decrease worst pain as reported on NPRS by at least 2 points to demonstrate clinically significant reduction in pain in order to restore/improve function and overall QOL.    Baseline  IE: 7/10; 06/04/2019: 5/10    Time  8    Period  Weeks    Status  On-going    Target Date  07/30/19            Plan - 07/09/19 1256    Clinical Impression Statement  Patient presents to clinic with excellent motivation to participate in therapy. Patient demonstrates deficits in spine and hip ROM, PFM strength and coordination, and postural endurance. Patient able to achieve coordinated and controlled pelvic tilts without gluteal compensations during today's session and responded positively to manual interventions. Patient will benefit from continued skilled therapeutic intervention to address remaining deficits in spine and hip ROM, PFM strength and coordination, and postural endurance in order to decrease pain and incidence of incontinence, increase function, and improve overall QOL.    Personal Factors and Comorbidities  Comorbidity 3+;Age;Time since onset of injury/illness/exacerbation    Comorbidities  HTN, hyperlipidemia, diverticulosis, gastritis, GERD, OA    Examination-Activity Limitations  Continence;Toileting;Stand;Squat;Locomotion Level;Carry;Caring for Others;Bend;Lift;Stairs    Examination-Participation Restrictions  Church;Shop;Cleaning;Yard Work;Community Activity    Stability/Clinical Decision Making  Evolving/Moderate complexity    Rehab Potential  Good    PT Frequency  1x / week    PT Duration  8 weeks    PT Treatment/Interventions  ADLs/Self Care Home Management;Biofeedback;Aquatic Therapy;Cryotherapy;Electrical Stimulation;Moist Heat;Gait training;Stair training;Functional mobility training;Therapeutic activities;Therapeutic  exercise;Balance training;Neuromuscular re-education;Patient/family education;Scar mobilization;Manual techniques;Dry  needling;Taping    PT Next Visit Plan  progress spinal mobility, side release    PT Home Exercise Plan  Hips elevated PFM contractions 3x daily x10    Consulted and Agree with Plan of Care  Patient       Patient will benefit from skilled therapeutic intervention in order to improve the following deficits and impairments:  Abnormal gait, Increased fascial restricitons, Improper body mechanics, Pain, Decreased coordination, Decreased scar mobility, Postural dysfunction, Decreased strength, Decreased range of motion, Decreased endurance, Difficulty walking  Visit Diagnosis: 1. Abnormal posture   2. Muscle weakness (generalized)   3. Other lack of coordination        Problem List There are no active problems to display for this patient.  Myles Gip PT, DPT [phone] 07/09/2019, 2:13 PM   [facility] [address] [place], [place], [address] Phone: [phone]   Fax:  [phone]  Name: Rachel Harris MRN: [id number] Date of Birth: 05/03/1935

## 2019-07-16 ENCOUNTER — Ambulatory Visit: Payer: Medicare Other | Admitting: Physical Therapy

## 2019-07-23 ENCOUNTER — Ambulatory Visit: Payer: Medicare Other | Admitting: Physical Therapy

## 2019-07-26 ENCOUNTER — Other Ambulatory Visit: Payer: Self-pay | Admitting: Internal Medicine

## 2019-07-26 DIAGNOSIS — Z1231 Encounter for screening mammogram for malignant neoplasm of breast: Secondary | ICD-10-CM

## 2019-08-07 ENCOUNTER — Other Ambulatory Visit: Payer: Self-pay

## 2019-08-07 ENCOUNTER — Ambulatory Visit
Admission: RE | Admit: 2019-08-07 | Discharge: 2019-08-07 | Disposition: A | Discharge status: Home / Self Care | Payer: Medicare Other | Source: Ambulatory Visit | Attending: Internal Medicine | Admitting: Internal Medicine

## 2019-08-07 DIAGNOSIS — Z1231 Encounter for screening mammogram for malignant neoplasm of breast: Secondary | ICD-10-CM | POA: Insufficient documentation

## 2019-08-12 ENCOUNTER — Ambulatory Visit: Payer: Medicare Other | Admitting: Physical Therapy

## 2019-08-15 ENCOUNTER — Encounter: Payer: Self-pay | Admitting: Physical Therapy

## 2019-08-15 ENCOUNTER — Ambulatory Visit: Payer: Medicare Other | Attending: Gastroenterology | Admitting: Physical Therapy

## 2019-08-15 ENCOUNTER — Other Ambulatory Visit: Payer: Self-pay

## 2019-08-15 DIAGNOSIS — R293 Abnormal posture: Secondary | ICD-10-CM | POA: Insufficient documentation

## 2019-08-15 DIAGNOSIS — R278 Other lack of coordination: Secondary | ICD-10-CM

## 2019-08-15 DIAGNOSIS — M6281 Muscle weakness (generalized): Secondary | ICD-10-CM | POA: Diagnosis present

## 2019-08-16 NOTE — Therapy (Signed)
[facility] [address] [place], [place], [address] Phone: [phone]   Fax:  [phone]  Physical Therapy Treatment/Re-Certification  Patient Details  Name: JESSAMY TOROSYAN MRN: [id number] Date of Birth: Nov 07, 1935 Referring Provider (PT): Loistine Simas, MD   Encounter Date: 08/15/2019  PT End of Session - 08/15/19 1310    Visit Number  9    Number of Visits  33    Date for PT Re-Evaluation  11/07/19    Authorization Type  9/10 (IE 03/12/2019)    PT Start Time  1300    PT Stop Time  1353    PT Time Calculation (min)  53 min    Activity Tolerance  Patient tolerated treatment well    Behavior During Therapy  [facility] for tasks assessed/performed       Past Medical History:  Diagnosis Date  . Barrett esophagus   . Cataract cortical, senile   . Degenerative disc disease, cervical   . Depression   . Diverticulosis   . Environmental allergies   . Exostosis   . Fibrocystic breast disease   . Fibrocystic breast disease   . Gastritis   . GERD (gastroesophageal reflux disease)   . Hallux valgus   . HH (hiatus hernia)   . History of degenerative disc disease   . History of shingles   . Hypercholesterolemia   . Hyperlipidemia   . Hypertension   . Internal hemorrhoids   . Menopausal syndrome   . Recurrent sinus infections   . Vaginitis     Past Surgical History:  Procedure Laterality Date  . ABDOMINAL HYSTERECTOMY    . APPENDECTOMY    . BREAST BIOPSY Left 2015   APOCRINE CYST  . BREAST CYST ASPIRATION Left 2015   neg  . BREAST SURGERY     biopsy  . CATARACT EXTRACTION W/ INTRAOCULAR LENS IMPLANTW/ TRABECULECTOMY    . CHOLECYSTECTOMY    . COLONOSCOPY WITH PROPOFOL N/A 08/25/2015   Procedure: COLONOSCOPY WITH PROPOFOL;  Surgeon: Lollie Sails, MD;  Location: [facility] ENDOSCOPY;  Service: Endoscopy;  Laterality: N/A;  . ESOPHAGOGASTRODUODENOSCOPY (EGD) WITH PROPOFOL N/A 08/25/2015   Procedure: ESOPHAGOGASTRODUODENOSCOPY  (EGD) WITH PROPOFOL;  Surgeon: Lollie Sails, MD;  Location: [facility] ENDOSCOPY;  Service: Endoscopy;  Laterality: N/A;  . ESOPHAGOGASTRODUODENOSCOPY (EGD) WITH PROPOFOL N/A 02/20/2018   Procedure: ESOPHAGOGASTRODUODENOSCOPY (EGD) WITH PROPOFOL;  Surgeon: Lollie Sails, MD;  Location: [facility] ENDOSCOPY;  Service: Endoscopy;  Laterality: N/A;  . HERNIA REPAIR    . NISSEN FUNDOPLICATION    . TONSILLECTOMY      There were no vitals filed for this visit.  Subjective Assessment - 08/15/19 1302    Subjective  Patient notes that she has been busy in her yard and has not had much time for her exercises.    How long can you stand comfortably?  15 min    Pain Onset  In the past 7 days      TREATMENT  Neuromuscular Re-education: Seated diaphragmatic breathing with VCs and TCs for downregulation of the nervous system and improved management of IAP Reviewed and assessed goals; see below.  Patient educated extensively using motivational interviewing techniques to determine barriers to consistent participation in HEP and internal motivation factors. Patient articulating good understanding and agreement of internal locus of control as well as satisfaction with current progress and knowledge transfer that has occurred as result of participation in physical therapy.  Patient Response to interventions: Patient in agreement with current POC  and focus on self-management/internal locus of control.  ASSESSMENT Patient presents to clinic with financial concerns regarding continued participation in therapy. Patient continues to demonstrate deficits in spine and hip ROM, PFM strength and coordination, and postural endurance. Patient able to articulate importance of consistent HEP in relation to progress during today's session and responded positively to educational interventions. Patient demonstrating significant improvement regarding impact of PFM dysfunction as evidenced by a 95.5 point improvement on PFIQ-7  outcome measure; absence of fecal smearing/incontinence, improved bowel regularity as evidenced by [facility] Stool Chart Type 3-4 > 75% of BMs in a given week. Patient's condition has the potential to improve in response to therapy. Maximum improvement is yet to be obtained. The anticipated improvement is attainable and reasonable in a generally predictable time with patient understanding of value of HEP to support continued progress and transition to self-management. Patient will benefit from continued skilled therapeutic intervention to address remaining deficits in spine and hip ROM, PFM strength and coordination, and postural endurance in order to decrease pain and incidence of incontinence, increase function, and improve overall QOL.    PT Short Term Goals - 08/15/19 1353      PT SHORT TERM GOAL #1   Title  Patient will demonstrate independence with HEP in order to maximize therapeutic gains and improve carryover from physical therapy sessions to ADLs in the home and community.    Baseline  IE: provided; 06/04/2019: 10/10 confidence    Time  6    Period  Weeks    Status  Achieved    Target Date  04/23/19      PT SHORT TERM GOAL #2   Title  Patient will demonstrate independent and coordinated diaphragmatic breathing in supine with a 1:2 breathing pattern for improved down-regulation of the nervous system and improved management of intra-abdominal pressures in order to increase function at home and in the community.    Baseline  IE: not coordinated; 06/04/2019 has been SOB and had difficulty doing HEP    Time  4    Period  Weeks    Status  Achieved    Target Date  07/02/19      PT SHORT TERM GOAL #3   Title  Patient will demonstrate improved toileting posture with knees higher than hips and feet supported, and will report straining <3x/week with bowel movements in order to decrease incidence of constipation/hemorrhoids/mismanagement of intra-abdominal pressure and improve overall QOL.     Baseline  IE: elevated toilet seat; 06/04/2019: not straining and has elevated foot surface    Time  4    Period  Weeks    Status  Achieved    Target Date  07/02/19        PT Long Term Goals - 08/15/19 1322      PT LONG TERM GOAL #1   Title  Patient will report BMs classified as Type 3-Type 4 on the [facility] Stool Chart greater than 75% of the time to demonstrate improved motility and stool bulking in order to decrease fecal distress and improve overall QOL.    Baseline  IE: 100% Type 6; 06/04/2019: Type 5-6 whenever patient takes Miralax to support BM frequency; 08/15/2019: Type 4 > 75% of the time    Time  12    Period  Weeks    Status  Achieved    Target Date  11/07/19      PT LONG TERM GOAL #2   Title  Patient will indicate at least a 36 point  difference on the PFIQ-7 short form to demonstrate clinically significant improvement for a restoration/improvement in function at home and in the community.    Baseline  IE: provided; 06/04/2019: 109.5/300 (57.1% UIQ; 52.4% CRAIQ); 08/15/2019: 14/300 (4.6% UIQ; 4.6% CRAIQ)    Time  12    Period  Weeks    Status  Achieved    Target Date  11/07/19      PT LONG TERM GOAL #3   Title  Patient will indicate at least a 45 point difference on the PFDI-20 form to demonstrate clinically significant improvement of PFM for a return to PLOF at home and in the community.    Baseline  IE: provided; 06/04/2019: 135.3/300 (UDI 70.8; CRAD 31.25; POPDI 33.25); 08/15/2019: 123.9/300 (UDI 45.8%; CRAD 40.6%; POPDI 37.5%)    Time  12    Period  Weeks    Status  On-going    Target Date  11/07/19      PT LONG TERM GOAL #4   Title  Patient will report >2 weeks without episode of fecal smearing for improved function and participation at home and in the community.    Baseline  IE: daily smearing; 06/04/2019: intermittent bouts of 2-3 day duration followed by none; 08/15/2019: none reported    Time  12    Period  Weeks    Status  Achieved    Target Date  11/07/19      PT  LONG TERM GOAL #5   Title  Patient will report less than 3 incidents of stress urinary incontinence over the course of a week while changing positions against gravity in order to demonstrate improved PFM coordination, strength, and function for improved overall QOL.    Baseline  IE: multiple times/day during sit to stand; 06/04/2019: no change; 08/15/2019: every day, varying amounts    Time  12    Period  Weeks    Status  On-going    Target Date  11/07/19      PT LONG TERM GOAL #6   Title  Patient will decrease worst pain as reported on NPRS by at least 2 points to demonstrate clinically significant reduction in pain in order to restore/improve function and overall QOL.    Baseline  IE: 7/10; 06/04/2019: 5/10; 08/15/2019: 6/10 (pt attributes to yard work)    Time  12    Period  Weeks    Status  On-going    Target Date  11/07/19            Plan - 08/15/19 1326    Clinical Impression Statement  Patient presents to clinic with financial concerns regarding continued participation in therapy. Patient continues to demonstrate deficits in spine and hip ROM, PFM strength and coordination, and postural endurance. Patient able to articulate importance of consistent HEP in relation to progress during today's session and responded positively to educational interventions. Patient demonstrating significant improvement regarding impact of PFM dysfunction as evidenced by a 95.5 point improvement on PFIQ-7 outcome measure; absence of fecal smearing/incontinence, improved bowel regularity as evidenced by [facility] Stool Chart Type 3-4 > 75% of BMs in a given week. Patient's condition has the potential to improve in response to therapy. Maximum improvement is yet to be obtained. The anticipated improvement is attainable and reasonable in a generally predictable time with patient understanding of value of HEP to support continued progress and transition to self-management. Patient will benefit from continued skilled  therapeutic intervention to address remaining deficits in spine and hip ROM, PFM strength  and coordination, and postural endurance in order to decrease pain and incidence of incontinence, increase function, and improve overall QOL.    Personal Factors and Comorbidities  Comorbidity 3+;Age;Time since onset of injury/illness/exacerbation    Comorbidities  HTN, hyperlipidemia, diverticulosis, gastritis, GERD, OA    Examination-Activity Limitations  Continence;Toileting;Stand;Squat;Locomotion Level;Carry;Caring for Others;Bend;Lift;Stairs    Examination-Participation Restrictions  Church;Shop;Cleaning;Yard Work;Community Activity    Stability/Clinical Decision Making  Evolving/Moderate complexity    Rehab Potential  Good    PT Frequency  Monthy    PT Duration  12 weeks    PT Treatment/Interventions  ADLs/Self Care Home Management;Biofeedback;Aquatic Therapy;Cryotherapy;Electrical Stimulation;Moist Heat;Gait training;Stair training;Functional mobility training;Therapeutic activities;Therapeutic exercise;Balance training;Neuromuscular re-education;Patient/family education;Scar mobilization;Manual techniques;Dry needling;Taping    PT Next Visit Plan  progress spinal mobility, side release    PT Home Exercise Plan  Hips elevated PFM contractions 3x daily x10    Consulted and Agree with Plan of Care  Patient       Patient will benefit from skilled therapeutic intervention in order to improve the following deficits and impairments:  Abnormal gait, Increased fascial restricitons, Improper body mechanics, Pain, Decreased coordination, Decreased scar mobility, Postural dysfunction, Decreased strength, Decreased range of motion, Decreased endurance, Difficulty walking  Visit Diagnosis: Abnormal posture  Muscle weakness (generalized)  Other lack of coordination     Problem List There are no active problems to display for this patient.  Sheria LangKatlin Harker PT, DPT [phone]#[id number] 08/16/2019, 10:35 AM  [facility] [address] [place], [place], [id number] Phone: [phone]   Fax:  [phone]  Name: Ernst SpellSylvia R Deats MRN: [id number] Date of Birth: 03/30/1935

## 2019-09-12 ENCOUNTER — Ambulatory Visit: Payer: Medicare Other | Admitting: Physical Therapy

## 2019-11-12 ENCOUNTER — Other Ambulatory Visit
Admission: RE | Admit: 2019-11-12 | Discharge: 2019-11-12 | Disposition: A | Discharge status: Home / Self Care | Payer: Medicare Other | Source: Ambulatory Visit | Attending: Pulmonary Disease | Admitting: Pulmonary Disease

## 2019-11-12 ENCOUNTER — Ambulatory Visit
Admission: RE | Admit: 2019-11-12 | Discharge: 2019-11-12 | Disposition: A | Discharge status: Home / Self Care | Payer: Medicare Other | Source: Ambulatory Visit | Attending: Pulmonary Disease | Admitting: Pulmonary Disease

## 2019-11-12 ENCOUNTER — Other Ambulatory Visit: Payer: Self-pay

## 2019-11-12 ENCOUNTER — Encounter: Payer: Self-pay | Admitting: Pulmonary Disease

## 2019-11-12 ENCOUNTER — Ambulatory Visit: Payer: Medicare Other | Admitting: Pulmonary Disease

## 2019-11-12 VITALS — BP 124/68 | HR 88 | Temp 97.4°F | Ht 68.0 in | Wt 184.2 lb

## 2019-11-12 DIAGNOSIS — R05 Cough: Secondary | ICD-10-CM

## 2019-11-12 DIAGNOSIS — R059 Cough, unspecified: Secondary | ICD-10-CM

## 2019-11-12 LAB — CBC WITH DIFFERENTIAL/PLATELET
Abs Immature Granulocytes: 0.02 10*3/uL (ref 0.00–0.07)
Basophils Absolute: 0 10*3/uL (ref 0.0–0.1)
Basophils Relative: 1 %
Eosinophils Absolute: 0.3 10*3/uL (ref 0.0–0.5)
Eosinophils Relative: 4 %
HCT: 39.7 % (ref 36.0–46.0)
Hemoglobin: 13.3 g/dL (ref 12.0–15.0)
Immature Granulocytes: 0 %
Lymphocytes Relative: 26 %
Lymphs Abs: 1.8 10*3/uL (ref 0.7–4.0)
MCH: 31 pg (ref 26.0–34.0)
MCHC: 33.5 g/dL (ref 30.0–36.0)
MCV: 92.5 fL (ref 80.0–100.0)
Monocytes Absolute: 0.5 10*3/uL (ref 0.1–1.0)
Monocytes Relative: 7 %
Neutro Abs: 4.3 10*3/uL (ref 1.7–7.7)
Neutrophils Relative %: 62 %
Platelets: 194 10*3/uL (ref 150–400)
RBC: 4.29 MIL/uL (ref 3.87–5.11)
RDW: 13 % (ref 11.5–15.5)
WBC: 7 10*3/uL (ref 4.0–10.5)
nRBC: 0 % (ref 0.0–0.2)

## 2019-11-12 MED ORDER — AEROCHAMBER MV MISC: 0 refills | Status: DC

## 2019-11-12 MED ORDER — FLUTICASONE PROPIONATE HFA 110 MCG/ACT IN AERO: 2.0000 | INHALATION_SPRAY | Freq: Two times a day (BID) | RESPIRATORY_TRACT | 2 refills | Status: DC

## 2019-11-12 NOTE — Patient Instructions (Addendum)
1.  I recommend that you take Zyrtec (sertraline) 1 tablet at bedtime  2.  Use the Flovent 2 puffs twice a day.  Rinse your mouth well after use it.  Put a little baking soda in the water that you used to rinse with.  3.  We will schedule breathing tests, and blood tests as well as a chest x-ray.  This will help [place] determine what the cause of your cough is.  In many circumstances because of the cough cannot be ascertained.  It is a matter of trial and error to try to figure out what is triggering it.  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

## 2019-11-14 LAB — IGE: IgE (Immunoglobulin E), Serum: 9 IU/mL (ref 6–495)

## 2019-12-16 ENCOUNTER — Other Ambulatory Visit: Payer: Self-pay

## 2019-12-16 ENCOUNTER — Ambulatory Visit: Payer: Medicare Other | Admitting: Acute Care

## 2019-12-16 ENCOUNTER — Encounter: Payer: Self-pay | Admitting: Acute Care

## 2019-12-16 VITALS — BP 128/80 | HR 58 | Temp 97.8°F | Ht 68.0 in | Wt 184.6 lb

## 2019-12-16 DIAGNOSIS — Z20828 Contact with and (suspected) exposure to other viral communicable diseases: Secondary | ICD-10-CM | POA: Diagnosis not present

## 2019-12-16 DIAGNOSIS — R059 Cough, unspecified: Secondary | ICD-10-CM

## 2019-12-16 DIAGNOSIS — Z20822 Contact with and (suspected) exposure to covid-19: Secondary | ICD-10-CM

## 2019-12-16 DIAGNOSIS — R05 Cough: Secondary | ICD-10-CM | POA: Diagnosis not present

## 2019-12-16 NOTE — Progress Notes (Signed)
History of Present Illness Rachel Harris is a 84 y.o. female never smoker with cough. She is followed by Dr. Marcos EkeGonzales.  Of note: patient's daughter is + for COVID on 12/11/2019 The patient has had direct exposure to her daughter 9 days ago. Pts son in law also tested + for  COVID. Pt was pre screened but was able to secure in office visit? She does not have any symptoms of COVID at present. Nursing Staff exposed>> wearing face mask and shield, but not N95 as patient did not share the above information until she was in the room with the nurse. PFT's are scheduled for 01/13/2019 Will need COVID testing prior  12/22/2020Follow up for Cough Pt was seen 10/2019 with complaint of cough. Dr. Marcos EkeGonzales started patient on Zyrtec, Flovent and scheduled breathing tests,  blood tests and  chest x-ray. IgE was 9, Eosinophils are within normal range, WBC of 7, HGB 13.3, PFT's pending, and CXR showed  mild scarring noted in the posterior left lower lobe. Both lungs are otherwise clear. No active cardiopulmonary disease.  Pt. Presents for follow up. She states she has been compliant with the above regimen.She was unable to take the Zyrtec as it gave her nightmares. She stopped the Zyrtec about 1 week ago. She states her cough is better. She no longer has wheezing upon awakening . She feels she is better. She denies any fever, chest pain, orthopnea or hemoptysis. She plans to substitute claritin for the Zyrtec.She did not cough the entire time she was in the office for follow up.    Test Results: Labs 11/12/2019 IgE>> 9 Eosinophils 4%, 0.3 WBC 7.0 HGB 13.3  Consider adding pepcid at bedtime id recurs  CBC Latest Ref Rng & Units 11/12/2019  WBC 4.0 - 10.5 K/uL 7.0  Hemoglobin 12.0 - 15.0 g/dL 16.113.3  Hematocrit 09.636.0 - 46.0 % 39.7  Platelets 150 - 400 K/uL 194    BMP Latest Ref Rng & Units 01/24/2019  Creatinine 0.44 - 1.00 mg/dL 0.450.80    BNP No results found for: BNP  ProBNP No results found  for: PROBNP  PFT No results found for: FEV1PRE, FEV1POST, FVCPRE, FVCPOST, TLC, DLCOUNC, PREFEV1FVCRT, PSTFEV1FVCRT  No results found.   Past medical hx Past Medical History:  Diagnosis Date  . Barrett esophagus   . Cataract cortical, senile   . Degenerative disc disease, cervical   . Depression   . Diverticulosis   . Environmental allergies   . Exostosis   . Fibrocystic breast disease   . Fibrocystic breast disease   . Gastritis   . GERD (gastroesophageal reflux disease)   . Hallux valgus   . HH (hiatus hernia)   . History of degenerative disc disease   . History of shingles   . Hypercholesterolemia   . Hyperlipidemia   . Hypertension   . Internal hemorrhoids   . Menopausal syndrome   . Recurrent sinus infections   . Vaginitis      Social History   Tobacco Use  . Smoking status: Never Smoker  . Smokeless tobacco: Never Used  Substance Use Topics  . Alcohol use: No  . Drug use: No    Rachel Harris reports that she has never smoked. She has never used smokeless tobacco. She reports that she does not drink alcohol or use drugs.  Tobacco Cessation:Never Smoker  Past surgical hx, Family hx, Social hx all reviewed.  Current Outpatient Medications on File Prior to Visit  Medication Sig  . amitriptyline (ELAVIL)  25 MG tablet Take 25 mg by mouth at bedtime.  Marland Kitchen aspirin EC 81 MG tablet Take 81 mg by mouth daily.  Marland Kitchen estradiol (ESTRACE) 1 MG tablet Take 1 mg by mouth daily.  . fluticasone (FLOVENT HFA) 110 MCG/ACT inhaler Inhale 2 puffs into the lungs 2 (two) times daily.  Marland Kitchen gentamicin ointment (GARAMYCIN) 0.1 % Apply 1 application topically 3 (three) times daily.  . Multiple Vitamins-Minerals (MULTIVITAMIN WITH MINERALS) tablet Take 1 tablet by mouth daily.  Marland Kitchen omeprazole (PRILOSEC) 20 MG capsule Take 20 mg by mouth daily.  Marland Kitchen Spacer/Aero-Holding Chambers (AEROCHAMBER MV) inhaler Use as instructed  . triamcinolone cream (KENALOG) 0.1 % Apply 1 application topically 2 (two)  times daily.   No current facility-administered medications on file prior to visit.     Allergies  Allergen Reactions  . Amoxicillin   . Ceclor [Cefaclor]   . Codeine   . Erythromycin   . Levaquin [Levofloxacin In D5w]   . Macrodantin [Nitrofurantoin Macrocrystal]   . Zithromax [Azithromycin]     Review Of Systems:  Constitutional:   No  weight loss, night sweats,  Fevers, chills, fatigue, or  lassitude.  HEENT:   No headaches,  Difficulty swallowing,  Tooth/dental problems, or  Sore throat,                No sneezing, itching, ear ache, nasal congestion, post nasal drip,   CV:  No chest pain,  Orthopnea, PND, swelling in lower extremities, anasarca, dizziness, palpitations, syncope.   GI  No heartburn, indigestion, abdominal pain, nausea, vomiting, diarrhea, change in bowel habits, loss of appetite, bloody stools.   Resp: No shortness of breath with exertion or at rest.  No excess mucus, no productive cough,  No non-productive cough,  No coughing up of blood.  No change in color of mucus.  No wheezing.  No chest wall deformity  Skin: no rash or lesions.  GU: no dysuria, change in color of urine, no urgency or frequency.  No flank pain, no hematuria   MS:  No joint pain or swelling.  No decreased range of motion.  No back pain.  Psych:  No change in mood or affect. No depression or anxiety.  No memory loss.   Vital Signs BP 128/80 (BP Location: Left Arm, Cuff Size: Normal)   Pulse (!) 58   Temp 97.8 F (36.6 C) (Temporal)   Ht 5\' 8"  (1.727 m)   Wt 184 lb 9.6 oz (83.7 kg)   SpO2 99%   BMI 28.07 kg/m    Physical Exam:  General- No distress,  A&Ox3,, pleasant ENT: No sinus tenderness, TM clear, pale nasal mucosa, no oral exudate,no post nasal drip, no LAN Cardiac: S1, S2, regular rate and rhythm, no murmur Chest: No wheeze/ rales/ dullness; no accessory muscle use, no nasal flaring, no sternal retractions Abd.: Soft Non-tender, ND, BS + Ext: No clubbing cyanosis,  edema Neuro:  normal strength, Cranial nerves intact, appropriate Skin: No rashes, No lesions, warm and dry Psych: normal mood and behavior   Assessment/Plan  Cough Improved with Flovent, Zyrtec Zyrtec gave her bad dreams so stopped taking 1 week ago Plan I'm so glad the plan Dr. Duwayne Heck provided you with has worked.. Consider trying Claritin as an antihistamine in place of the Zyrtec. Sips of water instead of throat clearing Sugar Free [employer] or [employer] originals for throat soothing. Delsym Cough syrup 5 cc's every 12 hours Continue Flovent 2 puffs twice a day.  Rinse your  mouth well after use it.  PFTs 1/19/as you have scheduled, with COVID testing prior Follow up with Dr. Marcos Eke in 3 months. Follow up sooner if needed.  Have a happy holiday.  Exposure to COVID via daughter and SIL Daughter tested + 12/11/2019 Patient was with patient prior to diagnosis on 12/12 Plan We will need to get you COVID tested as your daughter is positive and you were around her while she could have been contagious. . Please call [phone] to schedule. Please call the office with any symptoms of cough, fever, shortness of breath, change in taste or smell.  If test is  +, and visits will need to be done virtually. Consider yourself + until you get your COVID results back, isolate yourself and wear a mask. Please let us know if we can help.  This appointment was 35 min long with over 50% of the time in direct face-to-face patient care, assessment, plan of care, and follow-up.    Bevelyn Ngo, NP 12/17/2019  12:48 PM

## 2019-12-16 NOTE — Patient Instructions (Addendum)
It is nice to meet you today. We will need to get you COVID tested as your daughter is positive and you were around her while she could have been contagious. . Please call [phone] to schedule. I'm so glad the plan Dr. Duwayne Heck provided you with has worked. Continue Flovent as you have been doing. Consider trying Claritin as an antihistamine in place of the Zyrtec. Sips of water instead of throat clearing Sugar Free [employer] or [employer] originals for throat soothing. Delsym Cough syrup 5 cc's every 12 hours Continue Flovent 2 puffs twice a day.  Rinse your mouth well after use it.  PFTs 1/19/as you have scheduled, with COVID testing prior Follow up with Dr. Duwayne Heck in 3 months. Follow up sooner if needed.  Have a happy holiday.

## 2019-12-17 ENCOUNTER — Ambulatory Visit: Payer: Medicare Other | Attending: Internal Medicine

## 2019-12-17 ENCOUNTER — Encounter: Payer: Self-pay | Admitting: Acute Care

## 2019-12-17 DIAGNOSIS — Z20822 Contact with and (suspected) exposure to covid-19: Secondary | ICD-10-CM

## 2019-12-19 LAB — NOVEL CORONAVIRUS, NAA: SARS-CoV-2, NAA: NOT DETECTED

## 2019-12-21 ENCOUNTER — Telehealth: Payer: Self-pay | Admitting: Internal Medicine

## 2019-12-21 NOTE — Telephone Encounter (Signed)
Patient is calling to receive her negative COVID test results. Patent expressed understanding.

## 2019-12-24 NOTE — Progress Notes (Signed)
Thankfully Covid test was negative.

## 2020-01-14 ENCOUNTER — Ambulatory Visit: Payer: Medicare Other

## 2020-01-29 ENCOUNTER — Ambulatory Visit: Payer: Medicare Other | Admitting: Urology

## 2020-01-29 ENCOUNTER — Encounter: Payer: Self-pay | Admitting: Urology

## 2020-01-29 ENCOUNTER — Other Ambulatory Visit: Payer: Self-pay

## 2020-01-29 VITALS — BP 169/82 | HR 106 | Ht 68.0 in | Wt 189.0 lb

## 2020-01-29 DIAGNOSIS — R3912 Poor urinary stream: Secondary | ICD-10-CM | POA: Diagnosis not present

## 2020-01-29 DIAGNOSIS — N8111 Cystocele, midline: Secondary | ICD-10-CM

## 2020-01-29 DIAGNOSIS — R3915 Urgency of urination: Secondary | ICD-10-CM

## 2020-01-29 LAB — BLADDER SCAN AMB NON-IMAGING: Scan Result: 40

## 2020-01-29 NOTE — Progress Notes (Signed)
01/29/2020 2:20 PM   Rachel Harris [birth year] [id number]  Referring provider: Marguarite Arbour, MD [address] [facility] [place],  [place] [address]  Chief Complaint  Patient presents with  . Urinary Retention    HPI: 85 year old female referred for further evaluation of "urinary issues" per Dr. Judithann Sheen.  No documented urinary tract infections over the past year.  Multiple urinalysis completely negative including today's.  She reports today that she is concerned that he is not emptying her bladder well.  Notably today, PVR is minimal.  She has no personal history of urinary retention.  Creatinine is normal.  She reports that her stream is somewhat weak in the morning but also sometimes throughout the day.  She does have some baseline urinary urgency frequency with rare episodes of urge incontinence which can be bothersome.  She voids caffeine and other irritating beverages.  She takes no medications for OAB.  She reports being seen and evaluated by OB/GYN last year.  She was diagnosed with a rectocele and given a pessary.  Larey Seat out after 1 day and she has not gone back or had a replaced.  She is asymptomatic from the bulge but was concerned about this.  Review of my chart data from 2019, it appears the patient saw Dr. Feliberto Gottron.  On this exam, she was noted to have vaginal vault prolapse with a grade 2-3 cystocele and grade 4 rectocele.  She not particularly bothered by this.  No issues with constipation.    PMH: Past Medical History:  Diagnosis Date  . Barrett esophagus   . Cataract cortical, senile   . Degenerative disc disease, cervical   . Depression   . Diverticulosis   . Environmental allergies   . Exostosis   . Fibrocystic breast disease   . Fibrocystic breast disease   . Gastritis   . GERD (gastroesophageal reflux disease)   . Hallux valgus   . HH (hiatus hernia)   . History of degenerative disc disease   . History of shingles   .  Hypercholesterolemia   . Hyperlipidemia   . Hypertension   . Internal hemorrhoids   . Menopausal syndrome   . Recurrent sinus infections   . Vaginitis     Surgical History: Past Surgical History:  Procedure Laterality Date  . ABDOMINAL HYSTERECTOMY    . APPENDECTOMY    . BREAST BIOPSY Left 2015   APOCRINE CYST  . BREAST CYST ASPIRATION Left 2015   neg  . BREAST SURGERY     biopsy  . CATARACT EXTRACTION W/ INTRAOCULAR LENS IMPLANTW/ TRABECULECTOMY    . CHOLECYSTECTOMY    . COLONOSCOPY WITH PROPOFOL N/A 08/25/2015   Procedure: COLONOSCOPY WITH PROPOFOL;  Surgeon: Christena Deem, MD;  Location[facility] ENDOSCOPY;  Service: Endoscopy;  Laterality: N/A;  . ESOPHAGOGASTRODUODENOSCOPY (EGD) WITH PROPOFOL N/A 08/25/2015   Procedure: ESOPHAGOGASTRODUODENOSCOPY (EGD) WITH PROPOFOL;  Surgeon: Christena Deem, MD;  Location: [facility] ENDOSCOPY;  Service: Endoscopy;  Laterality: N/A;  . ESOPHAGOGASTRODUODENOSCOPY (EGD) WITH PROPOFOL N/A 02/20/2018   Procedure: ESOPHAGOGASTRODUODENOSCOPY (EGD) WITH PROPOFOL;  Surgeon: Christena Deem, MD;  Location: [facility] ENDOSCOPY;  Service: Endoscopy;  Laterality: N/A;  . HERNIA REPAIR    . NISSEN FUNDOPLICATION    . TONSILLECTOMY      Home Medications:  Allergies as of 01/29/2020      Reactions   Amoxicillin    Ceclor [cefaclor]    Codeine    Erythromycin    Levaquin [levofloxacin In D5w]  Macrodantin [nitrofurantoin Macrocrystal]    Zithromax [azithromycin]       Medication List       Accurate as of January 29, 2020 11:59 PM. If you have any questions, ask your nurse or doctor.        AeroChamber MV inhaler Use as instructed   amitriptyline 25 MG tablet Commonly known as: ELAVIL Take 25 mg by mouth at bedtime.   aspirin EC 81 MG tablet Take 81 mg by mouth daily.   estradiol 1 MG tablet Commonly known as: ESTRACE Take 1 mg by mouth daily.   fluticasone 110 MCG/ACT inhaler Commonly known as: FLOVENT HFA Inhale 2 puffs into the  lungs 2 (two) times daily.   gentamicin ointment 0.1 % Commonly known as: GARAMYCIN Apply 1 application topically 3 (three) times daily.   multivitamin with minerals tablet Take 1 tablet by mouth daily.   omeprazole 20 MG capsule Commonly known as: PRILOSEC Take 20 mg by mouth daily.   triamcinolone cream 0.1 % Commonly known as: KENALOG Apply 1 application topically 2 (two) times daily.       Allergies:  Allergies  Allergen Reactions  . Amoxicillin   . Ceclor [Cefaclor]   . Codeine   . Erythromycin   . Levaquin [Levofloxacin In D5w]   . Macrodantin [Nitrofurantoin Macrocrystal]   . Zithromax [Azithromycin]     Family History: Family History  Problem Relation Age of Onset  . Breast cancer Sister 45        2 sisters  . Breast cancer Paternal Aunt     Social History:  reports that she has never smoked. She has never used smokeless tobacco. She reports that she does not drink alcohol or use drugs.  ROS: UROLOGY Frequent Urination?: Yes Hard to postpone urination?: Yes Burning/pain with urination?: No Get up at night to urinate?: No Leakage of urine?: Yes Urine stream starts and stops?: Yes Trouble starting stream?: No Do you have to strain to urinate?: No Blood in urine?: No Urinary tract infection?: No Sexually transmitted disease?: No Injury to kidneys or bladder?: No Painful intercourse?: No Weak stream?: Yes Currently pregnant?: No Vaginal bleeding?: No Last menstrual period?: n  Gastrointestinal Nausea?: No Vomiting?: No Indigestion/heartburn?: No Diarrhea?: No Constipation?: No  Constitutional Night sweats?: No Weight loss?: No Fatigue?: No  Skin Skin rash/lesions?: No Itching?: No  Eyes Blurred vision?: No Double vision?: No  Ears/Nose/Throat Sore throat?: No Sinus problems?: No  Hematologic/Lymphatic Swollen glands?: No Easy bruising?: No  Cardiovascular Leg swelling?: No Chest pain?: No  Respiratory Cough?: No  Shortness of breath?: No  Endocrine Excessive thirst?: No  Musculoskeletal Back pain?: No Joint pain?: No  Neurological Headaches?: No Dizziness?: No  Psychologic Depression?: No Anxiety?: No  Physical Exam: BP (!) 169/82   Pulse (!) 106   Ht 5\' 8"  (1.727 m)   Wt 189 lb (85.7 kg)   BMI 28.74 kg/m   Constitutional:  Alert and oriented, No acute distress. HEENT: [place] AT, moist mucus membranes.  Trachea midline, no masses. Cardiovascular: No clubbing, cyanosis, or edema. Respiratory: Normal respiratory effort, no increased work of breathing. Skin: No rashes, bruises or suspicious lesions. Neurologic: Grossly intact, no focal deficits, moving all 4 extremities. Psychiatric: Normal mood and affect.  Laboratory Data: Lab Results  Component Value Date   WBC 7.0 11/12/2019   HGB 13.3 11/12/2019   HCT 39.7 11/12/2019   MCV 92.5 11/12/2019   PLT 194 11/12/2019    Lab Results  Component Value Date  CREATININE 0.80 01/24/2019    Urinalysis UA today negative  Pertinent Imaging: Results for orders placed or performed in visit on 01/29/20  Bladder Scan (Post Void Residual) in office  Result Value Ref Range   Scan Result 40     Assessment & Plan:    1. Weak urinary stream Perceived incomplete bladder emptying although postvoid residual today indicates that she is able to empty without difficulty.    Based on no evidence of UTIs, normal renal function, and no other sequela of bladder outlet obstruction, do not suspect incomplete emptying which is supported by bladder scan today  Patient was reassured and advised to double voiding daily if needed if she feels like she is now all the way empty - Urinalysis, Complete - Bladder Scan (Post Void Residual) in office  2. Midline cystocele Known history of significant pelvic organ prolapse both of the anterior and posterior vaginal vault  Previously tried and failed pessary, not interested in pursuing this.  She is also  interested in surgery.  As such, would not recommend any further intervention.  Pelvic exam deferred today as this was previously documented and will not change management.  3. Urinary urgency Somewhat bothersome to her-discussed behavioral modification  She was offered pharmacotherapy in the form of anticholinergic versus beta 3 agonist.  She is concerned about some of the possible side effects of anticholinergics given her age I also share these concerns.  She is given samples of Myrbetriq 25 mg x 1 month today to see if this helps with her urinary symptoms.  If she like to continue his medication, like to see her back in the office in a few months with a PVR to ensure that she is emptying on these medications.  She is agreeable this plan.  She will call us and let us know.  Vanna Scotland, MD  [facility] [address] [place], [place] [address] [phone]

## 2020-01-30 LAB — URINALYSIS, COMPLETE
Bilirubin, UA: NEGATIVE
Glucose, UA: NEGATIVE
Ketones, UA: NEGATIVE
Leukocytes,UA: NEGATIVE
Nitrite, UA: NEGATIVE
Protein,UA: NEGATIVE
RBC, UA: NEGATIVE
Specific Gravity, UA: 1.02 (ref 1.005–1.030)
Urobilinogen, Ur: 0.2 mg/dL (ref 0.2–1.0)
pH, UA: 5.5 (ref 5.0–7.5)

## 2020-01-30 LAB — MICROSCOPIC EXAMINATION
RBC: NONE SEEN /hpf (ref 0–2)
WBC, UA: NONE SEEN /hpf (ref 0–5)

## 2020-06-04 ENCOUNTER — Encounter: Payer: Self-pay | Admitting: Emergency Medicine

## 2020-06-04 ENCOUNTER — Emergency Department
Admission: EM | Admit: 2020-06-04 | Discharge: 2020-06-04 | Disposition: A | Discharge status: Home / Self Care | Payer: Medicare Other | Attending: Emergency Medicine | Admitting: Emergency Medicine

## 2020-06-04 ENCOUNTER — Emergency Department: Payer: Medicare Other

## 2020-06-04 ENCOUNTER — Other Ambulatory Visit: Payer: Self-pay

## 2020-06-04 DIAGNOSIS — E860 Dehydration: Secondary | ICD-10-CM | POA: Diagnosis not present

## 2020-06-04 DIAGNOSIS — R5383 Other fatigue: Secondary | ICD-10-CM | POA: Insufficient documentation

## 2020-06-04 DIAGNOSIS — I10 Essential (primary) hypertension: Secondary | ICD-10-CM | POA: Insufficient documentation

## 2020-06-04 DIAGNOSIS — Z79899 Other long term (current) drug therapy: Secondary | ICD-10-CM | POA: Diagnosis not present

## 2020-06-04 DIAGNOSIS — R519 Headache, unspecified: Secondary | ICD-10-CM

## 2020-06-04 LAB — CBC WITH DIFFERENTIAL/PLATELET
Abs Immature Granulocytes: 0.04 10*3/uL (ref 0.00–0.07)
Basophils Absolute: 0 10*3/uL (ref 0.0–0.1)
Basophils Relative: 0 %
Eosinophils Absolute: 0.2 10*3/uL (ref 0.0–0.5)
Eosinophils Relative: 2 %
HCT: 43.3 % (ref 36.0–46.0)
Hemoglobin: 14.5 g/dL (ref 12.0–15.0)
Immature Granulocytes: 0 %
Lymphocytes Relative: 9 %
Lymphs Abs: 1 10*3/uL (ref 0.7–4.0)
MCH: 32.1 pg (ref 26.0–34.0)
MCHC: 33.5 g/dL (ref 30.0–36.0)
MCV: 95.8 fL (ref 80.0–100.0)
Monocytes Absolute: 0.5 10*3/uL (ref 0.1–1.0)
Monocytes Relative: 5 %
Neutro Abs: 9.3 10*3/uL — ABNORMAL HIGH (ref 1.7–7.7)
Neutrophils Relative %: 84 %
Platelets: 206 10*3/uL (ref 150–400)
RBC: 4.52 MIL/uL (ref 3.87–5.11)
RDW: 12.4 % (ref 11.5–15.5)
WBC: 11 10*3/uL — ABNORMAL HIGH (ref 4.0–10.5)
nRBC: 0 % (ref 0.0–0.2)

## 2020-06-04 LAB — URINALYSIS, COMPLETE (UACMP) WITH MICROSCOPIC
Bacteria, UA: NONE SEEN
Bilirubin Urine: NEGATIVE
Glucose, UA: NEGATIVE mg/dL
Hgb urine dipstick: NEGATIVE
Ketones, ur: NEGATIVE mg/dL
Leukocytes,Ua: NEGATIVE
Nitrite: POSITIVE — AB
Protein, ur: NEGATIVE mg/dL
Specific Gravity, Urine: 1.01 (ref 1.005–1.030)
pH: 6 (ref 5.0–8.0)

## 2020-06-04 LAB — COMPREHENSIVE METABOLIC PANEL
ALT: 33 U/L (ref 0–44)
AST: 46 U/L — ABNORMAL HIGH (ref 15–41)
Albumin: 3.8 g/dL (ref 3.5–5.0)
Alkaline Phosphatase: 102 U/L (ref 38–126)
Anion gap: 8 (ref 5–15)
BUN: 10 mg/dL (ref 8–23)
CO2: 23 mmol/L (ref 22–32)
Calcium: 8.4 mg/dL — ABNORMAL LOW (ref 8.9–10.3)
Chloride: 100 mmol/L (ref 98–111)
Creatinine, Ser: 0.8 mg/dL (ref 0.44–1.00)
GFR calc Af Amer: >60 mL/min (ref >60)
GFR calc non Af Amer: >60 mL/min (ref >60)
Glucose, Bld: 106 mg/dL — ABNORMAL HIGH (ref 70–99)
Potassium: 4.4 mmol/L (ref 3.5–5.1)
Sodium: 131 mmol/L — ABNORMAL LOW (ref 135–145)
Total Bilirubin: 0.8 mg/dL (ref 0.3–1.2)
Total Protein: 7.8 g/dL (ref 6.5–8.1)

## 2020-06-04 LAB — TROPONIN I (HIGH SENSITIVITY): Troponin I (High Sensitivity): 4 ng/L (ref <18)

## 2020-06-04 MED ORDER — SODIUM CHLORIDE 0.9 % IV BOLUS
1000.0000 mL | Freq: Once | INTRAVENOUS | Status: AC
  Administered 2020-06-04: 1000 mL via INTRAVENOUS

## 2020-06-04 MED ORDER — ONDANSETRON 4 MG PO TBDP
4.0000 mg | ORAL_TABLET | Freq: Three times a day (TID) | ORAL | 0 refills | Status: DC | PRN
Start: 2020-06-04 — End: 2021-07-12

## 2020-06-04 MED ORDER — ONDANSETRON HCL 4 MG/2ML IJ SOLN
4.0000 mg | Freq: Once | INTRAMUSCULAR | Status: AC
  Administered 2020-06-04: 4 mg via INTRAVENOUS
  Filled 2020-06-04: qty 2

## 2020-06-04 NOTE — ED Provider Notes (Signed)
[facility] Emergency Department Provider Note  ____________________________________________  Time seen: Approximately 9:10 AM  I have reviewed the triage vital signs and the nursing notes.   HISTORY  Chief Complaint Headache and Weakness    HPI Rachel Harris is a 85 y.o. female with a past history of diverticulosis, gastritis, hypertension who comes the ED complaining of bilateral frontal headache on and off for the past 3 weeks.  No aggravating or alleviating factors, lasts for about half an hour then resolves.  Associated with a feeling of fatigue.  No dizziness vision changes paresthesias weakness or syncope.  She does note that she has had decreased appetite for the last few weeks as well, not eating and drinking as much as normal.  She was recently diagnosed with UTI by her primary care doctor, started on Bactrim which was changed to cefdinir 2 days ago due to urine culture showing pansensitive Pseudomonas.  She started the cefdinir yesterday morning.  States that symptoms feel like previous episodes of dehydration.      Past Medical History:  Diagnosis Date  . Barrett esophagus   . Cataract cortical, senile   . Degenerative disc disease, cervical   . Depression   . Diverticulosis   . Environmental allergies   . Exostosis   . Fibrocystic breast disease   . Fibrocystic breast disease   . Gastritis   . GERD (gastroesophageal reflux disease)   . Hallux valgus   . HH (hiatus hernia)   . History of degenerative disc disease   . History of shingles   . Hypercholesterolemia   . Hyperlipidemia   . Hypertension   . Internal hemorrhoids   . Menopausal syndrome   . Recurrent sinus infections   . Vaginitis      There are no problems to display for this patient.    Past Surgical History:  Procedure Laterality Date  . ABDOMINAL HYSTERECTOMY    . APPENDECTOMY    . BREAST BIOPSY Left 2015   APOCRINE CYST  . BREAST CYST ASPIRATION Left 2015    neg  . BREAST SURGERY     biopsy  . CATARACT EXTRACTION W/ INTRAOCULAR LENS IMPLANTW/ TRABECULECTOMY    . CHOLECYSTECTOMY    . COLONOSCOPY WITH PROPOFOL N/A 08/25/2015   Procedure: COLONOSCOPY WITH PROPOFOL;  Surgeon: Christena Deem, MD;  Location[facility] ENDOSCOPY;  Service: Endoscopy;  Laterality: N/A;  . ESOPHAGOGASTRODUODENOSCOPY (EGD) WITH PROPOFOL N/A 08/25/2015   Procedure: ESOPHAGOGASTRODUODENOSCOPY (EGD) WITH PROPOFOL;  Surgeon: Christena Deem, MD;  Location: [facility] ENDOSCOPY;  Service: Endoscopy;  Laterality: N/A;  . ESOPHAGOGASTRODUODENOSCOPY (EGD) WITH PROPOFOL N/A 02/20/2018   Procedure: ESOPHAGOGASTRODUODENOSCOPY (EGD) WITH PROPOFOL;  Surgeon: Christena Deem, MD;  Location: [facility] ENDOSCOPY;  Service: Endoscopy;  Laterality: N/A;  . HERNIA REPAIR    . NISSEN FUNDOPLICATION    . TONSILLECTOMY       Prior to Admission medications   Medication Sig Start Date End Date Taking? Authorizing Provider  estradiol (ESTRACE) 1 MG tablet Take 1 mg by mouth daily.   Yes [provider]  omeprazole (PRILOSEC) 20 MG capsule Take 20 mg by mouth daily.   Yes [provider]  sulfamethoxazole-trimethoprim (BACTRIM DS) 800-160 MG tablet Take 1 tablet by mouth 2 (two) times daily. 05/28/20  Yes [provider]  amitriptyline (ELAVIL) 25 MG tablet Take 25 mg by mouth at bedtime. Patient not taking: Reported on 06/04/2020    [provider]  ascorbic acid (VITAMIN C) 1000 MG tablet  Take by mouth.    [provider]  aspirin EC 81 MG tablet Take 81 mg by mouth daily.    [provider]  cefdinir (OMNICEF) 300 MG capsule Take 300 mg by mouth 2 (two) times daily. 06/03/20   [provider]  fluorouracil (EFUDEX) 5 % cream Apply topically. Patient not taking: Reported on 06/04/2020 04/02/20 04/02/21  [provider]  fluticasone (FLOVENT HFA) 110 MCG/ACT inhaler Inhale 2 puffs into the lungs 2 (two) times daily. 11/12/19 11/11/20   Salena Saner, MD  gentamicin ointment (GARAMYCIN) 0.1 % Apply 1 application topically 3 (three) times daily.    [provider]  Multiple Vitamins-Minerals (MULTIVITAMIN WITH MINERALS) tablet Take 1 tablet by mouth daily.    [provider]  ondansetron (ZOFRAN ODT) 4 MG disintegrating tablet Take 1 tablet (4 mg total) by mouth every 8 (eight) hours as needed for nausea or vomiting. 06/04/20   Sharman Cheek, MD  Spacer/Aero-Holding Chambers (AEROCHAMBER MV) inhaler Use as instructed 11/12/19   Salena Saner, MD  triamcinolone cream (KENALOG) 0.1 % Apply 1 application topically 2 (two) times daily. Patient not taking: Reported on 06/04/2020    [provider]     Allergies Amoxicillin, Ceclor [cefaclor], Codeine, Erythromycin, Levaquin [levofloxacin in d5w], Macrodantin [nitrofurantoin macrocrystal], and Zithromax [azithromycin]   Family History  Problem Relation Age of Onset  . Breast cancer Sister 38        2 sisters  . Breast cancer Paternal Aunt     Social History Social History   Tobacco Use  . Smoking status: Never Smoker  . Smokeless tobacco: Never Used  Substance Use Topics  . Alcohol use: No  . Drug use: No    Review of Systems  Constitutional:   No fever or chills.  ENT:   No sore throat. No rhinorrhea. Cardiovascular:   No chest pain or syncope. Respiratory:   No dyspnea or cough. Gastrointestinal:   Negative for abdominal pain, vomiting and diarrhea.  Musculoskeletal:   Negative for focal pain or swelling All other systems reviewed and are negative except as documented above in ROS and HPI.  ____________________________________________   PHYSICAL EXAM:  VITAL SIGNS: ED Triage Vitals [06/04/20 0449]  Enc Vitals Group     BP (!) 146/71     Pulse Rate (!) 103     Resp 18     Temp 98.7 F (37.1 C)     Temp Source Oral     SpO2 95 %     Weight 188 lb (85.3 kg)     Height 5\' 6"  (1.676 m)     Head Circumference       Peak Flow      Pain Score 7     Pain Loc      Pain Edu?      Excl. in GC?     Vital signs reviewed, nursing assessments reviewed.   Constitutional:   Alert and oriented. Non-toxic appearance. Eyes:   Conjunctivae are normal. EOMI. PERRL. ENT      Head:   Normocephalic and atraumatic.      Nose:   Normal .      Mouth/Throat:   Dry mucous membranes.      Neck:   No meningismus. Full ROM.  Thyroid nonpalpable Hematological/Lymphatic/Immunilogical:   No cervical lymphadenopathy. Cardiovascular:   Slight tachycardia, heart rate 90. Symmetric bilateral radial and DP pulses.  No murmurs. Cap refill less than 2 seconds. Respiratory:   Normal  respiratory effort without tachypnea/retractions. Breath sounds are clear and equal bilaterally. No wheezes/rales/rhonchi. Gastrointestinal:   Soft with mild suprapubic tenderness. Non distended. There is no CVA tenderness.  No rebound, rigidity, or guarding. Musculoskeletal:   Normal range of motion in all extremities. No joint effusions.  No lower extremity tenderness.  No edema. Neurologic:   Normal speech and language.  Motor grossly intact. No acute focal neurologic deficits are appreciated.  Skin:    Skin is warm, dry and intact. No rash noted.  No petechiae, purpura, or bullae.  ____________________________________________    LABS (pertinent positives/negatives) (all labs ordered are listed, but only abnormal results are displayed) Labs Reviewed  CBC WITH DIFFERENTIAL/PLATELET - Abnormal; Notable for the following components:      Result Value   WBC 11.0 (*)    Neutro Abs 9.3 (*)    All other components within normal limits  COMPREHENSIVE METABOLIC PANEL - Abnormal; Notable for the following components:   Sodium 131 (*)    Glucose, Bld 106 (*)    Calcium 8.4 (*)    AST 46 (*)    All other components within normal limits  URINALYSIS, COMPLETE (UACMP) WITH MICROSCOPIC - Abnormal; Notable for the following components:   Color, Urine  AMBER (*)    APPearance HAZY (*)    Nitrite POSITIVE (*)    All other components within normal limits  TROPONIN I (HIGH SENSITIVITY)   ____________________________________________   EKG Interpreted by me Sinus tachycardia rate 110.  Normal axis and intervals.  Poor R wave progression.  Normal ST segments and T waves.   ____________________________________________    RADIOLOGY  CT Head Wo Contrast  Result Date: 06/04/2020 CLINICAL DATA:  Headache for 3 weeks with weakness EXAM: CT HEAD WITHOUT CONTRAST TECHNIQUE: Contiguous axial images were obtained from the base of the skull through the vertex without intravenous contrast. COMPARISON:  None. FINDINGS: Brain: No evidence of acute infarction, hemorrhage, hydrocephalus, extra-axial collection or mass lesion/mass effect. Symmetric prominence of the ventricles, cisterns and sulci compatible with parenchymal volume loss. Patchy areas of white matter hypoattenuation are most compatible with chronic microvascular angiopathy. Senescent mineralization is are seen in the basal ganglia. Vascular: Atherosclerotic calcification of the carotid siphons. No hyperdense vessel. Skull: No scalp swelling or hematoma. No calvarial fracture. Scattered sessile benign calvarial osteoma are noted (3/24, 21 for example). No worrisome osseous lesion. Sinuses/Orbits: Paranasal sinuses and mastoid air cells are predominantly clear. Included orbital structures are unremarkable. Other: None IMPRESSION: 1. No acute intracranial findings. 2. Chronic microvascular angiopathy and parenchymal volume loss. 3. 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 Electronically Signed   By: Kreg Shropshire M.D.   On: 06/04/2020 05:22    ____________________________________________   PROCEDURES Procedures  ____________________________________________  DIFFERENTIAL DIAGNOSIS   Intracranial hemorrhage, tumor, dehydration, malaise secondary to cystitis  CLINICAL IMPRESSION /  ASSESSMENT AND PLAN / ED COURSE  Medications ordered in the ED: Medications  sodium chloride 0.9 % bolus 1,000 mL (1,000 mLs Intravenous New Bag/Given 06/04/20 0836)  ondansetron (ZOFRAN) injection 4 mg (4 mg Intravenous Given 06/04/20 3532)    Pertinent labs & imaging results that were available during my care of the patient were reviewed by me and considered in my medical decision making (see chart for details).  Rachel Harris was evaluated in Emergency Department on 06/04/2020 for the symptoms described in the history of present illness. She was evaluated in the context of the global COVID-19 pandemic, which necessitated consideration that the patient might be at  risk for infection with the SARS-CoV-2 virus that causes COVID-19. Institutional protocols and algorithms that pertain to the evaluation of patients at risk for COVID-19 are in a state of rapid change based on information released by regulatory bodies including the CDC and federal and state organizations. These policies and algorithms were followed during the patient's care in the ED.   Patient presents with waxing and waning constitutional symptoms including frontal headache, fatigue, decreased appetite resulting in dehydration.  Vital signs significant for mild tachycardia which is improved on exam compared to triage.  Rest of the exam is reassuring.  Labs are reassuring.  Electronic medical record reviewed, urine culture from PCP reviewed, Omnicef should be efficacious.  I will add on Zofran for symptom relief, give IV fluids for hydration today.  Patient would be stable for outpatient follow-up with primary care.  Based on history exam and lab findings today, I doubt meningitis encephalitis intracranial hypertension carotid or vertebrobasilar obstruction, stroke, pyelonephritis or sepsis.      ____________________________________________   FINAL CLINICAL IMPRESSION(S) / ED DIAGNOSES    Final diagnoses:  Dehydration  Frontal  headache  Cystitis   ED Discharge Orders         Ordered    ondansetron (ZOFRAN ODT) 4 MG disintegrating tablet  Every 8 hours PRN     Discontinue  Reprint     06/04/20 0909          Portions of this note were generated with dragon dictation software. Dictation errors may occur despite best attempts at proofreading.   Carrie Mew, MD 06/04/20 [phone]

## 2020-06-04 NOTE — ED Triage Notes (Signed)
Pt to triage via w/c with no distress noted; Pt reports frontal HA x 3wks accomp by weakness; st currently taking an antibiotic for a UTI

## 2020-06-29 IMAGING — CR DG CHEST 2V
1 series · 2 of 2 positions shown · non-contrast
Comparison: Twenty-three

CLINICAL DATA: Productive cough and chest congestion for
approximately 6 months.

EXAM:
CHEST - 2 VIEW

[Series 1: dg chest 2 view · 0.14mm/px · 2 of 2 slices shown]
[im 1/2]
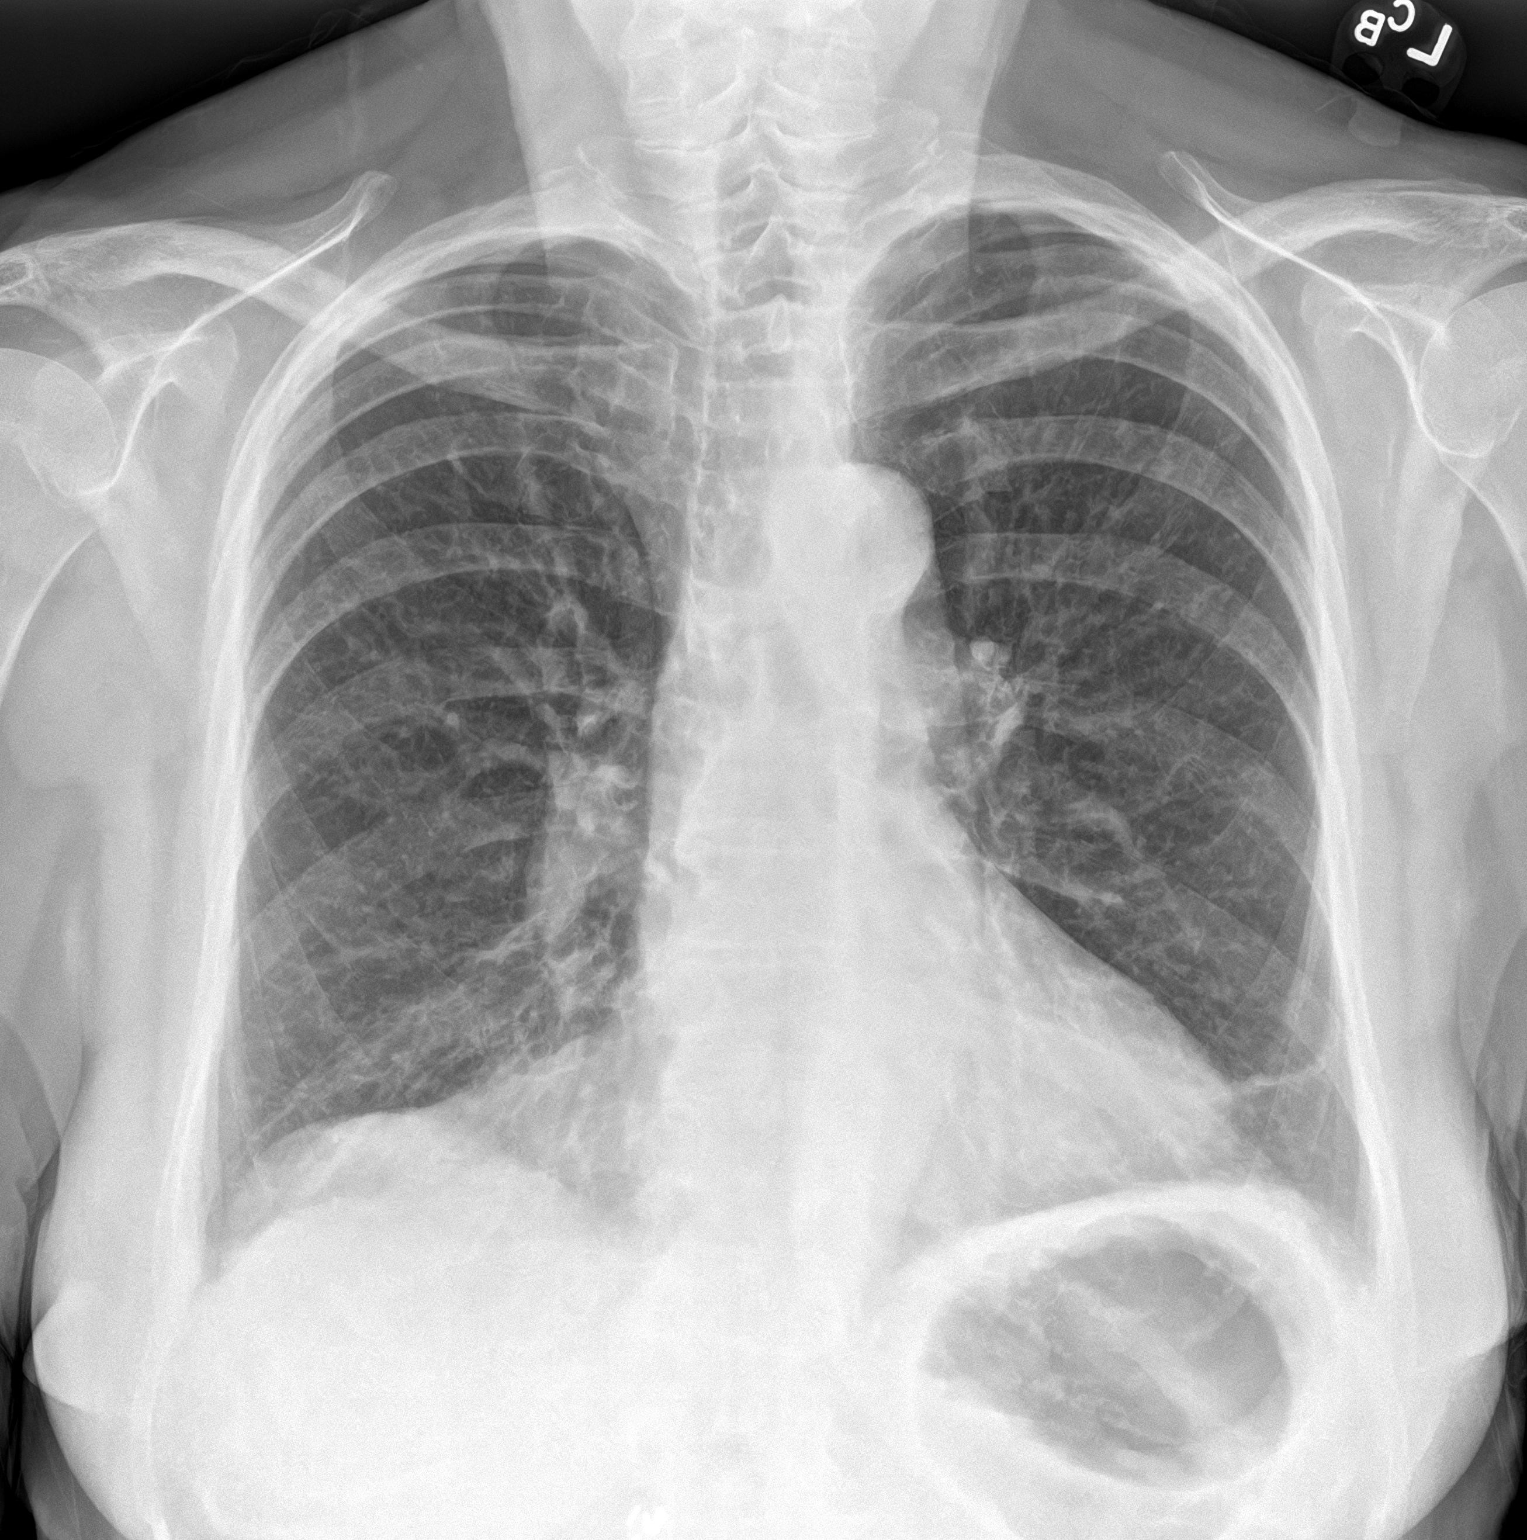
[im 2/2]
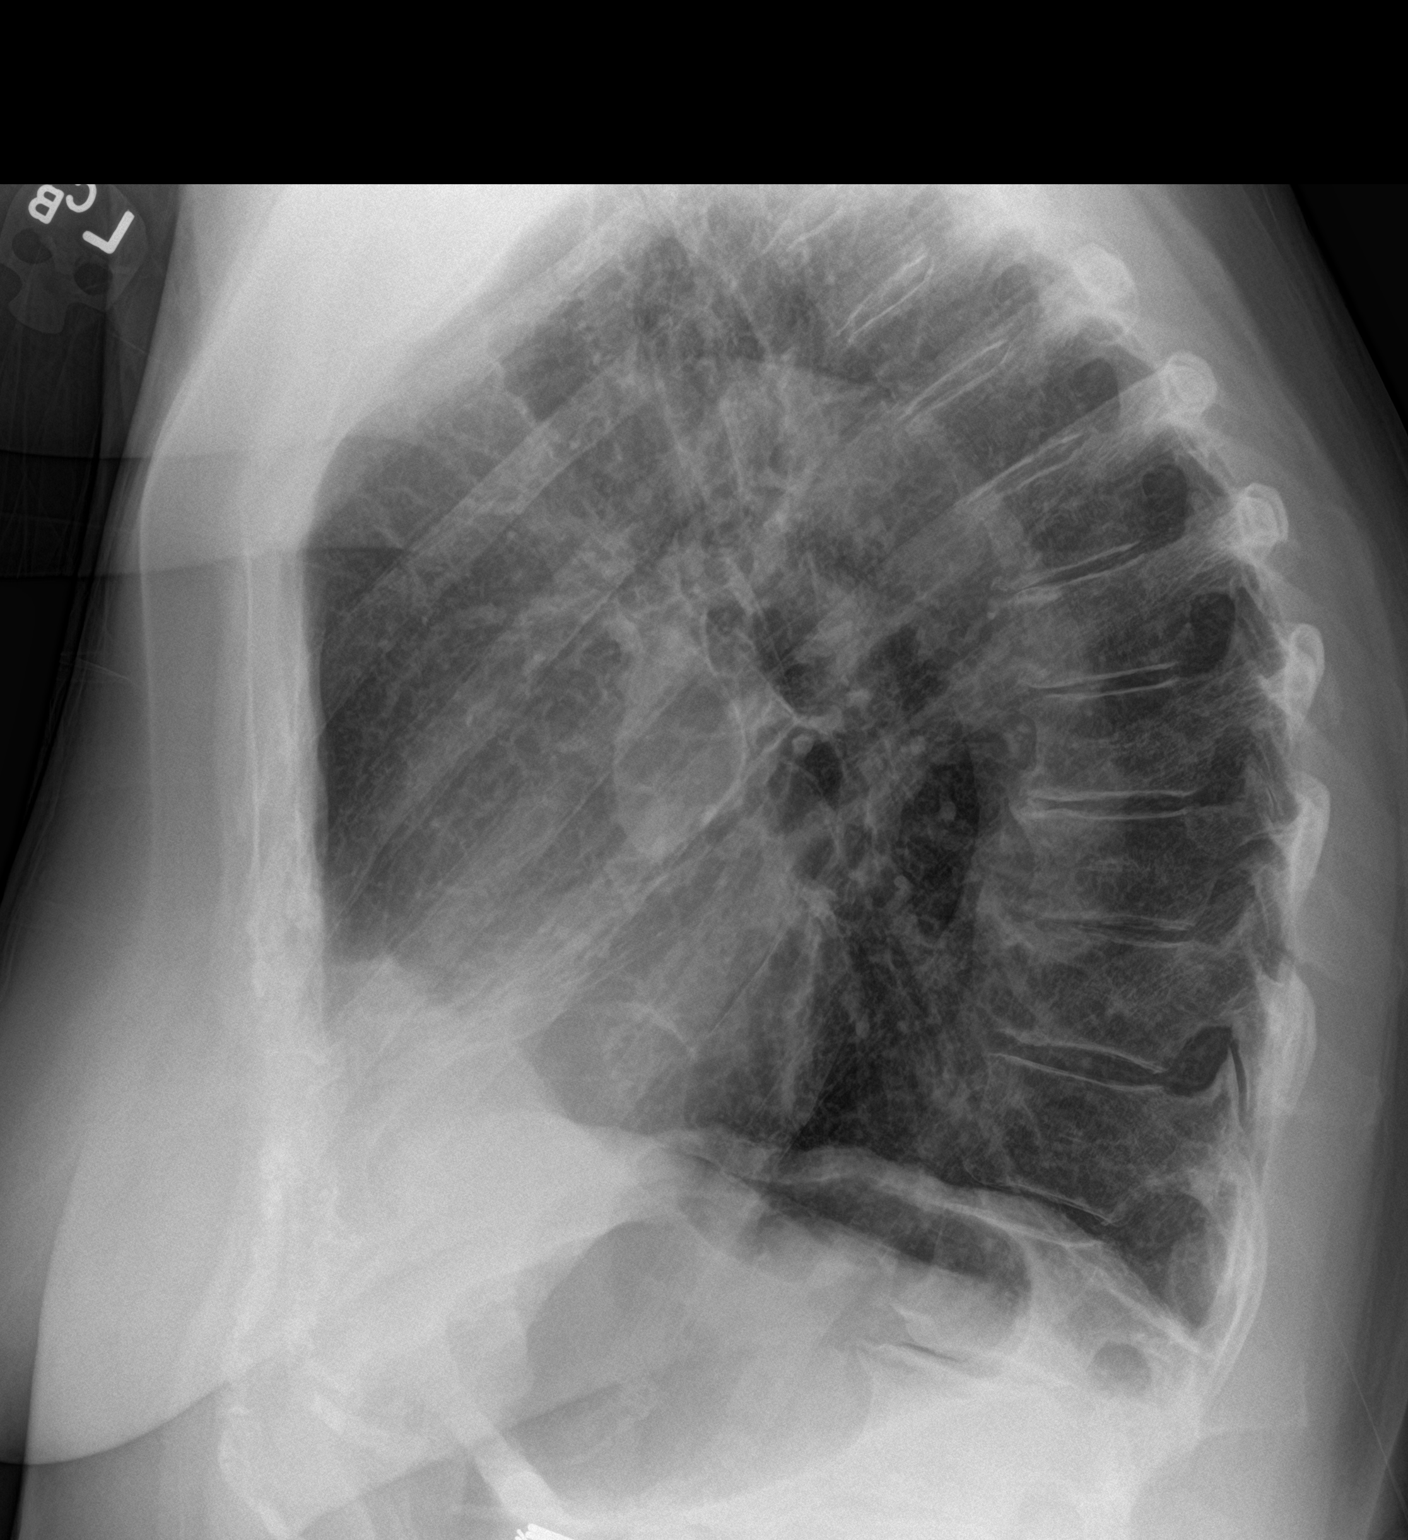

[2 of 2 positions shown; findings below may reference images not displayed]

FINDINGS: The heart size and mediastinal contours are within normal limits.
Mild scarring noted in the posterior left lower lobe. Both lungs are
otherwise clear. The visualized skeletal structures are
unremarkable.
IMPRESSION: No active cardiopulmonary disease.

## 2020-08-10 ENCOUNTER — Other Ambulatory Visit: Payer: Self-pay | Admitting: Internal Medicine

## 2020-08-10 DIAGNOSIS — Z1231 Encounter for screening mammogram for malignant neoplasm of breast: Secondary | ICD-10-CM

## 2020-09-01 ENCOUNTER — Ambulatory Visit
Admission: RE | Admit: 2020-09-01 | Discharge: 2020-09-01 | Disposition: A | Discharge status: Home / Self Care | Payer: Medicare Other | Source: Ambulatory Visit | Attending: Internal Medicine | Admitting: Internal Medicine

## 2020-09-01 DIAGNOSIS — Z1231 Encounter for screening mammogram for malignant neoplasm of breast: Secondary | ICD-10-CM | POA: Insufficient documentation

## 2021-05-10 ENCOUNTER — Other Ambulatory Visit
Admission: RE | Admit: 2021-05-10 | Discharge: 2021-05-10 | Disposition: A | Discharge status: Home / Self Care | Payer: Medicare Other | Source: Ambulatory Visit | Attending: Student | Admitting: Student

## 2021-05-10 DIAGNOSIS — R5383 Other fatigue: Secondary | ICD-10-CM | POA: Insufficient documentation

## 2021-05-10 DIAGNOSIS — R0602 Shortness of breath: Secondary | ICD-10-CM | POA: Insufficient documentation

## 2021-05-10 LAB — D-DIMER, QUANTITATIVE: D-Dimer, Quant: 2.4 ug/mL-FEU — ABNORMAL HIGH (ref 0.00–0.50)

## 2021-05-11 ENCOUNTER — Emergency Department: Payer: Medicare Other

## 2021-05-11 ENCOUNTER — Other Ambulatory Visit: Payer: Self-pay

## 2021-05-11 ENCOUNTER — Emergency Department
Admission: EM | Admit: 2021-05-11 | Discharge: 2021-05-11 | Disposition: A | Discharge status: Home / Self Care | Payer: Medicare Other | Attending: Emergency Medicine | Admitting: Emergency Medicine

## 2021-05-11 DIAGNOSIS — I10 Essential (primary) hypertension: Secondary | ICD-10-CM | POA: Diagnosis not present

## 2021-05-11 DIAGNOSIS — Z7982 Long term (current) use of aspirin: Secondary | ICD-10-CM | POA: Insufficient documentation

## 2021-05-11 DIAGNOSIS — Z8616 Personal history of COVID-19: Secondary | ICD-10-CM | POA: Insufficient documentation

## 2021-05-11 DIAGNOSIS — R7989 Other specified abnormal findings of blood chemistry: Secondary | ICD-10-CM | POA: Diagnosis not present

## 2021-05-11 DIAGNOSIS — R0602 Shortness of breath: Secondary | ICD-10-CM | POA: Insufficient documentation

## 2021-05-11 DIAGNOSIS — R5383 Other fatigue: Secondary | ICD-10-CM | POA: Diagnosis not present

## 2021-05-11 DIAGNOSIS — R531 Weakness: Secondary | ICD-10-CM | POA: Insufficient documentation

## 2021-05-11 LAB — BASIC METABOLIC PANEL
Anion gap: 7 (ref 5–15)
BUN: 17 mg/dL (ref 8–23)
CO2: 26 mmol/L (ref 22–32)
Calcium: 8.6 mg/dL — ABNORMAL LOW (ref 8.9–10.3)
Chloride: 102 mmol/L (ref 98–111)
Creatinine, Ser: 0.71 mg/dL (ref 0.44–1.00)
GFR, Estimated: >60 mL/min (ref >60)
Glucose, Bld: 97 mg/dL (ref 70–99)
Potassium: 3.6 mmol/L (ref 3.5–5.1)
Sodium: 135 mmol/L (ref 135–145)

## 2021-05-11 LAB — URINALYSIS, COMPLETE (UACMP) WITH MICROSCOPIC
Bilirubin Urine: NEGATIVE
Glucose, UA: NEGATIVE mg/dL
Hgb urine dipstick: NEGATIVE
Ketones, ur: NEGATIVE mg/dL
Leukocytes,Ua: NEGATIVE
Nitrite: NEGATIVE
Protein, ur: NEGATIVE mg/dL
Specific Gravity, Urine: 1.016 (ref 1.005–1.030)
pH: 7 (ref 5.0–8.0)

## 2021-05-11 LAB — CBC
HCT: 38.5 % (ref 36.0–46.0)
Hemoglobin: 13.3 g/dL (ref 12.0–15.0)
MCH: 32.3 pg (ref 26.0–34.0)
MCHC: 34.5 g/dL (ref 30.0–36.0)
MCV: 93.4 fL (ref 80.0–100.0)
Platelets: 256 10*3/uL (ref 150–400)
RBC: 4.12 MIL/uL (ref 3.87–5.11)
RDW: 12.6 % (ref 11.5–15.5)
WBC: 5.9 10*3/uL (ref 4.0–10.5)
nRBC: 0 % (ref 0.0–0.2)

## 2021-05-11 LAB — TROPONIN I (HIGH SENSITIVITY): Troponin I (High Sensitivity): 5 ng/L (ref <18)

## 2021-05-11 LAB — PROCALCITONIN: Procalcitonin: <0.1 ng/mL

## 2021-05-11 LAB — D-DIMER, QUANTITATIVE: D-Dimer, Quant: 2.17 ug/mL-FEU — ABNORMAL HIGH (ref 0.00–0.50)

## 2021-05-11 LAB — MAGNESIUM: Magnesium: 1.9 mg/dL (ref 1.7–2.4)

## 2021-05-11 MED ORDER — IOHEXOL 350 MG/ML SOLN
75.0000 mL | Freq: Once | INTRAVENOUS | Status: AC | PRN
  Administered 2021-05-11: 75 mL via INTRAVENOUS
  Filled 2021-05-11: qty 75

## 2021-05-11 NOTE — ED Triage Notes (Signed)
Pt comes with c/o weakness. Pt states this started several weeks back since having COVID. Pt states some SOB and she gets weak. Pt states more weakness with exertion.  Pt denies any CP or dizziness.

## 2021-05-11 NOTE — ED Provider Notes (Signed)
[facility] Emergency Department Provider Note  ____________________________________________   Event Date/Time   First MD Initiated Contact with Patient 05/11/21 1238     (approximate)  I have reviewed the triage vital signs and the nursing notes.   HISTORY  Chief Complaint Weakness   HPI SHANIQUE ASLINGER is a 86 y.o. female with past medical history of HTN, HDL, DJD, GERD, diverticulosis, depression, Barrett's esophagus and COVID-19 infection diagnosed 4/16 who presents for assessment of a couple of weeks of generalized weakness associate with some shortness of breath with exertion.  She denies any dizziness, lightheadedness chest pain or recent fevers.  Denies any headache, vertigo, vision changes, change in cough over the last couple of weeks, acute back pain, urinary symptoms, abdominal pain, diarrhea, rash or focal extremity pain weakness numbness or tingling.  No recent falls or injuries.  She states she was seen in urgent care and advised to come to the ED but has an appointment with her PCP for next month.  She states she does want to get checked out as her symptoms are 9 different today than usual as well as over the last couple of weeks but she feels she is just not getting better and is feeling very tired.         Past Medical History:  Diagnosis Date  . Barrett esophagus   . Cataract cortical, senile   . Degenerative disc disease, cervical   . Depression   . Diverticulosis   . Environmental allergies   . Exostosis   . Fibrocystic breast disease   . Fibrocystic breast disease   . Gastritis   . GERD (gastroesophageal reflux disease)   . Hallux valgus   . HH (hiatus hernia)   . History of degenerative disc disease   . History of shingles   . Hypercholesterolemia   . Hyperlipidemia   . Hypertension   . Internal hemorrhoids   . Menopausal syndrome   . Recurrent sinus infections   . Vaginitis     There are no problems to display for  this patient.   Past Surgical History:  Procedure Laterality Date  . ABDOMINAL HYSTERECTOMY    . APPENDECTOMY    . BREAST BIOPSY Left 2015   APOCRINE CYST  . BREAST CYST ASPIRATION Left 2015   neg  . BREAST SURGERY     biopsy  . CATARACT EXTRACTION W/ INTRAOCULAR LENS IMPLANTW/ TRABECULECTOMY    . CHOLECYSTECTOMY    . COLONOSCOPY WITH PROPOFOL N/A 08/25/2015   Procedure: COLONOSCOPY WITH PROPOFOL;  Surgeon: Christena Deem, MD;  Location: [facility] ENDOSCOPY;  Service: Endoscopy;  Laterality: N/A;  . ESOPHAGOGASTRODUODENOSCOPY (EGD) WITH PROPOFOL N/A 08/25/2015   Procedure: ESOPHAGOGASTRODUODENOSCOPY (EGD) WITH PROPOFOL;  Surgeon: Christena Deem, MD;  Location: [facility] ENDOSCOPY;  Service: Endoscopy;  Laterality: N/A;  . ESOPHAGOGASTRODUODENOSCOPY (EGD) WITH PROPOFOL N/A 02/20/2018   Procedure: ESOPHAGOGASTRODUODENOSCOPY (EGD) WITH PROPOFOL;  Surgeon: Christena Deem, MD;  Location: [facility] ENDOSCOPY;  Service: Endoscopy;  Laterality: N/A;  . HERNIA REPAIR    . NISSEN FUNDOPLICATION    . TONSILLECTOMY      Prior to Admission medications   Medication Sig Start Date End Date Taking? Authorizing Provider  amitriptyline (ELAVIL) 25 MG tablet Take 25 mg by mouth at bedtime. Patient not taking: Reported on 06/04/2020    [provider]  ascorbic acid (VITAMIN C) 1000 MG tablet Take by mouth.    [provider]  aspirin EC 81 MG tablet Take 81 mg  by mouth daily.    [provider]  cefdinir (OMNICEF) 300 MG capsule Take 300 mg by mouth 2 (two) times daily. 06/03/20   [provider]  estradiol (ESTRACE) 1 MG tablet Take 1 mg by mouth daily.    [provider]  fluticasone (FLOVENT HFA) 110 MCG/ACT inhaler Inhale 2 puffs into the lungs 2 (two) times daily. 11/12/19 11/11/20  Salena Saner, MD  gentamicin ointment (GARAMYCIN) 0.1 % Apply 1 application topically 3 (three) times daily.    [provider]  Multiple Vitamins-Minerals  (MULTIVITAMIN WITH MINERALS) tablet Take 1 tablet by mouth daily.    [provider]  omeprazole (PRILOSEC) 20 MG capsule Take 20 mg by mouth daily.    [provider]  ondansetron (ZOFRAN ODT) 4 MG disintegrating tablet Take 1 tablet (4 mg total) by mouth every 8 (eight) hours as needed for nausea or vomiting. 06/04/20   Sharman Cheek, MD  Spacer/Aero-Holding Chambers (AEROCHAMBER MV) inhaler Use as instructed 11/12/19   Salena Saner, MD  sulfamethoxazole-trimethoprim (BACTRIM DS) 800-160 MG tablet Take 1 tablet by mouth 2 (two) times daily. 05/28/20   [provider]  triamcinolone cream (KENALOG) 0.1 % Apply 1 application topically 2 (two) times daily. Patient not taking: Reported on 06/04/2020    [provider]    Allergies Amoxicillin, Ceclor [cefaclor], Codeine, Erythromycin, Levaquin [levofloxacin in d5w], Macrodantin [nitrofurantoin macrocrystal], and Zithromax [azithromycin]  Family History  Problem Relation Age of Onset  . Breast cancer Sister 26        2 sisters  . Breast cancer Paternal Aunt     Social History Social History   Tobacco Use  . Smoking status: Never Smoker  . Smokeless tobacco: Never Used  Substance Use Topics  . Alcohol use: No  . Drug use: No    Review of Systems  Review of Systems  Constitutional: Positive for malaise/fatigue. Negative for chills and fever.  HENT: Negative for sore throat.   Eyes: Negative for pain.  Respiratory: Negative for cough and stridor.   Cardiovascular: Negative for chest pain.  Gastrointestinal: Negative for vomiting.  Genitourinary: Negative for dysuria.  Musculoskeletal: Negative for myalgias.  Skin: Negative for rash.  Neurological: Positive for weakness. Negative for seizures, loss of consciousness and headaches.  Psychiatric/Behavioral: Negative for suicidal ideas.  All other systems reviewed and are negative.      ____________________________________________   PHYSICAL EXAM:  VITAL SIGNS: ED Triage Vitals  Enc Vitals Group     BP 05/11/21 1238 (!) 142/77     Pulse Rate 05/11/21 1238 84     Resp 05/11/21 1238 18     Temp 05/11/21 1238 97.9 F (36.6 C)     Temp src --      SpO2 05/11/21 1238 96 %     Weight --      Height --      Head Circumference --      Peak Flow --      Pain Score 05/11/21 1235 0     Pain Loc --      Pain Edu? --      Excl. in GC? --    Vitals:   05/11/21 1238 05/11/21 1315  BP: (!) 142/77 127/60  Pulse: 84 81  Resp: 18   Temp: 97.9 F (36.6 C)   SpO2: 96% 98%   Physical Exam Vitals and nursing note reviewed.  Constitutional:      General: She is not in acute distress.  Appearance: She is well-developed.  HENT:     Head: Normocephalic and atraumatic.     Right Ear: External ear normal.     Left Ear: External ear normal.     Nose: Nose normal.  Eyes:     Conjunctiva/sclera: Conjunctivae normal.  Cardiovascular:     Rate and Rhythm: Normal rate and regular rhythm.     Heart sounds: No murmur heard.   Pulmonary:     Effort: Pulmonary effort is normal. No respiratory distress.     Breath sounds: Normal breath sounds.  Abdominal:     Palpations: Abdomen is soft.     Tenderness: There is no abdominal tenderness.  Musculoskeletal:     Cervical back: Neck supple.  Skin:    General: Skin is warm and dry.     Capillary Refill: Capillary refill takes less than 2 seconds.  Neurological:     Mental Status: She is alert and oriented to person, place, and time.  Psychiatric:        Mood and Affect: Mood normal.     Cranial nerves II through XII grossly intact.  Patient has full and symmetric strength in all extremities.  Sensation intact light touch to all extremities.  2+ bilateral radial and DP pulses.  EOMI.  PERRLA.  Patient has no slurred speech.  ____________________________________________   LABS (all labs ordered are listed, but only  abnormal results are displayed)  Labs Reviewed  BASIC METABOLIC PANEL - Abnormal; Notable for the following components:      Result Value   Calcium 8.6 (*)    All other components within normal limits  URINALYSIS, COMPLETE (UACMP) WITH MICROSCOPIC - Abnormal; Notable for the following components:   Color, Urine STRAW (*)    APPearance CLEAR (*)    Bacteria, UA RARE (*)    All other components within normal limits  D-DIMER, QUANTITATIVE - Abnormal; Notable for the following components:   D-Dimer, Quant 2.17 (*)    All other components within normal limits  CBC  MAGNESIUM  PROCALCITONIN  CBG MONITORING, ED  TROPONIN I (HIGH SENSITIVITY)   ____________________________________________  EKG  Sinus rhythm with ventricular rate of 86, normal axis, nonspecific ST changes in lead III without any other clear evidence of acute ischemia or significant underlying arrhythmia. ____________________________________________  RADIOLOGY  ED MD interpretation: Chest x-ray has no clear acute focal consolidation, effusion, overt edema, pneumothorax or any other clear acute intrathoracic process.  CTA obtained shows no evidence of PE or aortic aneurysm or dissection.  There is some aortic atherosclerosis and CAD.  There is also some basilar scarring atelectasis no evidence of acute heart failure or acute pneumonia.  Official radiology report(s): DG Chest 2 View  Result Date: 05/11/2021 CLINICAL DATA:  86 year old with weakness and shortness of breath. EXAM: CHEST - 2 VIEW COMPARISON:  11/12/2019 FINDINGS: Bronchiectasis or coarse lung markings in the right lower lobe are similar to the previous examination. Otherwise, the lungs are clear. Heart size is stable and within normal limits. No large pleural effusions. Negative for pneumothorax. No acute bone abnormality. IMPRESSION: No active cardiopulmonary disease. Electronically Signed   By: Richarda OverlieAdam  Henn M.D.   On: 05/11/2021 14:08   CT Angio Chest PE W  and/or Wo Contrast  Result Date: 05/11/2021 CLINICAL DATA:  Shortness of breath.  Recent COVID-19 positive EXAM: CT ANGIOGRAPHY CHEST WITH CONTRAST TECHNIQUE: Multidetector CT imaging of the chest was performed using the standard protocol during bolus administration of intravenous contrast. Multiplanar CT image  reconstructions and MIPs were obtained to evaluate the vascular anatomy. CONTRAST:  44mL OMNIPAQUE IOHEXOL 350 MG/ML SOLN COMPARISON:  Chest radiograph May 11, 2021; chest CT December 09, 2011 FINDINGS: Cardiovascular: There is no evident pulmonary embolus. No thoracic aortic aneurysm or dissection. Visualized great vessels appear unremarkable except for occasional foci of calcification. There are foci of aortic atherosclerosis. Scattered areas of coronary artery calcification noted. No pericardial effusion or pericardial thickening. Mediastinum/Nodes: Visualized thyroid appears normal. No appreciable thoracic adenopathy by size criteria. No evident adenopathy by size criteria. No esophageal lesions. Lungs/Pleura: There are areas of scarring and mild atelectatic change in the lung bases. No evident edema or airspace opacity. No pleural effusions. Trachea and major bronchial structures appear unremarkable. No evident pneumothorax. Stable right basilar pleural calcification. Upper Abdomen: Gallbladder absent. Incomplete visualization of a cyst arising from the upper pole the right kidney measuring 4.3 x 3.7 cm. Visualized upper abdominal structures otherwise appear unremarkable. Musculoskeletal: No blastic or lytic bone lesions. No chest wall lesions are evident. Review of the MIP images confirms the above findings. IMPRESSION: 1. No demonstrable pulmonary embolus. No thoracic aortic aneurysm or dissection. There are foci of aortic atherosclerosis as well as foci of coronary artery and great vessel calcification. 2. Areas of bibasilar scarring and atelectasis. No edema or airspace opacity. No pleural  effusions. Stable right basilar pleural calcification posteriorly. Question previous asbestos exposure. 3.  No adenopathy by size criteria. 4.  Gallbladder absent. Aortic Atherosclerosis (ICD10-I70.0). Electronically Signed   By: Bretta Bang III M.D.   On: 05/11/2021 14:30    ____________________________________________   PROCEDURES  Procedure(s) performed (including Critical Care):  .1-3 Lead EKG Interpretation Performed by: Gilles Chiquito, MD Authorized by: Gilles Chiquito, MD     Interpretation: normal     ECG rate assessment: normal     Rhythm: sinus rhythm     Ectopy: none     Conduction: normal       ____________________________________________   INITIAL IMPRESSION / ASSESSMENT AND PLAN / ED COURSE     Patient presents with above-stated history exam for assessment of weakness and some shortness of breath with exertion ongoing over the last couple weeks after recent diagnosis of COVID last month.  On arrival she is afebrile and hemodynamically stable.  Differential includes possible persistent symptoms from COVID, arrhythmia, ACS, PE, no acute infectious process, metabolic derangements and anemia.  No focal deficits to suggest CVA.  No history of recent trauma.  Very low suspicion for toxic ingestion.  Chest x-ray has no clear acute focal consolidation, effusion, overt edema, pneumothorax or any other clear acute intrathoracic process.  CTA obtained due to elevated D-dimer shows no evidence of PE or aortic aneurysm or dissection.  There is some aortic atherosclerosis and CAD.  There is also some basilar scarring atelectasis no evidence of acute heart failure or acute pneumonia.  BMP shows no significant electrode or metabolic derangements.  CBC shows no leukocytosis or acute anemia.  Troponin is nonelevated and given it was obtained greater than 3 hours after symptom onset overall I have a very low suspicion for ACS or myocarditis.  Magnesium is unremarkable.  UA is not  suggestive of infection.  Is certainly possible patient's shortness of breath and fatigue are related to recent COVID infection although given otherwise stable vitals and reassuring work-up with duration of symptoms of several weeks have low suspicion for immediately life-threatening pathology and think she is safe for discharge with continued outpatient evaluation.  Discharged  in stable condition.  Strict return cautions advised and discussed.       ____________________________________________   FINAL CLINICAL IMPRESSION(S) / ED DIAGNOSES  Final diagnoses:  SOB (shortness of breath)  Positive D dimer  Fatigue, unspecified type    Medications  iohexol (OMNIPAQUE) 350 MG/ML injection 75 mL (75 mLs Intravenous Contrast Given 05/11/21 1349)     ED Discharge Orders    None       Note:  This document was prepared using Dragon voice recognition software and may include unintentional dictation errors.   Gilles Chiquito, MD 05/11/21 [phone]

## 2021-05-31 ENCOUNTER — Ambulatory Visit (INDEPENDENT_AMBULATORY_CARE_PROVIDER_SITE_OTHER): Payer: Medicare Other | Admitting: Physician Assistant

## 2021-05-31 ENCOUNTER — Encounter: Payer: Self-pay | Admitting: Physician Assistant

## 2021-05-31 ENCOUNTER — Other Ambulatory Visit: Payer: Self-pay

## 2021-05-31 VITALS — BP 149/76 | HR 85

## 2021-05-31 DIAGNOSIS — R3915 Urgency of urination: Secondary | ICD-10-CM | POA: Diagnosis not present

## 2021-05-31 DIAGNOSIS — B372 Candidiasis of skin and nail: Secondary | ICD-10-CM | POA: Diagnosis not present

## 2021-05-31 LAB — BLADDER SCAN AMB NON-IMAGING: Scan Result: 169

## 2021-05-31 MED ORDER — KETOCONAZOLE 2 % EX CREA: 1.0000 "application " | TOPICAL_CREAM | Freq: Two times a day (BID) | CUTANEOUS | 1 refills | Status: DC

## 2021-05-31 NOTE — Progress Notes (Signed)
05/31/2021 2:08 PM   Rachel Harris May 23, 1935 [id number]  CC: Chief Complaint  Patient presents with  . Urinary Incontinence    HPI: Rachel Harris is a 86 y.o. 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   Today she reports bothersome urinary leakage with standing and hourly urinary frequency.  She uses 2-4 pads daily and these are damp when she changes them.  She also reports some mild dysuria, though 2 recent UAs were negative for infection.  She states overall her vaginal pressure and bulging has worsened recently.  Additionally, she reports an intermittent intertriginous rash that she attributes to her urinary leakage.  She has been using diaper cream and cornstarch with mixed success.  PVR 169 mL.  PMH: Past Medical History:  Diagnosis Date  . Barrett esophagus   . Cataract cortical, senile   . Degenerative disc disease, cervical   . Depression   . Diverticulosis   . Environmental allergies   . Exostosis   . Fibrocystic breast disease   . Fibrocystic breast disease   . Gastritis   . GERD (gastroesophageal reflux disease)   . Hallux valgus   . HH (hiatus hernia)   . History of degenerative disc disease   . History of shingles   . Hypercholesterolemia   . Hyperlipidemia   . Hypertension   . Internal hemorrhoids   . Menopausal syndrome   . Recurrent sinus infections   . Vaginitis     Surgical History: Past Surgical History:  Procedure Laterality Date  . ABDOMINAL HYSTERECTOMY    . APPENDECTOMY    . BREAST BIOPSY Left 2015   APOCRINE CYST  . BREAST CYST ASPIRATION Left 2015   neg  . BREAST SURGERY     biopsy  . CATARACT EXTRACTION W/ INTRAOCULAR LENS IMPLANTW/ TRABECULECTOMY    . CHOLECYSTECTOMY    . COLONOSCOPY WITH PROPOFOL N/A 08/25/2015   Procedure: COLONOSCOPY WITH PROPOFOL;  Surgeon: Christena Deem, MD;  Location:  [facility] ENDOSCOPY;  Service: Endoscopy;  Laterality: N/A;  . ESOPHAGOGASTRODUODENOSCOPY (EGD) WITH PROPOFOL N/A 08/25/2015   Procedure: ESOPHAGOGASTRODUODENOSCOPY (EGD) WITH PROPOFOL;  Surgeon: Christena Deem, MD;  Location: [facility] ENDOSCOPY;  Service: Endoscopy;  Laterality: N/A;  . ESOPHAGOGASTRODUODENOSCOPY (EGD) WITH PROPOFOL N/A 02/20/2018   Procedure: ESOPHAGOGASTRODUODENOSCOPY (EGD) WITH PROPOFOL;  Surgeon: Christena Deem, MD;  Location: [facility] ENDOSCOPY;  Service: Endoscopy;  Laterality: N/A;  . HERNIA REPAIR    . NISSEN FUNDOPLICATION    . TONSILLECTOMY      Home Medications:  Allergies as of 05/31/2021      Reactions   Amoxicillin    Ceclor [cefaclor]    Codeine    Erythromycin    Levaquin [levofloxacin In D5w]    Macrodantin [nitrofurantoin Macrocrystal]    Zithromax [azithromycin]       Medication List       Accurate as of May 31, 2021  2:08 PM. If you have any questions, ask your nurse or doctor.        STOP taking these medications   AeroChamber MV inhaler Stopped by: Carman Ching, PA-C   gentamicin ointment 0.1 % Commonly known as: GARAMYCIN Stopped by: Carman Ching, PA-C   sulfamethoxazole-trimethoprim 800-160 MG tablet Commonly known as: BACTRIM DS Stopped by: Carman Ching, PA-C     TAKE these medications   amitriptyline 25 MG tablet Commonly known as: ELAVIL Take  25 mg by mouth at bedtime.   ascorbic acid 1000 MG tablet Commonly known as: VITAMIN C Take by mouth.   aspirin EC 81 MG tablet Take 81 mg by mouth daily.   cefdinir 300 MG capsule Commonly known as: OMNICEF Take 300 mg by mouth 2 (two) times daily.   estradiol 1 MG tablet Commonly known as: ESTRACE Take 1 mg by mouth daily.   fluticasone 110 MCG/ACT inhaler Commonly known as: FLOVENT HFA Inhale 2 puffs into the lungs 2 (two) times daily.   multivitamin with minerals tablet Take 1 tablet by mouth daily.   omeprazole 20 MG capsule Commonly known as:  PRILOSEC Take 20 mg by mouth daily.   ondansetron 4 MG disintegrating tablet Commonly known as: Zofran ODT Take 1 tablet (4 mg total) by mouth every 8 (eight) hours as needed for nausea or vomiting.   triamcinolone cream 0.1 % Commonly known as: KENALOG Apply 1 application topically 2 (two) times daily.       Allergies:  Allergies  Allergen Reactions  . Amoxicillin   . Ceclor [Cefaclor]   . Codeine   . Erythromycin   . Levaquin [Levofloxacin In D5w]   . Macrodantin [Nitrofurantoin Macrocrystal]   . Zithromax [Azithromycin]     Family History: Family History  Problem Relation Age of Onset  . Breast cancer Sister 64        2 sisters  . Breast cancer Paternal Aunt     Social History:   reports that she has never smoked. She has never used smokeless tobacco. She reports that she does not drink alcohol and does not use drugs.  Physical Exam: BP (!) 149/76   Pulse 85   Constitutional:  Alert and oriented, no acute distress, nontoxic appearing HEENT: , AT Cardiovascular: No clubbing, cyanosis, or edema Respiratory: Normal respiratory effort, no increased work of breathing GU: Pink rash of the bilateral inguinal folds, satellite lesions present Skin: No rashes, bruises or suspicious lesions Neurologic: Grossly intact, no focal deficits, moving all 4 extremities Psychiatric: Normal mood and affect  Laboratory Data: Results for orders placed or performed in visit on 05/31/21  Bladder Scan (Post Void Residual) in office  Result Value Ref Range   Scan Result 169    Assessment & Plan:   1. Urinary urgency We discussed that this is likely due to her underlying pelvic organ prolapse and incomplete bladder emptying due to pinching off of the bladder neck.  I am hesitant to start her on OAB meds given that she is not emptying well.  We discussed management options at this time including reattempted pessary versus possible prolapse repair.  We also discussed pelvic floor  PT, though I explained this will not resolve her underlying prolapse.  She agreed to see Dr. Sherron Monday to discuss her surgical options.  In the meantime, I counseled her to manually reduce her cystocele while voiding. - Bladder Scan (Post Void Residual) in office  2. Intertriginous candidiasis Will treat with empiric ketoconazole cream. - ketoconazole (NIZORAL) 2 % cream; Apply 1 application topically 2 (two) times daily.  Dispense: 30 g; Refill: 1   Return in about 4 weeks (around 06/28/2021) for pelvic organ prolapse consult with Dr. Sherron Monday.  Carman Ching, PA-C  [facility] [address], [address] [place], [place] [address] [phone]

## 2021-05-31 NOTE — Patient Instructions (Signed)
Pelvic Organ Prolapse Pelvic organ prolapse is a condition in women that involves the stretching, bulging, or dropping of pelvic organs into an abnormal position, past the opening of the vagina. It happens when the muscles and tissues that surround and support pelvic structures become weak or stretched. Pelvic organ prolapse can involve the:  Vagina (vaginal prolapse).  Uterus (uterine prolapse).  Bladder (cystocele).  Rectum (rectocele).  Intestines (enterocele). When organs other than the vagina are involved, they often bulge into the vagina or protrude from the vagina, depending on how severe the prolapse is. What are the causes? This condition may be caused by:  Pregnancy, labor, and childbirth.  Past pelvic surgery.  Lower levels of the hormone estrogen due to menopause.  Consistently lifting more than 50 lb (23 kg).  Obesity.  Long-term difficulty passing stool (chronic constipation).  Long-term, or chronic, cough.  Fluid buildup in the abdomen due to certain conditions. What are the signs or symptoms? Symptoms of this condition include:  Leaking a little urine (loss of bladder control) when you cough, sneeze, strain, and exercise (stress incontinence). This may be worse immediately after childbirth. It may gradually improve over time.  Feeling pressure in your pelvis or vagina. This pressure may increase when you cough or when you are passing stool.  A bulge that protrudes from the opening of your vagina.  Difficulty passing urine or stool.  Pain in your lower back.  Pain or discomfort during sex, or decreased interest in sex.  Repeated bladder infections (urinary tract infections).  Difficulty inserting a tampon. In some people, this condition causes no symptoms. How is this diagnosed? This condition may be diagnosed based on a vaginal and rectal exam. During the exam, you may be asked to cough and strain while you are lying down, sitting, and standing up.  Your health care provider will determine if other tests are required, such as bladder function tests. How is this treated? Treatment for this condition may depend on your symptoms. Treatment may include:  Lifestyle changes, such as drinking plenty of fluids and eating foods that are high in fiber.  Emptying your bladder at scheduled times (bladder training therapy). This can help reduce or avoid urinary incontinence.  Estrogen. This may help mild prolapse by increasing the strength and tone of pelvic floor muscles.  Kegel exercises. These may help mild cases of prolapse by strengthening and tightening the muscles of the pelvic floor.  A soft, flexible device that helps support the vaginal walls and keep pelvic organs in place (pessary). This is inserted into your vagina by your health care provider.  Surgery. This is often the only form of treatment for severe prolapse. Follow these instructions at home: Eating and drinking  Avoid drinking beverages that contain caffeine or alcohol.  Increase your intake of high-fiber foods to decrease constipation and straining during bowel movements. Activity  Lose weight if recommended by your health care provider.  Avoid heavy lifting and straining with exercise and work. Do not hold your breath when you perform mild to moderate lifting and exercise activities. Limit your activities as directed by your health care provider.  Do Kegel exercises as directed by your health care provider. To do this: ? Squeeze your pelvic floor muscles tight. You should feel a tight lift in your rectal area and a tightness in your vaginal area. Keep your stomach, buttocks, and legs relaxed. ? Hold the muscles tight for up to 10 seconds. Then relax your muscles. ? Repeat this exercise   50 times a day, or as much as told by your health care provider. Continue to do this exercise for at least 4-6 weeks, or for as long as told by your health care provider. General  instructions  Take over-the-counter and prescription medicines only as told by your health care provider.  Wear a sanitary pad or adult diapers if you have urinary incontinence.  If you have a pessary, take care of it as told by your health care provider.  Keep all follow-up visits. This is important. Contact a health care provider if you:  Have symptoms that interfere with your daily activities or sex life.  Need medicine to help with the discomfort.  Notice bleeding from your vagina that is not related to your menstrual period.  Have a fever.  Have pain or bleeding when you urinate.  Have bleeding when you pass stool.  Pass urine when you have sex.  Have chronic constipation.  Have a pessary that falls out.  Have a foul-smelling vaginal discharge.  Have an unusual, low pain in your abdomen. Get help right away if you:  Cannot pass urine. Summary  Pelvic organ prolapse is the stretching, bulging, or dropping of pelvic organs into an abnormal position. It happens when the muscles and tissues that surround and support pelvic structures become weak or stretched.  When organs other than the vagina are involved, they often bulge into the vagina or protrude from it, depending on how severe the prolapse is.  In most cases, this condition needs to be treated only if it produces symptoms. Treatment may include lifestyle changes, estrogen, Kegel exercises, pessary insertion, or surgery.  Avoid heavy lifting and straining with exercise and work. Do not hold your breath when you perform mild to moderate lifting and exercise activities. Limit your activities as directed by your health care provider. This information is not intended to replace advice given to you by your health care provider. Make sure you discuss any questions you have with your health care provider. Document Revised: 06/08/2020 Document Reviewed: 06/08/2020 Elsevier Patient Education  2021 Elsevier Inc.  

## 2021-07-12 ENCOUNTER — Other Ambulatory Visit: Payer: Self-pay

## 2021-07-12 ENCOUNTER — Ambulatory Visit: Payer: Medicare Other | Admitting: Urology

## 2021-07-12 VITALS — BP 135/77 | HR 102

## 2021-07-12 DIAGNOSIS — R3915 Urgency of urination: Secondary | ICD-10-CM

## 2021-07-12 DIAGNOSIS — N3946 Mixed incontinence: Secondary | ICD-10-CM | POA: Diagnosis not present

## 2021-07-12 LAB — URINALYSIS, COMPLETE
Bilirubin, UA: NEGATIVE
Glucose, UA: NEGATIVE
Leukocytes,UA: NEGATIVE
Nitrite, UA: NEGATIVE
Specific Gravity, UA: 1.03 (ref 1.005–1.030)
Urobilinogen, Ur: 0.2 mg/dL (ref 0.2–1.0)
pH, UA: 5.5 (ref 5.0–7.5)

## 2021-07-12 LAB — BLADDER SCAN AMB NON-IMAGING: Scan Result: 31

## 2021-07-12 MED ORDER — SOLIFENACIN SUCCINATE 5 MG PO TABS: 5.0000 mg | ORAL_TABLET | Freq: Every day | ORAL | 11 refills | Status: DC

## 2021-07-12 MED ORDER — OXYBUTYNIN CHLORIDE ER 10 MG PO TB24: 10.0000 mg | ORAL_TABLET | Freq: Every day | ORAL | 11 refills | Status: DC

## 2021-07-12 NOTE — Progress Notes (Signed)
07/12/2021 3:34 PM   Rachel Harris [birth year] [id number]  Referring provider: Marguarite Arbour, MD [address] [facility] [place],  [place] [address]  Chief Complaint  Patient presents with   Urinary Urgency    HPI: I was consulted to assist the patient with prolapse who failed a pessary.  She has mixed incontinence.  Residual was 169 mL.  I think she has been having issues with urinary dermatitis and has been given ketoconazole cream.  Patient leaks sometimes with coughing sneezing bending lifting.  Sometimes she has urge incontinence.  No bedwetting but has foot on the floor syndrome.  Wears 4-5 pads a day sometimes damp sometimes soaked.  He leaks when she goes from sitting to standing position  She time voids every 1 hour.  She cannot hold it for 2 hours.  She gets up twice a night  Sometimes flow is good but generally slow.  She can strain and double void a small amount.  No prolapse symptoms  Has had a hysterectomy.  Bowel function normal  No history of kidney stones bladder surgery or bladder infections.  No current treatment.  No neurologic issues  Patient only could void a few drops but was scanned for less than 40 mL  On pelvic examination she had a significant posterior and apical defect.  She likely has a large rectocele and enterocele.  Vaginal cuff descended from 8 or 9 cm to approximately 4 cm.  It was hard to reduce posteriorly with a narrow speculum.  I used my hand as well.  It appeared when I reduce the apex posteriorly she may have leaked a drop and really had little cystocele.  Tissues were very redundant and voluminous.  I can see why she failed a pessary.  He did not have urinary dermatitis.   PMH: Past Medical History:  Diagnosis Date   Barrett esophagus    Cataract cortical, senile    Degenerative disc disease, cervical    Depression    Diverticulosis    Environmental allergies    Exostosis    Fibrocystic breast disease     Fibrocystic breast disease    Gastritis    GERD (gastroesophageal reflux disease)    Hallux valgus    HH (hiatus hernia)    History of degenerative disc disease    History of shingles    Hypercholesterolemia    Hyperlipidemia    Hypertension    Internal hemorrhoids    Menopausal syndrome    Recurrent sinus infections    Vaginitis     Surgical History: Past Surgical History:  Procedure Laterality Date   ABDOMINAL HYSTERECTOMY     APPENDECTOMY     BREAST BIOPSY Left 2015   APOCRINE CYST   BREAST CYST ASPIRATION Left 2015   neg   BREAST SURGERY     biopsy   CATARACT EXTRACTION W/ INTRAOCULAR LENS IMPLANTW/ TRABECULECTOMY     CHOLECYSTECTOMY     COLONOSCOPY WITH PROPOFOL N/A 08/25/2015   Procedure: COLONOSCOPY WITH PROPOFOL;  Surgeon: Christena Deem, MD;  Location: [facility] ENDOSCOPY;  Service: Endoscopy;  Laterality: N/A;   ESOPHAGOGASTRODUODENOSCOPY (EGD) WITH PROPOFOL N/A 08/25/2015   Procedure: ESOPHAGOGASTRODUODENOSCOPY (EGD) WITH PROPOFOL;  Surgeon: Christena Deem, MD;  Location: [facility] ENDOSCOPY;  Service: Endoscopy;  Laterality: N/A;   ESOPHAGOGASTRODUODENOSCOPY (EGD) WITH PROPOFOL N/A 02/20/2018   Procedure: ESOPHAGOGASTRODUODENOSCOPY (EGD) WITH PROPOFOL;  Surgeon: Christena Deem, MD;  Location: [facility] ENDOSCOPY;  Service: Endoscopy;  Laterality: N/A;   HERNIA  REPAIR     NISSEN FUNDOPLICATION     TONSILLECTOMY      Home Medications:  Allergies as of 07/12/2021       Reactions   Amoxicillin    Ceclor [cefaclor]    Codeine    Erythromycin    Levaquin [levofloxacin In D5w]    Macrodantin [nitrofurantoin Macrocrystal]    Zithromax [azithromycin]         Medication List        Accurate as of July 12, 2021  3:34 PM. If you have any questions, ask your nurse or doctor.          STOP taking these medications    amitriptyline 25 MG tablet Commonly known as: ELAVIL Stopped by: Martina Sinner, MD   cefdinir 300 MG capsule Commonly known as:  OMNICEF Stopped by: Martina Sinner, MD   fluticasone 110 MCG/ACT inhaler Commonly known as: FLOVENT HFA Stopped by: Jonna Coup Soul Hackman, MD   ondansetron 4 MG disintegrating tablet Commonly known as: Zofran ODT Stopped by: Martina Sinner, MD       TAKE these medications    amLODipine 2.5 MG tablet Commonly known as: NORVASC Take 2.5 mg by mouth daily. What changed: Another medication with the same name was removed. Continue taking this medication, and follow the directions you see here. Changed by: Martina Sinner, MD   ascorbic acid 1000 MG tablet Commonly known as: VITAMIN C Take by mouth.   aspirin EC 81 MG tablet Take 81 mg by mouth daily.   estradiol 1 MG tablet Commonly known as: ESTRACE Take 1 mg by mouth daily.   ketoconazole 2 % cream Commonly known as: NIZORAL Apply 1 application topically 2 (two) times daily.   multivitamin with minerals tablet Take 1 tablet by mouth daily.   omeprazole 20 MG capsule Commonly known as: PRILOSEC Take 20 mg by mouth daily.   triamcinolone cream 0.1 % Commonly known as: KENALOG Apply 1 application topically 2 (two) times daily.        Allergies:  Allergies  Allergen Reactions   Amoxicillin    Ceclor [Cefaclor]    Codeine    Erythromycin    Levaquin [Levofloxacin In D5w]    Macrodantin [Nitrofurantoin Macrocrystal]    Zithromax [Azithromycin]     Family History: Family History  Problem Relation Age of Onset   Breast cancer Sister 40        2 sisters   Breast cancer Paternal Aunt     Social History:  reports that she has never smoked. She has never used smokeless tobacco. She reports that she does not drink alcohol and does not use drugs.  ROS:                                        Physical Exam: BP 135/77   Pulse (!) 102   Constitutional:  Alert and oriented, No acute distress.   Laboratory Data: Lab Results  Component Value Date   WBC 5.9 05/11/2021   HGB  13.3 05/11/2021   HCT 38.5 05/11/2021   MCV 93.4 05/11/2021   PLT 256 05/11/2021    Lab Results  Component Value Date   CREATININE 0.71 05/11/2021    No results found for: PSA  No results found for: TESTOSTERONE  No results found for: HGBA1C  Urinalysis    Component Value Date/Time   COLORURINE STRAW (  A) 05/11/2021 1431   APPEARANCEUR CLEAR (A) 05/11/2021 1431   APPEARANCEUR Clear 01/29/2020 1432   LABSPEC 1.016 05/11/2021 1431   PHURINE 7.0 05/11/2021 1431   GLUCOSEU NEGATIVE 05/11/2021 1431   HGBUR NEGATIVE 05/11/2021 1431   BILIRUBINUR NEGATIVE 05/11/2021 1431   BILIRUBINUR Negative 01/29/2020 1432   KETONESUR NEGATIVE 05/11/2021 1431   PROTEINUR NEGATIVE 05/11/2021 1431   NITRITE NEGATIVE 05/11/2021 1431   LEUKOCYTESUR NEGATIVE 05/11/2021 1431    Pertinent Imaging:   Assessment & Plan: Patient has mixed incontinence.  She has frequency and nocturia.  She has some flow symptoms.  Picture was drawn.  Prolapse is asymptomatic.  We will try to manage her mixed incontinence with medical and behavioral therapy.  If this fails we can always order urodynamics.  By history she primarily has an overactive bladder.  I do not think she ever tried Myrbetriq because she thought it was going to cost a lot but did not know the cost.  I gave her Oxy IR 10 mg of Vesicare 5 mg and see back in 8 weeks.  We can always try beta 3 agonist with Myrbetriq etc.  Picture was drawn.  Watchful waiting for prolapse unrelated  There are no diagnoses linked to this encounter.  No follow-ups on file.  Martina Sinner, MD  [facility] [address], [address] [place], [place] [address] [phone]

## 2021-08-16 ENCOUNTER — Ambulatory Visit: Payer: Self-pay | Admitting: Urology

## 2021-09-06 ENCOUNTER — Encounter: Payer: Self-pay | Admitting: Urology

## 2021-09-06 ENCOUNTER — Other Ambulatory Visit: Payer: Self-pay

## 2021-09-06 ENCOUNTER — Ambulatory Visit: Payer: Medicare Other | Admitting: Urology

## 2021-09-06 VITALS — BP 158/80 | HR 73 | Ht 66.0 in | Wt 188.0 lb

## 2021-09-06 DIAGNOSIS — N3946 Mixed incontinence: Secondary | ICD-10-CM

## 2021-09-06 MED ORDER — TOLTERODINE TARTRATE ER 4 MG PO CP24: 4.0000 mg | ORAL_CAPSULE | Freq: Every day | ORAL | 11 refills | Status: DC

## 2021-09-06 NOTE — Progress Notes (Signed)
09/06/2021 1:56 PM   Rachel Harris 1935/11/12 [id number]  Referring provider: Marguarite Arbour, MD [address] [facility] [place],  [place] [address]  Chief Complaint  Patient presents with   Follow-up    HPI: Rachel Harris: Vaginal vault prolapse with stress incontinence.  Residual 169 mL.  Failed a pessary.  The patient has vaginal bulging.  She does not reduce it.  She has had a hysterectomy with bladder suspension in the past.  She has normal bowel movements.  She sometimes leaks with coughing sneezing bending lifting.  She has urge incontinence.  She primarily leaks when she goes from a sitting to standing position.  No bedwetting.  She wears 3 pads a day sometimes damp sometimes soaked.  She voids every 2 hours gets up once or twice a night.  Her flow is sometimes good sometimes poor.  She is not very bothered by the prolapse.  On pelvic examination patient had a long vagina.  She had a very impressive posterior defect.  Even with a wider speculum the sidewalls would fall in with some redundancy of tissue.  The vaginal cuff descended from 8 or 9 cm to approximately 6 cm.  With the cuff reduced she had minimal to no cystocele.  She likely has an enterocele as well as a rectocele.     PMH: Past Medical History:  Diagnosis Date   Barrett esophagus    Cataract cortical, senile    Degenerative disc disease, cervical    Depression    Diverticulosis    Environmental allergies    Exostosis    Fibrocystic breast disease    Fibrocystic breast disease    Gastritis    GERD (gastroesophageal reflux disease)    Hallux valgus    HH (hiatus hernia)    History of degenerative disc disease    History of shingles    Hypercholesterolemia    Hyperlipidemia    Hypertension    Internal hemorrhoids    Menopausal syndrome    Recurrent sinus infections    Vaginitis     Surgical History: Past Surgical History:  Procedure Laterality Date   ABDOMINAL HYSTERECTOMY      APPENDECTOMY     BREAST BIOPSY Left 2015   APOCRINE CYST   BREAST CYST ASPIRATION Left 2015   neg   BREAST SURGERY     biopsy   CATARACT EXTRACTION W/ INTRAOCULAR LENS IMPLANTW/ TRABECULECTOMY     CHOLECYSTECTOMY     COLONOSCOPY WITH PROPOFOL N/A 08/25/2015   Procedure: COLONOSCOPY WITH PROPOFOL;  Surgeon: Christena Deem, MD;  Location: [facility] ENDOSCOPY;  Service: Endoscopy;  Laterality: N/A;   ESOPHAGOGASTRODUODENOSCOPY (EGD) WITH PROPOFOL N/A 08/25/2015   Procedure: ESOPHAGOGASTRODUODENOSCOPY (EGD) WITH PROPOFOL;  Surgeon: Christena Deem, MD;  Location: [facility] ENDOSCOPY;  Service: Endoscopy;  Laterality: N/A;   ESOPHAGOGASTRODUODENOSCOPY (EGD) WITH PROPOFOL N/A 02/20/2018   Procedure: ESOPHAGOGASTRODUODENOSCOPY (EGD) WITH PROPOFOL;  Surgeon: Christena Deem, MD;  Location: [facility] ENDOSCOPY;  Service: Endoscopy;  Laterality: N/A;   HERNIA REPAIR     NISSEN FUNDOPLICATION     TONSILLECTOMY      Home Medications:  Allergies as of 09/06/2021       Reactions   Amoxicillin    Ceclor [cefaclor]    Codeine    Erythromycin    Levaquin [levofloxacin In D5w]    Macrodantin [nitrofurantoin Macrocrystal]    Zithromax [azithromycin]    Sulfa Antibiotics Rash        Medication List  Accurate as of September 06, 2021  1:56 PM. If you have any questions, ask your nurse or doctor.          STOP taking these medications    oxybutynin 10 MG 24 hr tablet Commonly known as: DITROPAN-XL Stopped by: Martina Sinner, MD   solifenacin 5 MG tablet Commonly known as: VESICARE Stopped by: Martina Sinner, MD   triamcinolone cream 0.1 % Commonly known as: KENALOG Stopped by: Martina Sinner, MD       TAKE these medications    amLODipine 2.5 MG tablet Commonly known as: NORVASC Take 2.5 mg by mouth daily.   ascorbic acid 1000 MG tablet Commonly known as: VITAMIN C Take by mouth.   aspirin EC 81 MG tablet Take 81 mg by mouth daily.   estradiol 1 MG  tablet Commonly known as: ESTRACE Take 1 mg by mouth daily.   ketoconazole 2 % cream Commonly known as: NIZORAL Apply 1 application topically 2 (two) times daily.   multivitamin with minerals tablet Take 1 tablet by mouth daily.   omeprazole 20 MG capsule Commonly known as: PRILOSEC Take 20 mg by mouth daily.        Allergies:  Allergies  Allergen Reactions   Amoxicillin    Ceclor [Cefaclor]    Codeine    Erythromycin    Levaquin [Levofloxacin In D5w]    Macrodantin [Nitrofurantoin Macrocrystal]    Zithromax [Azithromycin]    Sulfa Antibiotics Rash    Family History: Family History  Problem Relation Age of Onset   Breast cancer Sister 30        2 sisters   Breast cancer Paternal Aunt     Social History:  reports that she has never smoked. She has never used smokeless tobacco. She reports that she does not drink alcohol and does not use drugs.  ROS:                                        Physical Exam: BP (!) 158/80   Pulse 73   Ht 5\' 6"  (1.676 m)   Wt 85.3 kg   BMI 30.34 kg/m   Constitutional:  Alert and oriented, No acute distress. HEENT: [place] AT, moist mucus membranes.  Trachea midline, no masses. Cardiovascular: No clubbing, cyanosis, or edema.   Laboratory Data: Lab Results  Component Value Date   WBC 5.9 05/11/2021   HGB 13.3 05/11/2021   HCT 38.5 05/11/2021   MCV 93.4 05/11/2021   PLT 256 05/11/2021    Lab Results  Component Value Date   CREATININE 0.71 05/11/2021    No results found for: PSA  No results found for: TESTOSTERONE  No results found for: HGBA1C  Urinalysis    Component Value Date/Time   COLORURINE STRAW (A) 05/11/2021 1431   APPEARANCEUR Hazy (A) 07/12/2021 1556   LABSPEC 1.016 05/11/2021 1431   PHURINE 7.0 05/11/2021 1431   GLUCOSEU Negative 07/12/2021 1556   HGBUR NEGATIVE 05/11/2021 1431   BILIRUBINUR Negative 07/12/2021 1556   KETONESUR NEGATIVE 05/11/2021 1431   PROTEINUR 1+ (A)  07/12/2021 1556   PROTEINUR NEGATIVE 05/11/2021 1431   NITRITE Negative 07/12/2021 1556   NITRITE NEGATIVE 05/11/2021 1431   LEUKOCYTESUR Negative 07/12/2021 1556   LEUKOCYTESUR NEGATIVE 05/11/2021 1431    Pertinent Imaging:   Assessment & Plan: Picture was drawn.  If she had surgery I would do video  urodynamics to assess her bladder function and look for a cystocele in the sitting position.  The redundancy of tissue was quite impressive and certainly if there is small bowel quite far down a robotic procedure would likely be best.  I think would be more challenging to repair it vaginally with a graft depending on the intraoperative findings.  Watchful waiting especially with mild symptoms discussed.  By history the patient is failed Vesicare and oxybutynin.  She want to try Detrol.  Prescription sent.  See in 6 weeks.  Our goal is to help her urge incontinence and frequency and watchful waiting for prolapse  There are no diagnoses linked to this encounter.  No follow-ups on file.  Martina Sinner, MD  [facility] [address], [address] [place], [place] [address] [phone]

## 2021-09-15 ENCOUNTER — Telehealth: Payer: Self-pay

## 2021-09-15 NOTE — Telephone Encounter (Signed)
Patient having increasing urinary leakage and bladder pressure with taking Detrol 4 mg. I advised pt to DC medications. Is there something else we can try?

## 2021-09-15 NOTE — Telephone Encounter (Signed)
Sw pt will give samples of gemtesa. Pt aware.

## 2021-09-20 ENCOUNTER — Other Ambulatory Visit: Payer: Self-pay

## 2021-09-20 ENCOUNTER — Emergency Department
Admission: EM | Admit: 2021-09-20 | Discharge: 2021-09-20 | Disposition: A | Discharge status: Home / Self Care | Payer: Medicare Other | Attending: Emergency Medicine | Admitting: Emergency Medicine

## 2021-09-20 ENCOUNTER — Encounter: Payer: Self-pay | Admitting: Emergency Medicine

## 2021-09-20 ENCOUNTER — Emergency Department: Payer: Medicare Other

## 2021-09-20 DIAGNOSIS — R0789 Other chest pain: Secondary | ICD-10-CM | POA: Diagnosis not present

## 2021-09-20 DIAGNOSIS — R609 Edema, unspecified: Secondary | ICD-10-CM | POA: Diagnosis not present

## 2021-09-20 DIAGNOSIS — R5383 Other fatigue: Secondary | ICD-10-CM | POA: Insufficient documentation

## 2021-09-20 DIAGNOSIS — Z7982 Long term (current) use of aspirin: Secondary | ICD-10-CM | POA: Diagnosis not present

## 2021-09-20 DIAGNOSIS — K219 Gastro-esophageal reflux disease without esophagitis: Secondary | ICD-10-CM | POA: Diagnosis not present

## 2021-09-20 DIAGNOSIS — Z20822 Contact with and (suspected) exposure to covid-19: Secondary | ICD-10-CM | POA: Insufficient documentation

## 2021-09-20 LAB — BASIC METABOLIC PANEL
Anion gap: 6 (ref 5–15)
BUN: 10 mg/dL (ref 8–23)
CO2: 27 mmol/L (ref 22–32)
Calcium: 8.8 mg/dL — ABNORMAL LOW (ref 8.9–10.3)
Chloride: 97 mmol/L — ABNORMAL LOW (ref 98–111)
Creatinine, Ser: 0.73 mg/dL (ref 0.44–1.00)
GFR, Estimated: >60 mL/min (ref >60)
Glucose, Bld: 96 mg/dL (ref 70–99)
Potassium: 3.9 mmol/L (ref 3.5–5.1)
Sodium: 130 mmol/L — ABNORMAL LOW (ref 135–145)

## 2021-09-20 LAB — URINALYSIS, COMPLETE (UACMP) WITH MICROSCOPIC
Bilirubin Urine: NEGATIVE
Glucose, UA: NEGATIVE mg/dL
Hgb urine dipstick: NEGATIVE
Ketones, ur: NEGATIVE mg/dL
Leukocytes,Ua: NEGATIVE
Nitrite: NEGATIVE
Protein, ur: NEGATIVE mg/dL
Specific Gravity, Urine: 1.009 (ref 1.005–1.030)
pH: 6 (ref 5.0–8.0)

## 2021-09-20 LAB — CBC
HCT: 41.2 % (ref 36.0–46.0)
Hemoglobin: 14.1 g/dL (ref 12.0–15.0)
MCH: 32.3 pg (ref 26.0–34.0)
MCHC: 34.2 g/dL (ref 30.0–36.0)
MCV: 94.3 fL (ref 80.0–100.0)
Platelets: 234 10*3/uL (ref 150–400)
RBC: 4.37 MIL/uL (ref 3.87–5.11)
RDW: 12.5 % (ref 11.5–15.5)
WBC: 5.6 10*3/uL (ref 4.0–10.5)
nRBC: 0 % (ref 0.0–0.2)

## 2021-09-20 LAB — RESP PANEL BY RT-PCR (FLU A&B, COVID) ARPGX2
Influenza A by PCR: NEGATIVE
Influenza B by PCR: NEGATIVE
SARS Coronavirus 2 by RT PCR: NEGATIVE

## 2021-09-20 LAB — TROPONIN I (HIGH SENSITIVITY): Troponin I (High Sensitivity): 3 ng/L (ref <18)

## 2021-09-20 NOTE — Discharge Instructions (Signed)
Your blood work, urine sample, EKG, chest x-ray were all reassuring today.  You do not have a UTI.  Your heart biomarkers were normal indicating that you did not have a heart attack.  Your COVID test was negative.  We are not sure exactly what is contributing to your general weakness.  If you have worsening chest pain or new symptoms that are concerning to you like nausea or shortness of breath, please return to the emergency department.  Otherwise please follow-up with your primary care provider in several days for reevaluation.

## 2021-09-20 NOTE — ED Provider Notes (Signed)
[facility]  ____________________________________________   Event Date/Time   First MD Initiated Contact with Patient 09/20/21 1201     (approximate)  I have reviewed the triage vital signs and the nursing notes.   HISTORY  Chief Complaint Chest Pain    HPI Rachel Harris is a 86 y.o. female past medical history of GERD, depression, hyperlipidemia who presents with generalized fatigue.  Patient notes that she had her flu shot on Thursday she was also started on an antibiotic for right arm cellulitis as well as a medication for urinary incontinence by the urologist.  Shortly after she began having intermittent weak spells.  She describes feeling like she has to sit down or she may pass out.  There is no specific chest pain, dyspnea or palpitations associated with these episodes.  She does endorse some generalized mild chest pressure that is worse when she is trying to sleep.  It is not exertional.  She denies cough, fevers or chills.  No abdominal pain.  She is continued to go about her normal daily activities.  Denies urinary symptoms.  Past Medical History:  Diagnosis Date   Barrett esophagus    Cataract cortical, senile    Degenerative disc disease, cervical    Depression    Diverticulosis    Environmental allergies    Exostosis    Fibrocystic breast disease    Fibrocystic breast disease    Gastritis    GERD (gastroesophageal reflux disease)    Hallux valgus    HH (hiatus hernia)    History of degenerative disc disease    History of shingles    Hypercholesterolemia    Hyperlipidemia    Hypertension    Internal hemorrhoids    Menopausal syndrome    Recurrent sinus infections    Vaginitis     There are no problems to display for this patient.   Past Surgical History:  Procedure Laterality Date   ABDOMINAL HYSTERECTOMY     APPENDECTOMY     BREAST BIOPSY Left 2015   APOCRINE CYST   BREAST CYST ASPIRATION Left 2015   neg   BREAST  SURGERY     biopsy   CATARACT EXTRACTION W/ INTRAOCULAR LENS IMPLANTW/ TRABECULECTOMY     CHOLECYSTECTOMY     COLONOSCOPY WITH PROPOFOL N/A 08/25/2015   Procedure: COLONOSCOPY WITH PROPOFOL;  Surgeon: Christena Deem, MD;  Location: [facility] ENDOSCOPY;  Service: Endoscopy;  Laterality: N/A;   ESOPHAGOGASTRODUODENOSCOPY (EGD) WITH PROPOFOL N/A 08/25/2015   Procedure: ESOPHAGOGASTRODUODENOSCOPY (EGD) WITH PROPOFOL;  Surgeon: Christena Deem, MD;  Location: [facility] ENDOSCOPY;  Service: Endoscopy;  Laterality: N/A;   ESOPHAGOGASTRODUODENOSCOPY (EGD) WITH PROPOFOL N/A 02/20/2018   Procedure: ESOPHAGOGASTRODUODENOSCOPY (EGD) WITH PROPOFOL;  Surgeon: Christena Deem, MD;  Location: [facility] ENDOSCOPY;  Service: Endoscopy;  Laterality: N/A;   HERNIA REPAIR     NISSEN FUNDOPLICATION     TONSILLECTOMY      Prior to Admission medications   Medication Sig Start Date End Date Taking? Authorizing Provider  amLODipine (NORVASC) 2.5 MG tablet Take 2.5 mg by mouth daily.    [provider]  ascorbic acid (VITAMIN C) 1000 MG tablet Take by mouth.    [provider]  aspirin EC 81 MG tablet Take 81 mg by mouth daily.    [provider]  estradiol (ESTRACE) 1 MG tablet Take 1 mg by mouth daily.    [provider]  ketoconazole (NIZORAL) 2 % cream Apply 1 application topically 2 (two) times  daily. 05/31/21   Carman Ching, PA-C  Multiple Vitamins-Minerals (MULTIVITAMIN WITH MINERALS) tablet Take 1 tablet by mouth daily.    [provider]  omeprazole (PRILOSEC) 20 MG capsule Take 20 mg by mouth daily.    [provider]  tolterodine (DETROL LA) 4 MG 24 hr capsule Take 1 capsule (4 mg total) by mouth daily. 09/06/21   Alfredo Martinez, MD    Allergies Amoxicillin, Ceclor [cefaclor], Codeine, Erythromycin, Levaquin [levofloxacin in d5w], Macrodantin [nitrofurantoin macrocrystal], Zithromax [azithromycin], and Sulfa antibiotics  Family History  Problem  Relation Age of Onset   Breast cancer Sister 85        2 sisters   Breast cancer Paternal Aunt     Social History Social History   Tobacco Use   Smoking status: Never   Smokeless tobacco: Never  Substance Use Topics   Alcohol use: No   Drug use: No    Review of Systems   Review of Systems  Constitutional:  Positive for fatigue. Negative for activity change, appetite change, diaphoresis and fever.  Respiratory:  Positive for chest tightness. Negative for shortness of breath.   Cardiovascular:  Positive for chest pain. Negative for palpitations and leg swelling.  Gastrointestinal:  Negative for abdominal pain, nausea and vomiting.  Genitourinary:  Negative for dysuria.  Neurological:  Negative for headaches.  All other systems reviewed and are negative.  Physical Exam Updated Vital Signs BP (!) 148/70 (BP Location: Left Arm)   Pulse 65   Temp 98.5 F (36.9 C) (Oral)   Resp 18   Ht 5\' 6"  (1.676 m)   Wt 85.7 kg   SpO2 97%   BMI 30.51 kg/m   Physical Exam Vitals and nursing note reviewed.  Constitutional:      General: She is not in acute distress.    Appearance: Normal appearance.  HENT:     Head: Normocephalic and atraumatic.  Eyes:     General: No scleral icterus.    Conjunctiva/sclera: Conjunctivae normal.  Neck:     Comments: Moist mucous membranes Cardiovascular:     Rate and Rhythm: Normal rate and regular rhythm.     Heart sounds: Normal heart sounds.  Pulmonary:     Effort: Pulmonary effort is normal. No respiratory distress.     Breath sounds: No stridor.  Abdominal:     Palpations: Abdomen is soft. There is no mass.     Tenderness: There is no abdominal tenderness. There is no guarding.  Musculoskeletal:        General: No deformity or signs of injury.     Cervical back: Normal range of motion.     Comments: Mild bilateral lower extremity edema  Skin:    General: Skin is warm and dry.     Coloration: Skin is not jaundiced or pale.   Neurological:     General: No focal deficit present.     Mental Status: She is alert and oriented to person, place, and time. Mental status is at baseline.  Psychiatric:        Mood and Affect: Mood normal.        Behavior: Behavior normal.     LABS (all labs ordered are listed, but only abnormal results are displayed)  Labs Reviewed  BASIC METABOLIC PANEL - Abnormal; Notable for the following components:      Result Value   Sodium 130 (*)    Chloride 97 (*)    Calcium 8.8 (*)    All other components  within normal limits  URINALYSIS, COMPLETE (UACMP) WITH MICROSCOPIC - Abnormal; Notable for the following components:   Color, Urine YELLOW (*)    APPearance CLEAR (*)    Bacteria, UA RARE (*)    All other components within normal limits  RESP PANEL BY RT-PCR (FLU A&B, COVID) ARPGX2  CBC  TROPONIN I (HIGH SENSITIVITY)   ____________________________________________  EKG  NSR, nml axis, nml intervals, no acute ischemic changes  ____________________________________________  RADIOLOGY Ky Barban, personally viewed and evaluated these images (plain radiographs) as part of my medical decision making, as well as reviewing the written report by the radiologist.  ED MD interpretation:  I reviewed the CXR which does not show any acute cardiopulmonary process      ____________________________________________   PROCEDURES  Procedure(s) performed (including Critical Care):  Procedures   ____________________________________________   INITIAL IMPRESSION / ASSESSMENT AND PLAN / ED COURSE     86 year old female presents with intermittent lightheadedness and chest comfort.  Symptoms started several days ago after getting the flu shot and being started on new medication.  Vital signs within normal limits.  She is very well-appearing.  Her chest x-ray does not show any infiltrate, EKG is nonischemic and troponin is negative.  Her chest pain does not sound anginal and  that it is worse with sleeping and lying down, not exertional and not associated with dyspnea or other concerning symptoms.  Low suspicion for pulmonary embolism with her vital signs and lack of risk factors.  Her lightheadedness does not correlate with her chest pain.  UA does not show infection.  And her COVID testing is negative.  Unclear what exactly the cause of her symptoms is.  I advised that she follow-up with her primary care provider.  If worsening chest pain or other new concerning symptoms, return to the emergency department.   Clinical Course as of 09/20/21 1618  Mon Sep 20, 2021  1259 Bacteria, UA(!): RARE [KM]    Clinical Course User Index [KM] Georga Hacking, MD     ____________________________________________   FINAL CLINICAL IMPRESSION(S) / ED DIAGNOSES  Final diagnoses:  Other fatigue     ED Discharge Orders     None        Note:  This document was prepared using Dragon voice recognition software and may include unintentional dictation errors.    Georga Hacking, MD 09/20/21 [phone]

## 2021-09-20 NOTE — ED Triage Notes (Signed)
Pt reports that she got her flu shot and started a new medication last Thursday, after that she developed chest tightness. Denies any N/V, diaphoresis, SHOB or radiation. She states the tightness is hindering her from sleep.

## 2021-10-04 ENCOUNTER — Other Ambulatory Visit: Payer: Self-pay | Admitting: Internal Medicine

## 2021-10-04 DIAGNOSIS — Z1231 Encounter for screening mammogram for malignant neoplasm of breast: Secondary | ICD-10-CM

## 2021-10-12 ENCOUNTER — Other Ambulatory Visit: Payer: Self-pay

## 2021-10-12 ENCOUNTER — Ambulatory Visit
Admission: RE | Admit: 2021-10-12 | Discharge: 2021-10-12 | Disposition: A | Discharge status: Home / Self Care | Payer: Medicare Other | Source: Ambulatory Visit | Attending: Internal Medicine | Admitting: Internal Medicine

## 2021-10-12 DIAGNOSIS — Z1231 Encounter for screening mammogram for malignant neoplasm of breast: Secondary | ICD-10-CM | POA: Diagnosis not present

## 2021-10-18 ENCOUNTER — Other Ambulatory Visit: Payer: Self-pay

## 2021-10-18 ENCOUNTER — Encounter: Payer: Self-pay | Admitting: Urology

## 2021-10-18 ENCOUNTER — Ambulatory Visit: Payer: Medicare Other | Admitting: Urology

## 2021-10-18 VITALS — BP 154/77 | HR 53 | Ht 66.0 in | Wt 189.0 lb

## 2021-10-18 DIAGNOSIS — N3946 Mixed incontinence: Secondary | ICD-10-CM

## 2021-10-18 MED ORDER — VIBEGRON 75 MG PO TABS: 75.0000 mg | ORAL_TABLET | Freq: Every day | ORAL | 11 refills | Status: DC

## 2021-10-18 NOTE — Progress Notes (Signed)
10/18/2021 10:50 AM   [name] [birth year] [id number]  Referring provider: Marguarite Arbour, MD [address] [facility] [place],  [place] [address]  Chief Complaint  Patient presents with   Follow-up    HPI: Apolinar Junes: Vaginal vault prolapse with stress incontinence.  Residual 169 mL.  Failed a pessary.   The patient has vaginal bulging.  She does not reduce it.  She has had a hysterectomy with bladder suspension in the past.  She has normal bowel movements.  She sometimes leaks with coughing sneezing bending lifting.  She has urge incontinence.  She primarily leaks when she goes from a sitting to standing position.  No bedwetting.  She wears 3 pads a day sometimes damp sometimes soaked.  She voids every 2 hours gets up once or twice a night.  Her flow is sometimes good sometimes poor.   She is not very bothered by the prolapse.   On pelvic examination patient had a long vagina.  She had a very impressive posterior defect.  Even with a wider speculum the sidewalls would fall in with some redundancy of tissue.  The vaginal cuff descended from 8 or 9 cm to approximately 6 cm.  With the cuff reduced she had minimal to no cystocele.  She likely has an enterocele as well as a rectocele.    Picture was drawn.  If she had surgery I would do video urodynamics to assess her bladder function and look for a cystocele in the sitting position.  The redundancy of tissue was quite impressive and certainly if there is small bowel quite far down a robotic procedure would likely be best.  I think would be more challenging to repair it vaginally with a graft depending on the intraoperative findings.  Watchful waiting especially with mild symptoms discussed.   By history the patient is failed Vesicare and oxybutynin.  She want to try Detrol.  Prescription sent.  See in 6 weeks.  Our goal is to help her urge incontinence and frequency and watchful waiting for prolapse  Today Frequency  stable.  Patient failed Detrol.  She is completely dry on Gemtesa.  She has failed Myrbetriq and oxybutynin and Vesicare as well.  Quality life significantly better.  She no longer has high-volume leakage.  She should not try another antimuscarinic due to cognitive risks and failure of her 3 medications.  Samples and prescription sent   PMH: Past Medical History:  Diagnosis Date   Barrett esophagus    Cataract cortical, senile    Degenerative disc disease, cervical    Depression    Diverticulosis    Environmental allergies    Exostosis    Fibrocystic breast disease    Fibrocystic breast disease    Gastritis    GERD (gastroesophageal reflux disease)    Hallux valgus    HH (hiatus hernia)    History of degenerative disc disease    History of shingles    Hypercholesterolemia    Hyperlipidemia    Hypertension    Internal hemorrhoids    Menopausal syndrome    Recurrent sinus infections    Vaginitis     Surgical History: Past Surgical History:  Procedure Laterality Date   ABDOMINAL HYSTERECTOMY     APPENDECTOMY     BREAST BIOPSY Left 2015   APOCRINE CYST   BREAST CYST ASPIRATION Left 2015   neg   BREAST SURGERY     biopsy   CATARACT EXTRACTION W/ INTRAOCULAR LENS IMPLANTW/ TRABECULECTOMY  CHOLECYSTECTOMY     COLONOSCOPY WITH PROPOFOL N/A 08/25/2015   Procedure: COLONOSCOPY WITH PROPOFOL;  Surgeon: Christena Deem, MD;  Location: [facility] ENDOSCOPY;  Service: Endoscopy;  Laterality: N/A;   ESOPHAGOGASTRODUODENOSCOPY (EGD) WITH PROPOFOL N/A 08/25/2015   Procedure: ESOPHAGOGASTRODUODENOSCOPY (EGD) WITH PROPOFOL;  Surgeon: Christena Deem, MD;  Location: [facility] ENDOSCOPY;  Service: Endoscopy;  Laterality: N/A;   ESOPHAGOGASTRODUODENOSCOPY (EGD) WITH PROPOFOL N/A 02/20/2018   Procedure: ESOPHAGOGASTRODUODENOSCOPY (EGD) WITH PROPOFOL;  Surgeon: Christena Deem, MD;  Location: [facility] ENDOSCOPY;  Service: Endoscopy;  Laterality: N/A;   HERNIA REPAIR     NISSEN FUNDOPLICATION      TONSILLECTOMY      Home Medications:  Allergies as of 10/18/2021       Reactions   Amoxicillin    Ceclor [cefaclor]    Codeine    Erythromycin    Levaquin [levofloxacin In D5w]    Macrodantin [nitrofurantoin Macrocrystal]    Zithromax [azithromycin]    Sulfa Antibiotics Rash        Medication List        Accurate as of October 18, 2021 10:50 AM. If you have any questions, ask your nurse or doctor.          amLODipine 2.5 MG tablet Commonly known as: NORVASC Take 2.5 mg by mouth daily.   ascorbic acid 1000 MG tablet Commonly known as: VITAMIN C Take by mouth.   aspirin EC 81 MG tablet Take 81 mg by mouth daily.   estradiol 1 MG tablet Commonly known as: ESTRACE Take 1 mg by mouth daily.   ketoconazole 2 % cream Commonly known as: NIZORAL Apply 1 application topically 2 (two) times daily.   multivitamin with minerals tablet Take 1 tablet by mouth daily.   omeprazole 20 MG capsule Commonly known as: PRILOSEC Take 20 mg by mouth daily.   tolterodine 4 MG 24 hr capsule Commonly known as: Detrol LA Take 1 capsule (4 mg total) by mouth daily.        Allergies:  Allergies  Allergen Reactions   Amoxicillin    Ceclor [Cefaclor]    Codeine    Erythromycin    Levaquin [Levofloxacin In D5w]    Macrodantin [Nitrofurantoin Macrocrystal]    Zithromax [Azithromycin]    Sulfa Antibiotics Rash    Family History: Family History  Problem Relation Age of Onset   Breast cancer Sister 25        2 sisters   Breast cancer Paternal Aunt     Social History:  reports that she has never smoked. She has never used smokeless tobacco. She reports that she does not drink alcohol and does not use drugs.  ROS:                                        Physical Exam: BP (!) 154/77   Pulse (!) 53   Ht 5\' 6"  (1.676 m)   Wt 85.7 kg   BMI 30.51 kg/m   Constitutional:  Alert and oriented, No acute distress. HEENT: [place] AT, moist mucus  membranes.  Trachea midline, no masses.   Laboratory Data: Lab Results  Component Value Date   WBC 5.6 09/20/2021   HGB 14.1 09/20/2021   HCT 41.2 09/20/2021   MCV 94.3 09/20/2021   PLT 234 09/20/2021    Lab Results  Component Value Date   CREATININE 0.73 09/20/2021    No  results found for: PSA  No results found for: TESTOSTERONE  No results found for: HGBA1C  Urinalysis    Component Value Date/Time   COLORURINE YELLOW (A) 09/20/2021 1237   APPEARANCEUR CLEAR (A) 09/20/2021 1237   APPEARANCEUR Hazy (A) 07/12/2021 1556   LABSPEC 1.009 09/20/2021 1237   PHURINE 6.0 09/20/2021 1237   GLUCOSEU NEGATIVE 09/20/2021 1237   HGBUR NEGATIVE 09/20/2021 1237   BILIRUBINUR NEGATIVE 09/20/2021 1237   BILIRUBINUR Negative 07/12/2021 1556   KETONESUR NEGATIVE 09/20/2021 1237   PROTEINUR NEGATIVE 09/20/2021 1237   NITRITE NEGATIVE 09/20/2021 1237   LEUKOCYTESUR NEGATIVE 09/20/2021 1237    Pertinent Imaging:   Assessment & Plan: Reassess in 4 months  There are no diagnoses linked to this encounter.  No follow-ups on file.  Martina Sinner, MD  [facility] [address], [address] [place], [place] [address] [phone]

## 2021-10-21 ENCOUNTER — Telehealth: Payer: Self-pay | Admitting: Urology

## 2021-10-21 NOTE — Telephone Encounter (Signed)
Rachel Harris came in to speak with someone about the medication Vibegron. She said when she came in Monday, whoever was assisting Dr. Sherron Monday told her that if that medication was too expensive then to come back to speak with her and one of you would put it in in some kind of way to make it cheaper. Pt would like a call back. I'm just adding the both of you because I don't know who she spoke with.

## 2021-10-21 NOTE — Telephone Encounter (Signed)
PA was approved for [employer]. Advised pt call [facility] to see if it is cheaper at [facility]. Pt verbalized understanding. And will try to reach [facility] if she has any trouble I advised her to call me back so I can try and call.

## 2021-12-15 ENCOUNTER — Other Ambulatory Visit: Payer: Self-pay | Admitting: Internal Medicine

## 2021-12-15 ENCOUNTER — Ambulatory Visit
Admission: RE | Admit: 2021-12-15 | Discharge: 2021-12-15 | Disposition: A | Discharge status: Home / Self Care | Payer: Medicare Other | Source: Ambulatory Visit | Attending: Internal Medicine | Admitting: Internal Medicine

## 2021-12-15 DIAGNOSIS — M7989 Other specified soft tissue disorders: Secondary | ICD-10-CM | POA: Insufficient documentation

## 2021-12-23 ENCOUNTER — Ambulatory Visit: Payer: Medicare Other

## 2022-02-21 ENCOUNTER — Encounter: Payer: Self-pay | Admitting: Urology

## 2022-02-21 ENCOUNTER — Ambulatory Visit: Payer: Medicare Other | Admitting: Urology

## 2022-02-21 ENCOUNTER — Other Ambulatory Visit: Payer: Self-pay

## 2022-02-21 VITALS — BP 131/76 | HR 84 | Ht 66.0 in | Wt 189.0 lb

## 2022-02-21 DIAGNOSIS — N3946 Mixed incontinence: Secondary | ICD-10-CM | POA: Diagnosis not present

## 2022-02-21 MED ORDER — VIBEGRON 75 MG PO TABS: 75.0000 mg | ORAL_TABLET | Freq: Every day | ORAL | 11 refills | Status: DC

## 2022-02-21 NOTE — Progress Notes (Signed)
02/21/2022 10:03 AM   [name] August 20, 1935 [id number]  Referring provider: Marguarite Arbour, MD [address] [facility] [place],  [place] [address]  Chief Complaint  Patient presents with   Follow-up    HPI: Rachel Harris: Vaginal vault prolapse with stress incontinence.  Residual 169 mL.  Failed a pessary.   The patient has vaginal bulging.  She does not reduce it.  She has had a hysterectomy with bladder suspension in the past.  She has normal bowel movements.  She sometimes leaks with coughing sneezing bending lifting.  She has urge incontinence.  She primarily leaks when she goes from a sitting to standing position.  No bedwetting.  She wears 3 pads a day sometimes damp sometimes soaked.  She voids every 2 hours gets up once or twice a night.  Her flow is sometimes good sometimes poor.   She is not very bothered by the prolapse.   On pelvic examination patient had a long vagina.  She had a very impressive posterior defect.  Even with a wider speculum the sidewalls would fall in with some redundancy of tissue.  The vaginal cuff descended from 8 or 9 cm to approximately 6 cm.  With the cuff reduced she had minimal to no cystocele.  She likely has an enterocele as well as a rectocele.     Picture was drawn.  If she had surgery I would do video urodynamics to assess her bladder function and look for a cystocele in the sitting position.  The redundancy of tissue was quite impressive and certainly if there is small bowel quite far down a robotic procedure would likely be best.  I think would be more challenging to repair it vaginally with a graft depending on the intraoperative findings.  Watchful waiting especially with mild symptoms discussed.   By history the patient is failed Vesicare and oxybutynin.  She want to try Detrol.  Prescription sent.  See in 6 weeks.  Our goal is to help her urge incontinence and frequency and watchful waiting for prolapse    Today Frequency stable.  Patient failed Detrol.  She is completely dry on Gemtesa.  She has failed Myrbetriq and oxybutynin and Vesicare as well.  Quality life significantly better.  She no longer has high-volume leakage.  She should not try another antimuscarinic due to cognitive risks and failure of her 3 medications.  Samples and prescription sent    Today Urgency incontinence dramatically better.  Frequency stable.  Clinically no infection.  Prolapse stable   PMH: Past Medical History:  Diagnosis Date   Barrett esophagus    Cataract cortical, senile    Degenerative disc disease, cervical    Depression    Diverticulosis    Environmental allergies    Exostosis    Fibrocystic breast disease    Fibrocystic breast disease    Gastritis    GERD (gastroesophageal reflux disease)    Hallux valgus    HH (hiatus hernia)    History of degenerative disc disease    History of shingles    Hypercholesterolemia    Hyperlipidemia    Hypertension    Internal hemorrhoids    Menopausal syndrome    Recurrent sinus infections    Vaginitis     Surgical History: Past Surgical History:  Procedure Laterality Date   ABDOMINAL HYSTERECTOMY     APPENDECTOMY     BREAST BIOPSY Left 2015   APOCRINE CYST   BREAST CYST ASPIRATION Left 2015  neg   BREAST SURGERY     biopsy   CATARACT EXTRACTION W/ INTRAOCULAR LENS IMPLANTW/ TRABECULECTOMY     CHOLECYSTECTOMY     COLONOSCOPY WITH PROPOFOL N/A 08/25/2015   Procedure: COLONOSCOPY WITH PROPOFOL;  Surgeon: Lollie Sails, MD;  Location: [facility] ENDOSCOPY;  Service: Endoscopy;  Laterality: N/A;   ESOPHAGOGASTRODUODENOSCOPY (EGD) WITH PROPOFOL N/A 08/25/2015   Procedure: ESOPHAGOGASTRODUODENOSCOPY (EGD) WITH PROPOFOL;  Surgeon: Lollie Sails, MD;  Location: [facility] ENDOSCOPY;  Service: Endoscopy;  Laterality: N/A;   ESOPHAGOGASTRODUODENOSCOPY (EGD) WITH PROPOFOL N/A 02/20/2018   Procedure: ESOPHAGOGASTRODUODENOSCOPY (EGD) WITH PROPOFOL;  Surgeon:  Lollie Sails, MD;  Location: [facility] ENDOSCOPY;  Service: Endoscopy;  Laterality: N/A;   HERNIA REPAIR     NISSEN FUNDOPLICATION     TONSILLECTOMY      Home Medications:  Allergies as of 02/21/2022       Reactions   Amoxicillin    Ceclor [cefaclor]    Codeine    Erythromycin    Levaquin [levofloxacin In D5w]    Macrodantin [nitrofurantoin Macrocrystal]    Zithromax [azithromycin]    Sulfa Antibiotics Rash        Medication List        Accurate as of February 21, 2022 10:03 AM. If you have any questions, ask your nurse or doctor.          amLODipine 2.5 MG tablet Commonly known as: NORVASC Take 2.5 mg by mouth daily.   ascorbic acid 1000 MG tablet Commonly known as: VITAMIN C Take by mouth.   aspirin EC 81 MG tablet Take 81 mg by mouth daily.   estradiol 1 MG tablet Commonly known as: ESTRACE Take 1 mg by mouth daily.   ketoconazole 2 % cream Commonly known as: NIZORAL Apply 1 application topically 2 (two) times daily.   multivitamin with minerals tablet Take 1 tablet by mouth daily.   omeprazole 20 MG capsule Commonly known as: PRILOSEC Take 20 mg by mouth daily.   Vibegron 75 MG Tabs Take 75 mg by mouth daily.        Allergies:  Allergies  Allergen Reactions   Amoxicillin    Ceclor [Cefaclor]    Codeine    Erythromycin    Levaquin [Levofloxacin In D5w]    Macrodantin [Nitrofurantoin Macrocrystal]    Zithromax [Azithromycin]    Sulfa Antibiotics Rash    Family History: Family History  Problem Relation Age of Onset   Breast cancer Sister 40        2 sisters   Breast cancer Paternal Aunt     Social History:  reports that she has never smoked. She has never used smokeless tobacco. She reports that she does not drink alcohol and does not use drugs.  ROS:                                        Physical Exam: BP 131/76    Pulse 84    Ht 5\' 6"  (1.676 m)    Wt 85.7 kg    BMI 30.51 kg/m   Constitutional:   Alert and oriented, No acute distress. HEENT:  AT, moist mucus membranes.  Trachea midline, no masses.  Laboratory Data: Lab Results  Component Value Date   WBC 5.6 09/20/2021   HGB 14.1 09/20/2021   HCT 41.2 09/20/2021   MCV 94.3 09/20/2021   PLT 234 09/20/2021    Lab  Results  Component Value Date   CREATININE 0.73 09/20/2021    No results found for: PSA  No results found for: TESTOSTERONE  No results found for: HGBA1C  Urinalysis    Component Value Date/Time   COLORURINE YELLOW (A) 09/20/2021 1237   APPEARANCEUR CLEAR (A) 09/20/2021 1237   APPEARANCEUR Hazy (A) 07/12/2021 1556   LABSPEC 1.009 09/20/2021 1237   PHURINE 6.0 09/20/2021 [place] 09/20/2021 1237   HGBUR NEGATIVE 09/20/2021 1237   BILIRUBINUR NEGATIVE 09/20/2021 1237   BILIRUBINUR Negative 07/12/2021 1556   KETONESUR NEGATIVE 09/20/2021 1237   PROTEINUR NEGATIVE 09/20/2021 1237   NITRITE NEGATIVE 09/20/2021 1237   LEUKOCYTESUR NEGATIVE 09/20/2021 1237    Pertinent Imaging:   Assessment & Plan: Prescription renewed.  Samples given.  I will see in 1 year  There are no diagnoses linked to this encounter.  No follow-ups on file.  Reece Packer, MD  [facility] [address] [place], [place] [address] [phone]

## 2022-07-11 ENCOUNTER — Encounter: Payer: Self-pay | Admitting: Urology

## 2022-07-11 ENCOUNTER — Ambulatory Visit: Payer: Medicare Other | Admitting: Urology

## 2022-07-11 VITALS — BP 151/73 | HR 75 | Ht 66.0 in | Wt 191.0 lb

## 2022-07-11 DIAGNOSIS — N3946 Mixed incontinence: Secondary | ICD-10-CM | POA: Diagnosis not present

## 2022-07-11 NOTE — Progress Notes (Signed)
07/11/2022 1:46 PM   Rachel Harris 1935/06/15 [id number]  Referring provider: Marguarite Arbour, MD [address] [facility] [place],  [place] [address]  Chief Complaint  Patient presents with   Urinary Incontinence    Follow up    HPI: Brandon: Vaginal vault prolapse with stress incontinence.  Residual 169 mL.  Failed a pessary.   The patient has vaginal bulging.  She does not reduce it.  She has had a hysterectomy with bladder suspension in the past.  She has normal bowel movements.  She sometimes leaks with coughing sneezing bending lifting.  She has urge incontinence.  She primarily leaks when she goes from a sitting to standing position.  No bedwetting.  She wears 3 pads a day sometimes damp sometimes soaked.  She voids every 2 hours gets up once or twice a night.  Her flow is sometimes good sometimes poor.   She is not very bothered by the prolapse.   On pelvic examination patient had a long vagina.  She had a very impressive posterior defect.  Even with a wider speculum the sidewalls would fall in with some redundancy of tissue.  The vaginal cuff descended from 8 or 9 cm to approximately 6 cm.  With the cuff reduced she had minimal to no cystocele.  She likely has an enterocele as well as a rectocele.     Picture was drawn.  If she had surgery I would do video urodynamics to assess her bladder function and look for a cystocele in the sitting position.  The redundancy of tissue was quite impressive and certainly if there is small bowel quite far down a robotic procedure would likely be best.  I think would be more challenging to repair it vaginally with a graft depending on the intraoperative findings.  Watchful waiting especially with mild symptoms discussed.   By history the patient is failed Vesicare and oxybutynin.  She want to try Detrol.  Prescription sent.  See in 6 weeks.  Our goal is to help her urge incontinence and frequency and watchful waiting for  prolapse   Today Frequency stable.  Patient failed Detrol.  She is completely dry on Gemtesa.  She has failed Myrbetriq and oxybutynin and Vesicare as well.  Quality life significantly better.  She no longer has high-volume leakage.  She should not try another antimuscarinic due to cognitive risks and failure of her 3 medications.  Samples and prescription sent     Today Urgency incontinence dramatically better.  Frequency stable.  Clinically no infection.  Prolapse stable    TOday Leslye Peer was working very well but she thinks it caused leg edema that went away when she stopped it.  She does see a cardiologist on Friday.  She said it was significant and bilateral.  Frequency and urge incontinence stable   PMH: Past Medical History:  Diagnosis Date   Barrett esophagus    Cataract cortical, senile    Degenerative disc disease, cervical    Depression    Diverticulosis    Environmental allergies    Exostosis    Fibrocystic breast disease    Fibrocystic breast disease    Gastritis    GERD (gastroesophageal reflux disease)    Hallux valgus    HH (hiatus hernia)    History of degenerative disc disease    History of shingles    Hypercholesterolemia    Hyperlipidemia    Hypertension    Internal hemorrhoids    Menopausal syndrome  Recurrent sinus infections    Vaginitis     Surgical History: Past Surgical History:  Procedure Laterality Date   ABDOMINAL HYSTERECTOMY     APPENDECTOMY     BREAST BIOPSY Left 2015   APOCRINE CYST   BREAST CYST ASPIRATION Left 2015   neg   BREAST SURGERY     biopsy   CATARACT EXTRACTION W/ INTRAOCULAR LENS IMPLANTW/ TRABECULECTOMY     CHOLECYSTECTOMY     COLONOSCOPY WITH PROPOFOL N/A 08/25/2015   Procedure: COLONOSCOPY WITH PROPOFOL;  Surgeon: Lollie Sails, MD;  Location: [facility] ENDOSCOPY;  Service: Endoscopy;  Laterality: N/A;   ESOPHAGOGASTRODUODENOSCOPY (EGD) WITH PROPOFOL N/A 08/25/2015   Procedure: ESOPHAGOGASTRODUODENOSCOPY (EGD) WITH  PROPOFOL;  Surgeon: Lollie Sails, MD;  Location: [facility] ENDOSCOPY;  Service: Endoscopy;  Laterality: N/A;   ESOPHAGOGASTRODUODENOSCOPY (EGD) WITH PROPOFOL N/A 02/20/2018   Procedure: ESOPHAGOGASTRODUODENOSCOPY (EGD) WITH PROPOFOL;  Surgeon: Lollie Sails, MD;  Location: [facility] ENDOSCOPY;  Service: Endoscopy;  Laterality: N/A;   HERNIA REPAIR     NISSEN FUNDOPLICATION     TONSILLECTOMY      Home Medications:  Allergies as of 07/11/2022       Reactions   Amoxicillin    Ceclor [cefaclor]    Codeine    Erythromycin    Levaquin [levofloxacin In D5w]    Macrodantin [nitrofurantoin Macrocrystal]    Zithromax [azithromycin]    Sulfa Antibiotics Rash        Medication List        Accurate as of July 11, 2022  1:46 PM. If you have any questions, ask your nurse or doctor.          amLODipine 2.5 MG tablet Commonly known as: NORVASC Take 2.5 mg by mouth daily.   ascorbic acid 1000 MG tablet Commonly known as: VITAMIN C Take by mouth.   aspirin EC 81 MG tablet Take 81 mg by mouth daily.   estradiol 1 MG tablet Commonly known as: ESTRACE Take 1 mg by mouth daily.   ketoconazole 2 % cream Commonly known as: NIZORAL Apply 1 application topically 2 (two) times daily.   multivitamin with minerals tablet Take 1 tablet by mouth daily.   omeprazole 20 MG capsule Commonly known as: PRILOSEC Take 20 mg by mouth daily.   Vibegron 75 MG Tabs Take 75 mg by mouth daily.        Allergies:  Allergies  Allergen Reactions   Amoxicillin    Ceclor [Cefaclor]    Codeine    Erythromycin    Levaquin [Levofloxacin In D5w]    Macrodantin [Nitrofurantoin Macrocrystal]    Zithromax [Azithromycin]    Sulfa Antibiotics Rash    Family History: Family History  Problem Relation Age of Onset   Breast cancer Sister 61        2 sisters   Breast cancer Paternal Aunt     Social History:  reports that she has never smoked. She has never used smokeless tobacco. She reports  that she does not drink alcohol and does not use drugs.  ROS:                                        Physical Exam: BP (!) 151/73   Pulse 75   Ht 5\' 6"  (1.676 m)   Wt 86.6 kg   BMI 30.83 kg/m   Constitutional:  Alert and oriented, No acute distress.  Laboratory Data: Lab Results  Component Value Date   WBC 5.6 09/20/2021   HGB 14.1 09/20/2021   HCT 41.2 09/20/2021   MCV 94.3 09/20/2021   PLT 234 09/20/2021    Lab Results  Component Value Date   CREATININE 0.73 09/20/2021    No results found for: "PSA"  No results found for: "TESTOSTERONE"  No results found for: "HGBA1C"  Urinalysis    Component Value Date/Time   COLORURINE YELLOW (A) 09/20/2021 1237   APPEARANCEUR CLEAR (A) 09/20/2021 1237   APPEARANCEUR Hazy (A) 07/12/2021 1556   LABSPEC 1.009 09/20/2021 1237   PHURINE 6.0 09/20/2021 1237   GLUCOSEU NEGATIVE 09/20/2021 1237   HGBUR NEGATIVE 09/20/2021 1237   BILIRUBINUR NEGATIVE 09/20/2021 1237   BILIRUBINUR Negative 07/12/2021 1556   KETONESUR NEGATIVE 09/20/2021 1237   PROTEINUR NEGATIVE 09/20/2021 1237   NITRITE NEGATIVE 09/20/2021 1237   LEUKOCYTESUR NEGATIVE 09/20/2021 1237    Pertinent Imaging:   Assessment & Plan: Recognizing I certainly do not want to cause any harm but recognizing other options I suggested that she try some more samples at full dose.  Call us in the next week if it continues.  We agreed that she will see her cardiologist on Friday.  She will then try free samples and call if she starts slowly getting ankle edema  There are no diagnoses linked to this encounter.  No follow-ups on file.  Martina Sinner, MD  [facility] [address], [address] [place], [place] [address] [phone]

## 2022-07-11 NOTE — Patient Instructions (Signed)
Call with Leslye Peer results

## 2022-08-16 ENCOUNTER — Other Ambulatory Visit: Payer: Self-pay | Admitting: Family Medicine

## 2022-08-16 DIAGNOSIS — M5416 Radiculopathy, lumbar region: Secondary | ICD-10-CM

## 2022-08-23 ENCOUNTER — Telehealth: Payer: Self-pay | Admitting: Physician Assistant

## 2022-08-23 ENCOUNTER — Other Ambulatory Visit: Payer: Self-pay | Admitting: Physician Assistant

## 2022-08-23 DIAGNOSIS — B372 Candidiasis of skin and nail: Secondary | ICD-10-CM

## 2022-08-23 MED ORDER — KETOCONAZOLE 2 % EX CREA: 1.0000 | TOPICAL_CREAM | Freq: Two times a day (BID) | CUTANEOUS | 1 refills | Status: DC

## 2022-08-23 NOTE — Telephone Encounter (Signed)
Done, please notify patient

## 2022-08-23 NOTE — Telephone Encounter (Signed)
Patient requesting refill for Ketoconazole 2% cream. Pharmacy is [facility] in [place].

## 2022-08-24 NOTE — Telephone Encounter (Signed)
Spoke with patient and advised results   

## 2022-08-31 ENCOUNTER — Ambulatory Visit: Payer: Medicare Other | Admitting: Physician Assistant

## 2022-08-31 VITALS — BP 146/76 | HR 76 | Temp 98.1°F | Ht 66.0 in | Wt 192.6 lb

## 2022-08-31 DIAGNOSIS — R3915 Urgency of urination: Secondary | ICD-10-CM | POA: Diagnosis not present

## 2022-08-31 DIAGNOSIS — N3000 Acute cystitis without hematuria: Secondary | ICD-10-CM | POA: Diagnosis not present

## 2022-08-31 LAB — URINALYSIS, COMPLETE
Bilirubin, UA: NEGATIVE
Glucose, UA: NEGATIVE
Ketones, UA: NEGATIVE
Leukocytes,UA: NEGATIVE
Nitrite, UA: NEGATIVE
Protein,UA: NEGATIVE
RBC, UA: NEGATIVE
Specific Gravity, UA: 1.01 (ref 1.005–1.030)
Urobilinogen, Ur: 0.2 mg/dL (ref 0.2–1.0)
pH, UA: 6.5 (ref 5.0–7.5)

## 2022-08-31 LAB — MICROSCOPIC EXAMINATION: Epithelial Cells (non renal): >10 /hpf — AB (ref 0–10)

## 2022-08-31 LAB — BLADDER SCAN AMB NON-IMAGING

## 2022-08-31 NOTE — Progress Notes (Signed)
08/31/2022 2:25 PM   [name] 1935/09/21 [id number]  CC: Chief Complaint  Patient presents with   Urinary Tract Infection    Pt is started having  back and urine freq on 08/20/22   HPI: Rachel Harris is a 87 y.o. female with PMH vaginal vault prolapse who failed a pessary, stress incontinence, and OAB wet who previously failed Vesicare, oxybutynin, Detrol, and Myrbetriq who presents today for UTI follow-up.  She was seen by her PCP on 08/17/2022 with reports of 3 days of dysuria, increased urge/frequency, and suprapubic pressure.  UA was notable for pyuria.  She was started on empiric doxycycline and once transitioned to cefdinir when her urine culture finalized with oxacillin, penicillin, and tetracycline resistant staph hemolyticus.  Today she reports her dysuria has resolved.  She had diarrhea and nausea on cefdinir so she stopped taking it twice daily as prescribed.  She has been taking it once daily at most since.  With regard to her OAB wet, she feels that [place] did not help her enough with her symptoms to justify the cost of the medication.  She is no longer taking it.  Overall she reports minimal bother due to her OAB and does not wish to pursue additional therapy at this time.  In-office UA today pan negative; urine microscopy with >10 epithelial cells/hpf and moderate bacteria. PVR 57mL.  PMH: Past Medical History:  Diagnosis Date   Barrett esophagus    Cataract cortical, senile    Degenerative disc disease, cervical    Depression    Diverticulosis    Environmental allergies    Exostosis    Fibrocystic breast disease    Fibrocystic breast disease    Gastritis    GERD (gastroesophageal reflux disease)    Hallux valgus    HH (hiatus hernia)    History of degenerative disc disease    History of shingles    Hypercholesterolemia    Hyperlipidemia    Hypertension    Internal hemorrhoids    Menopausal syndrome    Recurrent sinus infections    Vaginitis      Surgical History: Past Surgical History:  Procedure Laterality Date   ABDOMINAL HYSTERECTOMY     APPENDECTOMY     BREAST BIOPSY Left 2015   APOCRINE CYST   BREAST CYST ASPIRATION Left 2015   neg   BREAST SURGERY     biopsy   CATARACT EXTRACTION W/ INTRAOCULAR LENS IMPLANTW/ TRABECULECTOMY     CHOLECYSTECTOMY     COLONOSCOPY WITH PROPOFOL N/A 08/25/2015   Procedure: COLONOSCOPY WITH PROPOFOL;  Surgeon: Christena Deem, MD;  Location: [facility] ENDOSCOPY;  Service: Endoscopy;  Laterality: N/A;   ESOPHAGOGASTRODUODENOSCOPY (EGD) WITH PROPOFOL N/A 08/25/2015   Procedure: ESOPHAGOGASTRODUODENOSCOPY (EGD) WITH PROPOFOL;  Surgeon: Christena Deem, MD;  Location: [facility] ENDOSCOPY;  Service: Endoscopy;  Laterality: N/A;   ESOPHAGOGASTRODUODENOSCOPY (EGD) WITH PROPOFOL N/A 02/20/2018   Procedure: ESOPHAGOGASTRODUODENOSCOPY (EGD) WITH PROPOFOL;  Surgeon: Christena Deem, MD;  Location: [facility] ENDOSCOPY;  Service: Endoscopy;  Laterality: N/A;   HERNIA REPAIR     NISSEN FUNDOPLICATION     TONSILLECTOMY      Home Medications:  Allergies as of 08/31/2022       Reactions   Amoxicillin    Ceclor [cefaclor]    Codeine    Erythromycin    Levaquin [levofloxacin In D5w]    Macrodantin [nitrofurantoin Macrocrystal]    Zithromax [azithromycin]    Sulfa Antibiotics Rash  Medication List        Accurate as of August 31, 2022  2:25 PM. If you have any questions, ask your nurse or doctor.          STOP taking these medications    aspirin EC 81 MG tablet       TAKE these medications    amLODipine 2.5 MG tablet Commonly known as: NORVASC Take 2.5 mg by mouth daily.   ascorbic acid 1000 MG tablet Commonly known as: VITAMIN C Take by mouth.   cefdinir 300 MG capsule Commonly known as: OMNICEF Take by mouth.   doxycycline 100 MG capsule Commonly known as: VIBRAMYCIN Take 100 mg by mouth 2 (two) times daily.   estradiol 1 MG tablet Commonly known as: ESTRACE Take 1  mg by mouth daily. What changed: Another medication with the same name was removed. Continue taking this medication, and follow the directions you see here.   ketoconazole 2 % cream Commonly known as: NIZORAL Apply 1 Application topically 2 (two) times daily.   multivitamin with minerals tablet Take 1 tablet by mouth daily.   omeprazole 20 MG capsule Commonly known as: PRILOSEC Take 20 mg by mouth daily. What changed: Another medication with the same name was removed. Continue taking this medication, and follow the directions you see here.   Vibegron 75 MG Tabs Take 75 mg by mouth daily.        Allergies:  Allergies  Allergen Reactions   Amoxicillin    Ceclor [Cefaclor]    Codeine    Erythromycin    Levaquin [Levofloxacin In D5w]    Macrodantin [Nitrofurantoin Macrocrystal]    Zithromax [Azithromycin]    Sulfa Antibiotics Rash    Family History: Family History  Problem Relation Age of Onset   Breast cancer Sister 75        2 sisters   Breast cancer Paternal Aunt     Social History:   reports that she has never smoked. She has never used smokeless tobacco. She reports that she does not drink alcohol and does not use drugs.  Physical Exam: BP (!) 146/76   Pulse 76   Temp 98.1 F (36.7 C)   Ht 5\' 6"  (1.676 m)   Wt 192 lb 9.6 oz (87.4 kg)   BMI 31.09 kg/m   Constitutional:  Alert and oriented, no acute distress, nontoxic appearing HEENT: , AT Cardiovascular: No clubbing, cyanosis, or edema Respiratory: Normal respiratory effort, no increased work of breathing Skin: No rashes, bruises or suspicious lesions Neurologic: Grossly intact, no focal deficits, moving all 4 extremities Psychiatric: Normal mood and affect  Laboratory Data: Results for orders placed or performed in visit on 08/31/22  Microscopic Examination   Urine  Result Value Ref Range   WBC, UA 0-5 0 - 5 /hpf   RBC, Urine 0-2 0 - 2 /hpf   Epithelial Cells (non renal) >10 (A) 0 - 10 /hpf    Bacteria, UA Moderate (A) None seen/Few  Urinalysis, Complete  Result Value Ref Range   Specific Gravity, UA 1.010 1.005 - 1.030   pH, UA 6.5 5.0 - 7.5   Color, UA Yellow Yellow   Appearance Ur Cloudy (A) Clear   Leukocytes,UA Negative Negative   Protein,UA Negative Negative/Trace   Glucose, UA Negative Negative   Ketones, UA Negative Negative   RBC, UA Negative Negative   Bilirubin, UA Negative Negative   Urobilinogen, Ur 0.2 0.2 - 1.0 mg/dL   Nitrite, UA Negative Negative  Microscopic Examination See below:   BLADDER SCAN AMB NON-IMAGING  Result Value Ref Range   Scan Result 4ml    Assessment & Plan:   1. Acute cystitis without hematuria UA today is bland, though appears contaminated.  Her symptoms have resolved on culture appropriate cefdinir, though it does not sound like she took an appropriate regimen of this.  Counseled her to return to clinic if her symptoms return but okay to stop cefdinir for now given poor tolerance of this medication. - Urinalysis, Complete  2. Urinary urgency Off Gemtesa with minimal symptom bother.  She wishes to defer additional therapies, which is reasonable.  We discussed return to clinic as needed or if her symptoms worsen.  She expressed understanding. - BLADDER SCAN AMB NON-IMAGING  Return if symptoms worsen or fail to improve.  Carman Ching, PA-C  [facility] [address], [address] [place], [place] [address] [phone]

## 2022-09-02 ENCOUNTER — Ambulatory Visit
Admission: RE | Admit: 2022-09-02 | Discharge: 2022-09-02 | Disposition: A | Discharge status: Home / Self Care | Payer: Medicare Other | Source: Ambulatory Visit | Attending: Family Medicine | Admitting: Family Medicine

## 2022-09-02 DIAGNOSIS — M5416 Radiculopathy, lumbar region: Secondary | ICD-10-CM

## 2022-10-28 ENCOUNTER — Other Ambulatory Visit: Payer: Self-pay

## 2022-10-28 DIAGNOSIS — Z1231 Encounter for screening mammogram for malignant neoplasm of breast: Secondary | ICD-10-CM

## 2022-11-04 ENCOUNTER — Ambulatory Visit
Admission: RE | Admit: 2022-11-04 | Discharge: 2022-11-04 | Disposition: A | Discharge status: Home / Self Care | Payer: Medicare Other | Source: Ambulatory Visit | Attending: Internal Medicine | Admitting: Internal Medicine

## 2022-11-04 DIAGNOSIS — Z1231 Encounter for screening mammogram for malignant neoplasm of breast: Secondary | ICD-10-CM | POA: Diagnosis present

## 2022-11-09 ENCOUNTER — Other Ambulatory Visit: Payer: Self-pay | Admitting: Internal Medicine

## 2022-11-09 DIAGNOSIS — R928 Other abnormal and inconclusive findings on diagnostic imaging of breast: Secondary | ICD-10-CM

## 2022-11-09 DIAGNOSIS — N6489 Other specified disorders of breast: Secondary | ICD-10-CM

## 2022-11-15 ENCOUNTER — Ambulatory Visit
Admission: RE | Admit: 2022-11-15 | Discharge: 2022-11-15 | Disposition: A | Discharge status: Home / Self Care | Payer: Medicare Other | Source: Ambulatory Visit | Attending: Internal Medicine | Admitting: Internal Medicine

## 2022-11-15 DIAGNOSIS — N6489 Other specified disorders of breast: Secondary | ICD-10-CM

## 2022-11-15 DIAGNOSIS — R928 Other abnormal and inconclusive findings on diagnostic imaging of breast: Secondary | ICD-10-CM | POA: Insufficient documentation

## 2023-02-23 ENCOUNTER — Other Ambulatory Visit: Payer: Self-pay

## 2023-02-23 ENCOUNTER — Ambulatory Visit
Admission: RE | Admit: 2023-02-23 | Discharge: 2023-02-23 | Disposition: A | Discharge status: Home / Self Care | Payer: Medicare Other | Source: Ambulatory Visit | Attending: Unknown Physician Specialty | Admitting: Unknown Physician Specialty

## 2023-02-23 DIAGNOSIS — R059 Cough, unspecified: Secondary | ICD-10-CM

## 2023-02-27 ENCOUNTER — Ambulatory Visit: Payer: Medicare Other | Admitting: Urology

## 2023-02-27 ENCOUNTER — Encounter: Payer: Self-pay | Admitting: Urology

## 2023-03-21 ENCOUNTER — Other Ambulatory Visit: Payer: Self-pay

## 2023-03-21 ENCOUNTER — Emergency Department
Admission: EM | Admit: 2023-03-21 | Discharge: 2023-03-21 | Disposition: A | Discharge status: Home / Self Care | Payer: Medicare Other | Attending: Emergency Medicine | Admitting: Emergency Medicine

## 2023-03-21 ENCOUNTER — Emergency Department: Payer: Medicare Other

## 2023-03-21 DIAGNOSIS — I10 Essential (primary) hypertension: Secondary | ICD-10-CM | POA: Insufficient documentation

## 2023-03-21 DIAGNOSIS — R0602 Shortness of breath: Secondary | ICD-10-CM | POA: Diagnosis not present

## 2023-03-21 DIAGNOSIS — H538 Other visual disturbances: Secondary | ICD-10-CM | POA: Diagnosis not present

## 2023-03-21 LAB — COMPREHENSIVE METABOLIC PANEL
ALT: 17 U/L (ref 0–44)
AST: 21 U/L (ref 15–41)
Albumin: 3.5 g/dL (ref 3.5–5.0)
Alkaline Phosphatase: 70 U/L (ref 38–126)
Anion gap: 11 (ref 5–15)
BUN: 18 mg/dL (ref 8–23)
CO2: 26 mmol/L (ref 22–32)
Calcium: 8.9 mg/dL (ref 8.9–10.3)
Chloride: 103 mmol/L (ref 98–111)
Creatinine, Ser: 0.68 mg/dL (ref 0.44–1.00)
GFR, Estimated: >60 mL/min (ref >60)
Glucose, Bld: 91 mg/dL (ref 70–99)
Potassium: 3.5 mmol/L (ref 3.5–5.1)
Sodium: 140 mmol/L (ref 135–145)
Total Bilirubin: 0.8 mg/dL (ref 0.3–1.2)
Total Protein: 7.4 g/dL (ref 6.5–8.1)

## 2023-03-21 LAB — CBC WITH DIFFERENTIAL/PLATELET
Abs Immature Granulocytes: 0.01 10*3/uL (ref 0.00–0.07)
Basophils Absolute: 0 10*3/uL (ref 0.0–0.1)
Basophils Relative: 1 %
Eosinophils Absolute: 0.2 10*3/uL (ref 0.0–0.5)
Eosinophils Relative: 4 %
HCT: 39.8 % (ref 36.0–46.0)
Hemoglobin: 13 g/dL (ref 12.0–15.0)
Immature Granulocytes: 0 %
Lymphocytes Relative: 31 %
Lymphs Abs: 2.1 10*3/uL (ref 0.7–4.0)
MCH: 31.8 pg (ref 26.0–34.0)
MCHC: 32.7 g/dL (ref 30.0–36.0)
MCV: 97.3 fL (ref 80.0–100.0)
Monocytes Absolute: 0.6 10*3/uL (ref 0.1–1.0)
Monocytes Relative: 9 %
Neutro Abs: 3.7 10*3/uL (ref 1.7–7.7)
Neutrophils Relative %: 55 %
Platelets: 200 10*3/uL (ref 150–400)
RBC: 4.09 MIL/uL (ref 3.87–5.11)
RDW: 13 % (ref 11.5–15.5)
WBC: 6.7 10*3/uL (ref 4.0–10.5)
nRBC: 0 % (ref 0.0–0.2)

## 2023-03-21 LAB — BRAIN NATRIURETIC PEPTIDE: B Natriuretic Peptide: 168.7 pg/mL — ABNORMAL HIGH (ref 0.0–100.0)

## 2023-03-21 LAB — TROPONIN I (HIGH SENSITIVITY): Troponin I (High Sensitivity): 6 ng/L (ref <18)

## 2023-03-21 LAB — URINALYSIS, ROUTINE W REFLEX MICROSCOPIC
Bilirubin Urine: NEGATIVE
Glucose, UA: NEGATIVE mg/dL
Hgb urine dipstick: NEGATIVE
Ketones, ur: NEGATIVE mg/dL
Leukocytes,Ua: NEGATIVE
Nitrite: NEGATIVE
Protein, ur: NEGATIVE mg/dL
Specific Gravity, Urine: 1.006 (ref 1.005–1.030)
pH: 7 (ref 5.0–8.0)

## 2023-03-21 LAB — TSH: TSH: 3.646 u[IU]/mL (ref 0.350–4.500)

## 2023-03-21 NOTE — ED Notes (Signed)
Patient transported to CT 

## 2023-03-21 NOTE — Discharge Instructions (Signed)
Your blood pressure is elevated here, but your other tests in the ER do not show any concerning findings.  Follow up with Dr. Doy Hutching.  In the meantime, return to the ER for new, worsening, or persistent severe chest pain, shortness of breath, lightheadedness, vision changes, severe headache, persistent severe blood pressures especially over 180 on the top number or 120 on the bottom number, or any other new or worsening symptoms that concern you.

## 2023-03-21 NOTE — ED Triage Notes (Addendum)
Pt sts that she has been SOB on and off for the last month. Pt sts that she went to the [facility] on advise of her PCP. [facility] sent pt over here to be check out.

## 2023-03-21 NOTE — ED Provider Notes (Signed)
[facility] Provider Note    Event Date/Time   First MD Initiated Contact with Patient 03/21/23 1548     (approximate)   History   Shortness of Breath   HPI  Rachel Harris is a 88 y.o. female with a history of GERD, hyperlipidemia, and depression who presents with complaints which have been occurring over weeks to months.  The states that she has had intermittent episodes of exertional shortness of breath especially when walking, sometimes associated with fluttering in her chest.  They also sometimes occur when she is lying down.  This has been occurring for a few months.  The patient states that she previously had a fluttering in her chest and was worked up by cardiology but nothing was found.  She also reports elevated blood pressure readings recently.  She has had intermittent episodes of blurred vision in her right eye over the last few weeks but is not having any blurry vision currently.  She denies any headache, weakness or numbness, chest pain, fever, or other acute symptoms.  She went to the walk-in clinic today and was sent to the emergency department for further evaluation.  I reviewed the past medical records.  The patient's most recent outpatient encounter was with physical medicine on 2/24 follow-up of degenerative joint disease.  She has no recent ED visits or admissions.   Physical Exam   Triage Vital Signs: ED Triage Vitals  Enc Vitals Group     BP 03/21/23 1538 (!) 189/74     Pulse Rate 03/21/23 1538 (!) 58     Resp 03/21/23 1538 16     Temp 03/21/23 1538 97.7 F (36.5 C)     Temp Source 03/21/23 1538 Oral     SpO2 03/21/23 1538 95 %     Weight 03/21/23 1545 188 lb (85.3 kg)     Height --      Head Circumference --      Peak Flow --      Pain Score 03/21/23 1545 4     Pain Loc --      Pain Edu? --      Excl. in [place]? --     Most recent vital signs: Vitals:   03/21/23 1639 03/21/23 1700  BP:  (!) 181/76  Pulse: (!) 59 (!) 51   Resp: 13 15  Temp:    SpO2: 95% 95%     General: Alert and oriented, well-appearing, no distress.  CV:  Good peripheral perfusion.  Normal heart sounds. Resp:  Normal effort.  Lungs CTAB. Abd:  No distention.  Other:  EOMI.  PERRLA.  No facial droop.  Motor and sensory intact in all extremities.  Normal coordination.   ED Results / Procedures / Treatments   Labs (all labs ordered are listed, but only abnormal results are displayed) Labs Reviewed  BRAIN NATRIURETIC PEPTIDE - Abnormal; Notable for the following components:      Result Value   B Natriuretic Peptide 168.7 (*)    All other components within normal limits  URINALYSIS, ROUTINE W REFLEX MICROSCOPIC - Abnormal; Notable for the following components:   Color, Urine STRAW (*)    APPearance HAZY (*)    All other components within normal limits  CBC WITH DIFFERENTIAL/PLATELET  COMPREHENSIVE METABOLIC PANEL  TSH  TROPONIN I (HIGH SENSITIVITY)     EKG  ED ECG REPORT I, Arta Silence, the attending physician, personally viewed and interpreted this ECG.  Date: 03/21/2023 EKG Time: 1545  Rate: 54 Rhythm: normal sinus rhythm QRS Axis: normal Intervals: normal ST/T Wave abnormalities: normal Narrative Interpretation: no evidence of acute ischemia    RADIOLOGY  Chest x-ray: I independently viewed and interpreted the images; there is no focal consolidation or edema   CT head: No acute abnormality   PROCEDURES:  Critical Care performed: No  Procedures   MEDICATIONS ORDERED IN ED: Medications - No data to display   IMPRESSION / MDM / [place] / ED COURSE  I reviewed the triage vital signs and the nursing notes.  88 year old female with PMH as noted above presents with subacute to chronic episodes of shortness of breath, separate episodes of right-sided blurred vision, and elevated blood pressure.  Differential diagnosis includes, but is not limited to, new onset CHF, COPD, bronchitis,  ACS, cardiac dysrhythmia, dehydration, electrolyte abnormality, other metabolic disturbance, uncontrolled hypertension.  The neurologic exam is normal and the patient does not have any blurred vision or neurodeficits at this time.  There is no evidence of CVA.  We will obtain lab workup including cardiac enzymes and BNP to rule out acute CHF, CT head to rule out recent lacunar infarct or other acute etiology, chest x-ray, and reassess.  Patient's presentation is most consistent with acute complicated illness / injury requiring diagnostic workup.  The patient is on the cardiac monitor to evaluate for evidence of arrhythmia and/or significant heart rate changes.  ----------------------------------------- 6:22 PM on 03/21/2023 -----------------------------------------  The patient's visual acuity has not changed between her 2 eyes.  CT head is negative for acute findings.  Given the lack of any acute neurologic deficits there is no indication for MRI or further emergent workup.  Chest x-ray is clear.  Labs are unremarkable.  Troponin is negative.  BNP is minimally elevated but this is not consistent with CHF.  CBC, BMP, and UA are all negative for acute findings and the TSH is within normal range.  At this time the patient is somewhat hypertensive but there is no evidence of any other acute pathology that would require further emergent workup or inpatient admission.  The patient's blood pressures have been running in the [id number] systolic at home.  She is on propranolol.  I would not increase the dose of this given that she is already borderline bradycardic.  At this time the patient is stable for discharge home with close outpatient follow-up.  I instructed her to contact her PMD.  She may need to be put on an additional antihypertensive.  I counseled her on the results of the workup.  I gave strict return precautions and she expressed understanding.  FINAL CLINICAL IMPRESSION(S) / ED DIAGNOSES   Final  diagnoses:  Shortness of breath  Blurred vision  Hypertension, unspecified type     Rx / DC Orders   ED Discharge Orders     None        Note:  This document was prepared using Dragon voice recognition software and may include unintentional dictation errors.    Arta Silence, MD 03/21/23 [phone]

## 2023-06-21 ENCOUNTER — Emergency Department: Payer: Medicare Other

## 2023-06-21 ENCOUNTER — Emergency Department
Admission: EM | Admit: 2023-06-21 | Discharge: 2023-06-21 | Disposition: A | Discharge status: Home / Self Care | Payer: Medicare Other | Attending: Emergency Medicine | Admitting: Emergency Medicine

## 2023-06-21 ENCOUNTER — Other Ambulatory Visit: Payer: Self-pay

## 2023-06-21 DIAGNOSIS — I10 Essential (primary) hypertension: Secondary | ICD-10-CM | POA: Diagnosis not present

## 2023-06-21 DIAGNOSIS — R531 Weakness: Secondary | ICD-10-CM | POA: Diagnosis not present

## 2023-06-21 DIAGNOSIS — E871 Hypo-osmolality and hyponatremia: Secondary | ICD-10-CM | POA: Insufficient documentation

## 2023-06-21 DIAGNOSIS — R5383 Other fatigue: Secondary | ICD-10-CM | POA: Diagnosis present

## 2023-06-21 LAB — URINALYSIS, W/ REFLEX TO CULTURE (INFECTION SUSPECTED)
Bacteria, UA: NONE SEEN
Bilirubin Urine: NEGATIVE
Glucose, UA: NEGATIVE mg/dL
Hgb urine dipstick: NEGATIVE
Ketones, ur: NEGATIVE mg/dL
Leukocytes,Ua: NEGATIVE
Nitrite: NEGATIVE
Protein, ur: NEGATIVE mg/dL
Specific Gravity, Urine: 1.009 (ref 1.005–1.030)
pH: 7 (ref 5.0–8.0)

## 2023-06-21 LAB — COMPREHENSIVE METABOLIC PANEL
ALT: 17 U/L (ref 0–44)
AST: 21 U/L (ref 15–41)
Albumin: 3.9 g/dL (ref 3.5–5.0)
Alkaline Phosphatase: 71 U/L (ref 38–126)
Anion gap: 9 (ref 5–15)
BUN: 8 mg/dL (ref 8–23)
CO2: 23 mmol/L (ref 22–32)
Calcium: 8.3 mg/dL — ABNORMAL LOW (ref 8.9–10.3)
Chloride: 95 mmol/L — ABNORMAL LOW (ref 98–111)
Creatinine, Ser: 0.79 mg/dL (ref 0.44–1.00)
GFR, Estimated: >60 mL/min (ref >60)
Glucose, Bld: 92 mg/dL (ref 70–99)
Potassium: 3.7 mmol/L (ref 3.5–5.1)
Sodium: 127 mmol/L — ABNORMAL LOW (ref 135–145)
Total Bilirubin: 0.8 mg/dL (ref 0.3–1.2)
Total Protein: 7.5 g/dL (ref 6.5–8.1)

## 2023-06-21 LAB — CBC
HCT: 40.2 % (ref 36.0–46.0)
Hemoglobin: 13.5 g/dL (ref 12.0–15.0)
MCH: 31.5 pg (ref 26.0–34.0)
MCHC: 33.6 g/dL (ref 30.0–36.0)
MCV: 93.9 fL (ref 80.0–100.0)
Platelets: 254 10*3/uL (ref 150–400)
RBC: 4.28 MIL/uL (ref 3.87–5.11)
RDW: 12.3 % (ref 11.5–15.5)
WBC: 5.1 10*3/uL (ref 4.0–10.5)
nRBC: 0 % (ref 0.0–0.2)

## 2023-06-21 LAB — TROPONIN I (HIGH SENSITIVITY): Troponin I (High Sensitivity): 5 ng/L (ref <18)

## 2023-06-21 MED ORDER — SODIUM CHLORIDE 0.9 % IV BOLUS
1000.0000 mL | Freq: Once | INTRAVENOUS | Status: AC
  Administered 2023-06-21: 1000 mL via INTRAVENOUS

## 2023-06-21 NOTE — Discharge Instructions (Signed)
Call Dr. Suzi Roots office to arrange for follow-up and potentially consider changing your blood pressure medication.  The bisoprolol/hydrochlorothiazide in particular could contribute to a low sodium and/or the low heart rate.  Return to the ER immediately for new, worsening, or persistent generalized weakness, lightheadedness, feel like you are going to pass out, chest pain, difficulty breathing, persistent elevated blood pressure readings especially over the 180s on the top number, persistent low heart rate, or any other new or worsening symptoms that concern you.

## 2023-06-21 NOTE — ED Triage Notes (Signed)
Pt states that she feels weak and her bp is high and she is having discomfort center to the right of her chest

## 2023-06-21 NOTE — ED Provider Notes (Signed)
[facility] Provider Note    Event Date/Time   First MD Initiated Contact with Patient 06/21/23 1440     (approximate)   History   Chest Pain and Weakness   HPI  Rachel Harris is a 88 y.o. female with history of GERD hyperlipidemia, depression who presents with generalized fatigue and weakness for approximately last month but worsening over the last week.  The patient also reports elevated blood pressure readings that she is concerned about with systolics in the 150s.  She states she is compliant with all of her blood pressure medications.  She does report that her heart rate has been borderline low in the 50s.  She denies feeling dizzy or lightheaded, but is mainly just fatigue.  She has no chest pain or difficulty breathing acutely.  She denies any urinary symptoms.  She has no vomiting or diarrhea.  The patient does report waking up with night sweats a couple of times over the last several days.  I reviewed the past medical records.  The patient's most recent outpatient counter was with physical medicine on 2/24 for follow-up of degenerative joint disease.  She was seen in the ED in March for shortness of breath and palpitations and had a reassuring workup at that time.   Physical Exam   Triage Vital Signs: ED Triage Vitals  Enc Vitals Group     BP 06/21/23 1059 (!) 146/65     Pulse Rate 06/21/23 1059 (!) 56     Resp 06/21/23 1059 16     Temp 06/21/23 1059 98.1 F (36.7 C)     Temp Source 06/21/23 1059 Oral     SpO2 06/21/23 1059 97 %     Weight 06/21/23 1058 188 lb (85.3 kg)     Height 06/21/23 1058 5\' 6"  (1.676 m)     Head Circumference --      Peak Flow --      Pain Score 06/21/23 1057 6     Pain Loc --      Pain Edu? --      Excl. in GC? --     Most recent vital signs: Vitals:   06/21/23 1621 06/21/23 1803  BP: (!) 160/62   Pulse: (!) 56   Resp: 18   Temp:    SpO2: 97% 97%     General: Alert and oriented, well-appearing, no  distress.  CV:  Good peripheral perfusion.  Normal heart sounds. Resp:  Normal effort. Lungs CTAB. Abd:  Soft and nontender.  No distention.  Other:  Dry mucous membranes.  No peripheral edema.   ED Results / Procedures / Treatments   Labs (all labs ordered are listed, but only abnormal results are displayed) Labs Reviewed  COMPREHENSIVE METABOLIC PANEL - Abnormal; Notable for the following components:      Result Value   Sodium 127 (*)    Chloride 95 (*)    Calcium 8.3 (*)    All other components within normal limits  URINALYSIS, W/ REFLEX TO CULTURE (INFECTION SUSPECTED) - Abnormal; Notable for the following components:   Color, Urine YELLOW (*)    APPearance CLEAR (*)    All other components within normal limits  CBC  TROPONIN I (HIGH SENSITIVITY)     EKG  ED ECG REPORT I, Dionne Bucy, the attending physician, personally viewed and interpreted this ECG.  Date: 06/21/2023 EKG Time: 1057 Rate: 49 Rhythm: Sinus bradycardia QRS Axis: normal Intervals: normal ST/T Wave abnormalities: normal Narrative  Interpretation: no evidence of acute ischemia    RADIOLOGY  Chest x-ray: I independently viewed and interpreted the images; there is no focal consolidation or edema  PROCEDURES:  Critical Care performed: No  Procedures   MEDICATIONS ORDERED IN ED: Medications  sodium chloride 0.9 % bolus 1,000 mL (0 mLs Intravenous Stopped 06/21/23 1725)     IMPRESSION / MDM / ASSESSMENT AND PLAN / ED COURSE  I reviewed the triage vital signs and the nursing notes.  88 year old female with PMH as noted above presents with generalized fatigue as well as some elevated blood pressure readings recently.  She has had some intermittent mild chest discomfort but denies any acute chest pain today.  Physical exam is unremarkable for acute findings except for somewhat dry mucous membranes.  EKG is nonischemic.  Troponin is negative.  Given the duration of the symptoms there is  no indication for repeat.  CMP reveals hyponatremia.  Differential diagnosis includes, but is not limited to, dehydration, medication side effects, hyponatremia, UTI.  There is no clinical evidence for cardiac etiology.  We will give a fluid bolus, obtain urinalysis, and reassess.  Patient's presentation is most consistent with acute complicated illness / injury requiring diagnostic workup.  ----------------------------------------- 5:49 PM on 06/21/2023 -----------------------------------------  The patient is feeling better after fluids.  Urinalysis negative.  She is stable for discharge home.  I have instructed her to contact her primary care physician for follow-up.  I gave strict return precautions and she expressed understanding.  FINAL CLINICAL IMPRESSION(S) / ED DIAGNOSES   Final diagnoses:  Hyponatremia  Hypertension, unspecified type     Rx / DC Orders   ED Discharge Orders     None        Note:  This document was prepared using Dragon voice recognition software and may include unintentional dictation errors.    Dionne Bucy, MD 06/21/23 2008

## 2023-10-17 ENCOUNTER — Other Ambulatory Visit: Payer: Self-pay | Admitting: Internal Medicine

## 2023-10-17 DIAGNOSIS — Z1231 Encounter for screening mammogram for malignant neoplasm of breast: Secondary | ICD-10-CM

## 2023-11-17 ENCOUNTER — Ambulatory Visit
Admission: RE | Admit: 2023-11-17 | Discharge: 2023-11-17 | Disposition: A | Discharge status: Home / Self Care | Payer: Medicare Other | Source: Ambulatory Visit | Attending: Internal Medicine | Admitting: Internal Medicine

## 2023-11-17 DIAGNOSIS — Z1231 Encounter for screening mammogram for malignant neoplasm of breast: Secondary | ICD-10-CM | POA: Diagnosis present

## 2024-03-26 ENCOUNTER — Ambulatory Visit: Payer: Self-pay | Admitting: Surgery

## 2024-03-26 NOTE — H&P (Signed)
 Subjective:   CC: SCC (squamous cell carcinoma) [C44.92]  HPI:  Rachel Harris is a 89 y.o. female who returns for evaluation of above. Hx of SCC in the area by local derm.  Positive margins noted per chart review and was recommended laser therapy for the margins. Underwent two treatments and not told of any additional f/u.   Here today due to persistent pain in area, some discharge at time of eval by [facility].  Today, no discharge. Seeking second opinion since pt reports the original surgeon never saw her in person after initial surgery.  UPDATE: wound continued to be open, TTP.    Past Medical History:  has a past medical history of Barrett esophagus (08/25/2015), Cataract cortical, senile, DDD (degenerative disc disease), Depression, Diverticulosis (08/25/2015), Environmental allergies, Fibrocystic breast disease, Gastritis, GERD (gastroesophageal reflux disease), HH (hiatus hernia) (08/25/2015), History of shingles, Hyperlipidemia, Internal hemorrhoids (08/25/2015), Recurrent sinus infections, and Vaginitis.  Past Surgical History:  has a past surgical history that includes Appendectomy; nissen fundoplication (2004); Tonsillectomy (1964); Cholecystectomy open (1977); Hernia repair (1977); Breast core biopsy (Left, 03/2014); Colonoscopy (08/25/2015); egd (08/25/2015); egd (02/20/2018); Hysterectomy (1970's); Colonoscopy; Upper gastrointestinal endoscopy; and skin cancer removal (12/2022).  Family History: family history includes Breast cancer in her sister; High blood pressure (Hypertension) in her father and mother; Stroke in her father and mother.  Social History:  reports that she has never smoked. She has never used smokeless tobacco. She reports that she does not drink alcohol and does not use drugs.  Current Medications: has a current medication list which includes the following prescription(s): cholecalciferol, diltiazem, estradiol, fosfomycin, ipratropium, mupirocin, omeprazole, and  triamcinolone.  Allergies:  Allergies  Allergen Reactions   Amlodipine Swelling   Amoxicillin Unknown   Ceclor [Cefaclor] Unknown   Codeine Unknown   Erythromycin Unknown   Levaquin [Levofloxacin] Unknown   Levofloxacin In D5w Other (See Comments)   Macrodantin [Nitrofurantoin Macrocrystalline] Unknown   Nitrofurantoin Macrocrystal Other (See Comments)   Zithromax [Azithromycin] Unknown   Sulfa (Sulfonamide Antibiotics) Rash    ROS:  A 15 point review of systems was performed and pertinent positives and negatives noted in HPI   Objective:     BP (!) 147/69   Pulse 69   Ht 167.6 cm (5\' 6" )   Wt 83 kg (183 lb)   BMI 29.54 kg/m   Constitutional :  No distress, cooperative, alert  Lymphatics/Throat:  Supple with no lymphadenopathy  Respiratory:  Clear to auscultation bilaterally  Cardiovascular:  Regular rate and rhythm  Gastrointestinal: Soft, non-tender, non-distended, no organomegaly.  Musculoskeletal: Steady gait and movement  Skin: Cool and moist. Left lateral neck incision with new lesion within middle portion concerning for recurrent SCC with raised border with central ulceration.  Psychiatric: Normal affect, non-agitated, not confused         LABS:  N/a   RADS: N/a  Assessment:      SCC (squamous cell carcinoma) [C44.92] S/p excision and laser treatment for positive margins, posterior according to path report, now presenting with likely recurrence in same area.  Recommend proceeding with surgical re-excision.  Plan:     1. SCC (squamous cell carcinoma) [C44.92]  Discussed surgical excision.  Alternatives include continued observation.  Benefits include possible symptom relief, pathologic evaluation, improved cosmesis. Discussed the risk of surgery including recurrence, chronic pain, post-op infxn, poor cosmesis, poor/delayed wound healing, and possible re-operation to address said risks. The risks of general anesthetic, if used, includes MI, CVA, sudden  death or even  reaction to anesthetic medications also discussed.  Typical post-op recovery time of 3-5 days with possible activity restrictions were also discussed.  The patient verbalized understanding and all questions were answered to the patient's satisfaction.  2. Patient has elected to proceed with surgical treatment. Procedure will be scheduled.   labs/images/medications/previous chart entries reviewed personally and relevant changes/updates noted above.

## 2024-03-26 NOTE — H&P (View-Only) (Signed)
 Subjective:   CC: SCC (squamous cell carcinoma) [C44.92]  HPI:  Rachel Harris is a 89 y.o. female who returns for evaluation of above. Hx of SCC in the area by local derm.  Positive margins noted per chart review and was recommended laser therapy for the margins. Underwent two treatments and not told of any additional f/u.   Here today due to persistent pain in area, some discharge at time of eval by [facility].  Today, no discharge. Seeking second opinion since pt reports the original surgeon never saw her in person after initial surgery.  UPDATE: wound continued to be open, TTP.    Past Medical History:  has a past medical history of Barrett esophagus (08/25/2015), Cataract cortical, senile, DDD (degenerative disc disease), Depression, Diverticulosis (08/25/2015), Environmental allergies, Fibrocystic breast disease, Gastritis, GERD (gastroesophageal reflux disease), HH (hiatus hernia) (08/25/2015), History of shingles, Hyperlipidemia, Internal hemorrhoids (08/25/2015), Recurrent sinus infections, and Vaginitis.  Past Surgical History:  has a past surgical history that includes Appendectomy; nissen fundoplication (2004); Tonsillectomy (1964); Cholecystectomy open (1977); Hernia repair (1977); Breast core biopsy (Left, 03/2014); Colonoscopy (08/25/2015); egd (08/25/2015); egd (02/20/2018); Hysterectomy (1970's); Colonoscopy; Upper gastrointestinal endoscopy; and skin cancer removal (12/2022).  Family History: family history includes Breast cancer in her sister; High blood pressure (Hypertension) in her father and mother; Stroke in her father and mother.  Social History:  reports that she has never smoked. She has never used smokeless tobacco. She reports that she does not drink alcohol and does not use drugs.  Current Medications: has a current medication list which includes the following prescription(s): cholecalciferol, diltiazem, estradiol, fosfomycin, ipratropium, mupirocin, omeprazole, and  triamcinolone.  Allergies:  Allergies  Allergen Reactions   Amlodipine Swelling   Amoxicillin Unknown   Ceclor [Cefaclor] Unknown   Codeine Unknown   Erythromycin Unknown   Levaquin [Levofloxacin] Unknown   Levofloxacin In D5w Other (See Comments)   Macrodantin [Nitrofurantoin Macrocrystalline] Unknown   Nitrofurantoin Macrocrystal Other (See Comments)   Zithromax [Azithromycin] Unknown   Sulfa (Sulfonamide Antibiotics) Rash    ROS:  A 15 point review of systems was performed and pertinent positives and negatives noted in HPI   Objective:     BP (!) 147/69   Pulse 69   Ht 167.6 cm (5\' 6" )   Wt 83 kg (183 lb)   BMI 29.54 kg/m   Constitutional :  No distress, cooperative, alert  Lymphatics/Throat:  Supple with no lymphadenopathy  Respiratory:  Clear to auscultation bilaterally  Cardiovascular:  Regular rate and rhythm  Gastrointestinal: Soft, non-tender, non-distended, no organomegaly.  Musculoskeletal: Steady gait and movement  Skin: Cool and moist. Left lateral neck incision with new lesion within middle portion concerning for recurrent SCC with raised border with central ulceration.  Psychiatric: Normal affect, non-agitated, not confused         LABS:  N/a   RADS: N/a  Assessment:      SCC (squamous cell carcinoma) [C44.92] S/p excision and laser treatment for positive margins, posterior according to path report, now presenting with likely recurrence in same area.  Recommend proceeding with surgical re-excision.  Plan:     1. SCC (squamous cell carcinoma) [C44.92]  Discussed surgical excision.  Alternatives include continued observation.  Benefits include possible symptom relief, pathologic evaluation, improved cosmesis. Discussed the risk of surgery including recurrence, chronic pain, post-op infxn, poor cosmesis, poor/delayed wound healing, and possible re-operation to address said risks. The risks of general anesthetic, if used, includes MI, CVA, sudden  death or even  reaction to anesthetic medications also discussed.  Typical post-op recovery time of 3-5 days with possible activity restrictions were also discussed.  The patient verbalized understanding and all questions were answered to the patient's satisfaction.  2. Patient has elected to proceed with surgical treatment. Procedure will be scheduled.   labs/images/medications/previous chart entries reviewed personally and relevant changes/updates noted above.

## 2024-04-11 ENCOUNTER — Encounter: Payer: Self-pay | Admitting: Surgery

## 2024-04-11 ENCOUNTER — Other Ambulatory Visit: Payer: Self-pay

## 2024-04-11 ENCOUNTER — Encounter
Admission: RE | Admit: 2024-04-11 | Discharge: 2024-04-11 | Disposition: A | Discharge status: Home / Self Care | Source: Ambulatory Visit | Attending: Surgery | Admitting: Surgery

## 2024-04-11 VITALS — Ht 66.0 in | Wt 183.0 lb

## 2024-04-11 DIAGNOSIS — Z01812 Encounter for preprocedural laboratory examination: Secondary | ICD-10-CM

## 2024-04-11 DIAGNOSIS — I10 Essential (primary) hypertension: Secondary | ICD-10-CM

## 2024-04-11 DIAGNOSIS — D044 Carcinoma in situ of skin of scalp and neck: Secondary | ICD-10-CM

## 2024-04-11 NOTE — Patient Instructions (Addendum)
 Your procedure is scheduled on: Friday, April 25,2025  Report to the Registration Desk on the 1st floor of the [facility] To find out your arrival time, please call [phone] between 1PM - 3PM on: Thursday, April 24 If your arrival time is 6:00 am, do not arrive before that time as the [facility] entrance doors do not open until 6:00 am.  REMEMBER: Instructions that are not followed completely may result in serious medical risk, up to and including death; or upon the discretion of your surgeon and anesthesiologist your surgery may need to be rescheduled.  Do not eat food after midnight the night before surgery.  No gum chewing or hard candies.  You may however, drink CLEAR liquids up to 2 hours before you are scheduled to arrive for your surgery. Do not drink anything within 2 hours of your scheduled arrival time.  Clear liquids include: - water  - apple juice without pulp - gatorade (not RED colors) - black coffee or tea (Do NOT add milk or creamers to the coffee or tea) Do NOT drink anything that is not on this list.  One week prior to surgery: Stop Anti-inflammatories (NSAIDS) such as Advil, Aleve, Ibuprofen, Motrin, Naproxen, Naprosyn and Aspirin based products such as Excedrin, Goody's Powder, BC Powder. Stop ANY OVER THE COUNTER supplements until after surgery.  You may however, continue to take Tylenol if needed for pain up until the day of surgery.  Please ask recommendation with regards to aspirin from your primary care physician and follow the instructions.  Continue taking all of your other prescription medications up until the day of surgery.  ON THE DAY OF SURGERY ONLY TAKE THESE MEDICATIONS WITH SIPS OF WATER:  diltiazem (CARDIZEM CD) omeprazole (PRILOSEC) traMADol (ULTRAM)   No Alcohol for 24 hours before or after surgery.  No Smoking including e-cigarettes for 24 hours before surgery.  No chewable tobacco products for at least 6 hours before surgery.   No nicotine patches on the day of surgery.  Do not use any "recreational" drugs for at least a week (preferably 2 weeks) before your surgery.  Please be advised that the combination of cocaine and anesthesia may have negative outcomes, up to and including death. If you test positive for cocaine, your surgery will be cancelled.  On the morning of surgery brush your teeth with toothpaste and water, you may rinse your mouth with mouthwash if you wish. Do not swallow any toothpaste or mouthwash.  Use CHG Soap as directed on instruction sheet._provided for you  Do not wear jewelry, make-up, hairpins, clips or nail polish.  For welded (permanent) jewelry: bracelets, anklets, waist bands, etc.  Please have this removed prior to surgery.  If it is not removed, there is a chance that hospital personnel will need to cut it off on the day of surgery.  Do not wear lotions, powders, or perfumes.   Do not shave body hair from the neck down 48 hours before surgery.  Contact lenses, hearing aids and dentures may not be worn into surgery.  Do not bring valuables to the hospital. [facility] is not responsible for any missing/lost belongings or valuables.   Notify your doctor if there is any change in your medical condition (cold, fever, infection).  Wear comfortable clothing (specific to your surgery type) to the hospital.  After surgery, you can help prevent lung complications by doing breathing exercises.  Take deep breaths and cough every 1-2 hours. Your doctor may order a device  called an Facilities manager to help you take deep breaths.   If you are being discharged the day of surgery, you will not be allowed to drive home. You will need a responsible individual to drive you home and stay with you for 24 hours after surgery.    Please call the Pre-admissions Testing Dept. at [phone] if you have any questions about these instructions.  Surgery Visitation Policy:  Patients having  surgery or a procedure may have two visitors.  Children under the age of 66 must have an adult with them who is not the patient.    Preparing for Surgery with CHLORHEXIDINE GLUCONATE (CHG) Soap  Chlorhexidine Gluconate (CHG) Soap  o An antiseptic cleaner that kills germs and bonds with the skin to continue killing germs even after washing  o Used for showering the night before surgery and morning of surgery  Before surgery, you can play an important role by reducing the number of germs on your skin.  CHG (Chlorhexidine gluconate) soap is an antiseptic cleanser which kills germs and bonds with the skin to continue killing germs even after washing.  Please do not use if you have an allergy to CHG or antibacterial soaps. If your skin becomes reddened/irritated stop using the CHG.  1. Shower the NIGHT BEFORE SURGERY and the MORNING OF SURGERY with CHG soap.  2. If you choose to wash your hair, wash your hair first as usual with your normal shampoo.  3. After shampooing, rinse your hair and body thoroughly to remove the shampoo.  4. Use CHG as you would any other liquid soap. You can apply CHG directly to the skin and wash gently with a scrungie or a clean washcloth.  5. Apply the CHG soap to your body only from the neck down. Do not use on open wounds or open sores. Avoid contact with your eyes, ears, mouth, and genitals (private parts). Wash face and genitals (private parts) with your normal soap.  6. Wash thoroughly, paying special attention to the area where your surgery will be performed.  7. Thoroughly rinse your body with warm water.  8. Do not shower/wash with your normal soap after using and rinsing off the CHG soap.  9. Pat yourself dry with a clean towel.  10. Wear clean pajamas to bed the night before surgery.  12. Place clean sheets on your bed the night of your first shower and do not sleep with pets.  13. Shower again with the CHG soap on the day of surgery prior to  arriving at the hospital.  14. Do not apply any deodorants/lotions/powders.  15. Please wear clean clothes to the hospital.

## 2024-04-15 ENCOUNTER — Encounter
Admission: RE | Admit: 2024-04-15 | Discharge: 2024-04-15 | Disposition: A | Discharge status: Home / Self Care | Source: Ambulatory Visit | Attending: Surgery | Admitting: Surgery

## 2024-04-15 DIAGNOSIS — D044 Carcinoma in situ of skin of scalp and neck: Secondary | ICD-10-CM | POA: Insufficient documentation

## 2024-04-15 DIAGNOSIS — I10 Essential (primary) hypertension: Secondary | ICD-10-CM | POA: Diagnosis not present

## 2024-04-15 DIAGNOSIS — Z01810 Encounter for preprocedural cardiovascular examination: Secondary | ICD-10-CM | POA: Insufficient documentation

## 2024-04-15 DIAGNOSIS — Z01812 Encounter for preprocedural laboratory examination: Secondary | ICD-10-CM

## 2024-04-18 MED ORDER — CEFAZOLIN SODIUM-DEXTROSE 2-4 GM/100ML-% IV SOLN
2.0000 g | INTRAVENOUS | Status: AC
  Administered 2024-04-19: 2 g via INTRAVENOUS

## 2024-04-18 MED ORDER — CHLORHEXIDINE GLUCONATE 0.12 % MT SOLN: 15.0000 mL | Freq: Once | OROMUCOSAL | Status: DC

## 2024-04-18 MED ORDER — CHLORHEXIDINE GLUCONATE CLOTH 2 % EX PADS: 6.0000 | MEDICATED_PAD | Freq: Once | CUTANEOUS | Status: DC

## 2024-04-18 MED ORDER — LACTATED RINGERS IV SOLN
INTRAVENOUS | Status: DC
Start: 2024-04-18 — End: 2024-04-19

## 2024-04-18 MED ORDER — ORAL CARE MOUTH RINSE: 15.0000 mL | Freq: Once | OROMUCOSAL | Status: DC

## 2024-04-19 ENCOUNTER — Ambulatory Visit: Admitting: Certified Registered"

## 2024-04-19 ENCOUNTER — Encounter: Payer: Self-pay | Admitting: Surgery

## 2024-04-19 ENCOUNTER — Other Ambulatory Visit: Payer: Self-pay

## 2024-04-19 ENCOUNTER — Encounter
Admission: RE | Disposition: A | Discharge status: Home / Self Care | Payer: Self-pay | Source: Home / Self Care | Attending: Surgery

## 2024-04-19 ENCOUNTER — Ambulatory Visit
Admission: RE | Admit: 2024-04-19 | Discharge: 2024-04-19 | Disposition: A | Discharge status: Home / Self Care | Attending: Surgery | Admitting: Surgery

## 2024-04-19 DIAGNOSIS — K219 Gastro-esophageal reflux disease without esophagitis: Secondary | ICD-10-CM | POA: Insufficient documentation

## 2024-04-19 DIAGNOSIS — Z79899 Other long term (current) drug therapy: Secondary | ICD-10-CM | POA: Diagnosis not present

## 2024-04-19 DIAGNOSIS — C4442 Squamous cell carcinoma of skin of scalp and neck: Secondary | ICD-10-CM | POA: Diagnosis not present

## 2024-04-19 DIAGNOSIS — I10 Essential (primary) hypertension: Secondary | ICD-10-CM | POA: Insufficient documentation

## 2024-04-19 DIAGNOSIS — C4492 Squamous cell carcinoma of skin, unspecified: Secondary | ICD-10-CM | POA: Diagnosis present

## 2024-04-19 DIAGNOSIS — L989 Disorder of the skin and subcutaneous tissue, unspecified: Secondary | ICD-10-CM

## 2024-04-19 HISTORY — DX: Anxiety disorder, unspecified: F41.9

## 2024-04-19 HISTORY — PX: LESION REMOVAL: SHX5196

## 2024-04-19 SURGERY — EXCISION, LESION, NECK
Anesthesia: General | Site: Neck | Laterality: Left

## 2024-04-19 MED ORDER — BUPIVACAINE-EPINEPHRINE 0.5% -1:200000 IJ SOLN
INTRAMUSCULAR | Status: DC | PRN
  Administered 2024-04-19: 10 mL

## 2024-04-19 MED ORDER — FENTANYL CITRATE (PF) 100 MCG/2ML IJ SOLN
INTRAMUSCULAR | Status: AC
  Filled 2024-04-19: qty 2

## 2024-04-19 MED ORDER — TRAMADOL HCL 50 MG PO TABS: 50.0000 mg | ORAL_TABLET | ORAL | 0 refills | Status: AC | PRN

## 2024-04-19 MED ORDER — FENTANYL CITRATE (PF) 100 MCG/2ML IJ SOLN
INTRAMUSCULAR | Status: DC | PRN
  Administered 2024-04-19 (×2): 25 ug via INTRAVENOUS

## 2024-04-19 MED ORDER — PROPOFOL 500 MG/50ML IV EMUL
INTRAVENOUS | Status: DC | PRN
  Administered 2024-04-19: 150 ug/kg/min via INTRAVENOUS

## 2024-04-19 MED ORDER — LIDOCAINE HCL (PF) 1 % IJ SOLN
INTRAMUSCULAR | Status: AC
  Filled 2024-04-19: qty 30

## 2024-04-19 MED ORDER — CEFAZOLIN SODIUM-DEXTROSE 2-4 GM/100ML-% IV SOLN
INTRAVENOUS | Status: AC
  Filled 2024-04-19: qty 100

## 2024-04-19 MED ORDER — BUPIVACAINE-EPINEPHRINE (PF) 0.5% -1:200000 IJ SOLN
INTRAMUSCULAR | Status: AC
  Filled 2024-04-19: qty 30

## 2024-04-19 SURGICAL SUPPLY — 24 items
BLADE SURG 15 STRL LF DISP TIS (BLADE) ×1 IMPLANT
DERMABOND ADVANCED .7 DNX12 (GAUZE/BANDAGES/DRESSINGS) ×1 IMPLANT
DRAPE LAPAROTOMY 100X77 ABD (DRAPES) ×1 IMPLANT
DRAPE SHEET LG 3/4 BI-LAMINATE (DRAPES) ×1 IMPLANT
ELECTRODE REM PT RTRN 9FT ADLT (ELECTROSURGICAL) ×1 IMPLANT
GAUZE 4X4 16PLY ~~LOC~~+RFID DBL (SPONGE) IMPLANT
GLOVE BIOGEL PI IND STRL 7.0 (GLOVE) ×1 IMPLANT
GLOVE SURG SYN 6.5 ES PF (GLOVE) ×1 IMPLANT
GLOVE SURG SYN 6.5 PF PI (GLOVE) ×1 IMPLANT
GOWN STRL REUS W/ TWL LRG LVL3 (GOWN DISPOSABLE) ×2 IMPLANT
KIT TURNOVER KIT A (KITS) ×1 IMPLANT
LABEL OR SOLS (LABEL) ×1 IMPLANT
MANIFOLD NEPTUNE II (INSTRUMENTS) ×1 IMPLANT
NDL HYPO 22X1.5 SAFETY MO (MISCELLANEOUS) ×1 IMPLANT
NEEDLE HYPO 22X1.5 SAFETY MO (MISCELLANEOUS) ×1 IMPLANT
NS IRRIG 1000ML POUR BTL (IV SOLUTION) ×1 IMPLANT
PACK BASIN MINOR ARMC (MISCELLANEOUS) ×1 IMPLANT
SUT VIC AB 3-0 SH 27X BRD (SUTURE) ×1 IMPLANT
SUTURE EHLN 3-0 FS-10 30 BLK (SUTURE) IMPLANT
SUTURE MNCRL 4-0 27XMF (SUTURE) ×1 IMPLANT
SYR 20ML LL LF (SYRINGE) ×1 IMPLANT
TOWEL OR 17X26 4PK STRL BLUE (TOWEL DISPOSABLE) IMPLANT
TRAP FLUID SMOKE EVACUATOR (MISCELLANEOUS) ×1 IMPLANT
WATER STERILE IRR 500ML POUR (IV SOLUTION) ×1 IMPLANT

## 2024-04-19 NOTE — Anesthesia Postprocedure Evaluation (Signed)
 Anesthesia Post Note  Patient: Rachel Harris  Procedure(s) Performed: EXCISION, LESION, NECK (Left: Neck)  Patient location during evaluation: PACU Anesthesia Type: General Level of consciousness: awake and alert Pain management: pain level controlled Vital Signs Assessment: post-procedure vital signs reviewed and stable Respiratory status: spontaneous breathing, nonlabored ventilation and respiratory function stable Cardiovascular status: blood pressure returned to baseline and stable Postop Assessment: no apparent nausea or vomiting Anesthetic complications: no   No notable events documented.   Last Vitals:  Vitals:   04/19/24 1030 04/19/24 1056  BP: (!) 153/75 (!) 181/85  Pulse: 74 72  Resp: 15 16  Temp: (!) 36.2 C 36.4 C  SpO2: 97% 96%    Last Pain:  Vitals:   04/19/24 1056  TempSrc: Temporal  PainSc: 4                  Baltazar Bonier

## 2024-04-19 NOTE — Interval H&P Note (Signed)
 Reported non-predict diffuse popular like rash throughout her entire body. Review systems otherwise negative for concerns of any illness. Discussed with anesthesiology and OK to continue from a surgical standpoint because it does not pose a infectious risk.

## 2024-04-19 NOTE — Discharge Instructions (Signed)
 Removal, Care After This sheet gives you information about how to care for yourself after your procedure. Your health care provider may also give you more specific instructions. If you have problems or questions, contact your health care provider. What can I expect after the procedure? After the procedure, it is common to have: Soreness. Bruising. Itching. Follow these instructions at home: site care Follow instructions from your health care provider about how to take care of your site. Make sure you: Wash your hands with soap and water before and after you change your bandage (dressing). If soap and water are not available, use hand sanitizer. Leave stitches (sutures), skin glue, or adhesive strips in place. These skin closures may need to stay in place for 2 weeks or longer. If adhesive strip edges start to loosen and curl up, you may trim the loose edges. Do not remove adhesive strips completely unless your health care provider tells you to do that. If the area bleeds or bruises, apply gentle pressure for 10 minutes. OK TO SHOWER IN 24HRS  Check your site every day for signs of infection. Check for: Redness, swelling, or pain. Fluid or blood. Warmth. Pus or a bad smell.  General instructions Rest and then return to your normal activities as told by your health care provider.  tylenol and advil as needed for discomfort.  Please alternate between the two every four hours as needed for pain.    Use narcotics, if prescribed, only when tylenol and motrin is not enough to control pain.  325-650mg  every 8hrs to max of 3000mg /24hrs (including the 325mg  in every norco dose) for the tylenol.    Advil up to 800mg  per dose every 8hrs as needed for pain.   Keep all follow-up visits as told by your health care provider. This is important. Contact a health care provider if: You have redness, swelling, or pain around your site. You have fluid or blood coming from your site. Your site feels warm to  the touch. You have pus or a bad smell coming from your site. You have a fever. Your sutures, skin glue, or adhesive strips loosen or come off sooner than expected. Get help right away if: You have bleeding that does not stop with pressure or a dressing. Summary After the procedure, it is common to have some soreness, bruising, and itching at the site. Follow instructions from your health care provider about how to take care of your site. Check your site every day for signs of infection. Contact a health care provider if you have redness, swelling, or pain around your site, or your site feels warm to the touch. Keep all follow-up visits as told by your health care provider. This is important. This information is not intended to replace advice given to you by your health care provider. Make sure you discuss any questions you have with your health care provider. Document Released: 01/08/2016 Document Revised: 06/11/2018 Document Reviewed: 06/11/2018 Elsevier Interactive Patient Education  [employer].

## 2024-04-19 NOTE — Anesthesia Preprocedure Evaluation (Addendum)
 Anesthesia Evaluation  Patient identified by MRN, date of birth, ID band Patient awake    Reviewed: Allergy & Precautions, H&P , NPO status , Patient's Chart, lab work & pertinent test results  Airway Mallampati: II  TM Distance: >3 FB Neck ROM: full    Dental  (+) Partial Lower, Partial Upper   Pulmonary neg pulmonary ROS   Pulmonary exam normal        Cardiovascular hypertension, Normal cardiovascular exam     Neuro/Psych  PSYCHIATRIC DISORDERS Anxiety Depression    negative neurological ROS     GI/Hepatic Neg liver ROS,GERD  Controlled and Medicated,,S/p nissan fundoplication   Endo/Other  negative endocrine ROS    Renal/GU negative Renal ROS  negative genitourinary   Musculoskeletal   Abdominal Normal abdominal exam  (+)   Peds  Hematology negative hematology ROS (+)   Anesthesia Other Findings Rash is present  History of shingles tx in 03/2024 Past Medical History: No date: Anxiety No date: Barrett esophagus No date: Cataract cortical, senile No date: Degenerative disc disease, cervical No date: Depression No date: Diverticulosis No date: Environmental allergies No date: Exostosis No date: Fibrocystic breast disease No date: Fibrocystic breast disease No date: Gastritis No date: GERD (gastroesophageal reflux disease) No date: Hallux valgus No date: HH (hiatus hernia) No date: History of degenerative disc disease No date: History of shingles No date: Hypercholesterolemia No date: Hyperlipidemia No date: Hypertension No date: Internal hemorrhoids No date: Menopausal syndrome No date: Recurrent sinus infections No date: Vaginitis  Past Surgical History: No date: ABDOMINAL HYSTERECTOMY No date: APPENDECTOMY 2015: BREAST BIOPSY; Left     Comment:  APOCRINE CYST 2015: BREAST CYST ASPIRATION; Left     Comment:  neg No date: BREAST SURGERY     Comment:  biopsy No date: CATARACT EXTRACTION W/  INTRAOCULAR LENS IMPLANTW/  TRABECULECTOMY No date: CHOLECYSTECTOMY 08/25/2015: COLONOSCOPY WITH PROPOFOL ; N/A     Comment:  Procedure: COLONOSCOPY WITH PROPOFOL ;  Surgeon: Deveron Fly, MD;  Location: [facility] ENDOSCOPY;  Service:               Endoscopy;  Laterality: N/A; 08/25/2015: ESOPHAGOGASTRODUODENOSCOPY (EGD) WITH PROPOFOL ; N/A     Comment:  Procedure: ESOPHAGOGASTRODUODENOSCOPY (EGD) WITH               PROPOFOL ;  Surgeon: Deveron Fly, MD;  Location:               [facility] ENDOSCOPY;  Service: Endoscopy;  Laterality: N/A; 02/20/2018: ESOPHAGOGASTRODUODENOSCOPY (EGD) WITH PROPOFOL ; N/A     Comment:  Procedure: ESOPHAGOGASTRODUODENOSCOPY (EGD) WITH               PROPOFOL ;  Surgeon: Deveron Fly, MD;  Location:               [facility] ENDOSCOPY;  Service: Endoscopy;  Laterality: N/A; No date: HERNIA REPAIR No date: NISSEN FUNDOPLICATION No date: TONSILLECTOMY     Reproductive/Obstetrics negative OB ROS                             Anesthesia Physical Anesthesia Plan  ASA: 2  Anesthesia Plan: General   Post-op Pain Management: Regional block* and Minimal or no pain anticipated   Induction: Intravenous  PONV Risk Score and Plan: Propofol  infusion and TIVA  Airway Management Planned: Natural Airway and Simple Face Mask  Additional Equipment:  Intra-op Plan:   Post-operative Plan:   Informed Consent: I have reviewed the patients History and Physical, chart, labs and discussed the procedure including the risks, benefits and alternatives for the proposed anesthesia with the patient or authorized representative who has indicated his/her understanding and acceptance.     Dental Advisory Given  Plan Discussed with: CRNA and Surgeon  Anesthesia Plan Comments:        Anesthesia Quick Evaluation

## 2024-04-19 NOTE — Transfer of Care (Signed)
 Immediate Anesthesia Transfer of Care Note  Patient: Rachel Harris  Procedure(s) Performed: EXCISION, LESION, NECK (Left: Neck)  Patient Location: PACU  Anesthesia Type:General  Level of Consciousness: awake  Airway & Oxygen Therapy: Patient Spontanous Breathing  Post-op Assessment: Report given to RN and Post -op Vital signs reviewed and stable  Post vital signs: Reviewed and stable  Last Vitals:  Vitals Value Taken Time  BP 160/73 04/19/24 0948  Temp 36.9 C 04/19/24 0948  Pulse 94 04/19/24 0951  Resp 27 04/19/24 0951  SpO2 96 % 04/19/24 0951  Vitals shown include unfiled device data.  Last Pain:  Vitals:   04/19/24 0948  PainSc: 0-No pain         Complications: No notable events documented.

## 2024-04-19 NOTE — Op Note (Signed)
 Pre-Op Dx: neck lesion Post-Op Dx: same Anesthesia: MAC EBL: 15ml Complications:  none apparent Specimen: neck lesion Procedure: excisional biopsy of neck lesion Surgeon: Rosea Conch  Indications for procedure: See H&P  Description of Procedure:  Consent obtained, time out performed.  Patient placed in supine position.  Area sterilized and draped in usual position.  Local infused to area previously marked.  6cm elliptical incision made through dermis with 15blade around lesion. The  2cm x 2.3cm x 2.5cm lesion then removed from surrounding tissue completely using electrocautery down to fascia, passed off field pending pathology. Wound hemostasis noted, then closed with interrupted 4-0 monocryl in subcuticular fashion for epidermal layer.  Wound then dressed with dermabond.  Pt tolerated procedure well, and transferred to PACU in stable condition. Sponge and instrument count correct at end of procedure.

## 2024-04-19 NOTE — Anesthesia Procedure Notes (Signed)
 Procedure Name: MAC Date/Time: 04/19/2024 9:17 AM  Performed by: Donnamae Gaba, CRNAPre-anesthesia Checklist: Patient identified, Emergency Drugs available, Suction available, Patient being monitored and Timeout performed Patient Re-evaluated:Patient Re-evaluated prior to induction Oxygen Delivery Method: Nasal cannula Induction Type: IV induction Placement Confirmation: positive ETCO2 and CO2 detector

## 2024-04-20 ENCOUNTER — Encounter: Payer: Self-pay | Admitting: Surgery

## 2024-04-23 LAB — SURGICAL PATHOLOGY

## 2024-04-28 ENCOUNTER — Encounter: Payer: Self-pay | Admitting: Emergency Medicine

## 2024-04-28 ENCOUNTER — Other Ambulatory Visit: Payer: Self-pay

## 2024-04-28 ENCOUNTER — Emergency Department
Admission: EM | Admit: 2024-04-28 | Discharge: 2024-04-28 | Disposition: A | Discharge status: Home / Self Care | Attending: Emergency Medicine | Admitting: Emergency Medicine

## 2024-04-28 ENCOUNTER — Emergency Department

## 2024-04-28 DIAGNOSIS — R531 Weakness: Secondary | ICD-10-CM | POA: Diagnosis present

## 2024-04-28 DIAGNOSIS — I10 Essential (primary) hypertension: Secondary | ICD-10-CM | POA: Diagnosis not present

## 2024-04-28 DIAGNOSIS — E871 Hypo-osmolality and hyponatremia: Secondary | ICD-10-CM | POA: Diagnosis not present

## 2024-04-28 DIAGNOSIS — E860 Dehydration: Secondary | ICD-10-CM | POA: Insufficient documentation

## 2024-04-28 LAB — COMPREHENSIVE METABOLIC PANEL WITH GFR
ALT: 19 U/L (ref 0–44)
AST: 22 U/L (ref 15–41)
Albumin: 3.2 g/dL — ABNORMAL LOW (ref 3.5–5.0)
Alkaline Phosphatase: 78 U/L (ref 38–126)
Anion gap: 3 — ABNORMAL LOW (ref 5–15)
BUN: 17 mg/dL (ref 8–23)
CO2: 26 mmol/L (ref 22–32)
Calcium: 7.9 mg/dL — ABNORMAL LOW (ref 8.9–10.3)
Chloride: 103 mmol/L (ref 98–111)
Creatinine, Ser: 0.88 mg/dL (ref 0.44–1.00)
GFR, Estimated: >60 mL/min (ref >60)
Glucose, Bld: 128 mg/dL — ABNORMAL HIGH (ref 70–99)
Potassium: 4.2 mmol/L (ref 3.5–5.1)
Sodium: 132 mmol/L — ABNORMAL LOW (ref 135–145)
Total Bilirubin: 0.7 mg/dL (ref 0.0–1.2)
Total Protein: 7 g/dL (ref 6.5–8.1)

## 2024-04-28 LAB — CBC WITH DIFFERENTIAL/PLATELET
Abs Immature Granulocytes: 0.03 10*3/uL (ref 0.00–0.07)
Basophils Absolute: 0.1 10*3/uL (ref 0.0–0.1)
Basophils Relative: 1 %
Eosinophils Absolute: 0.7 10*3/uL — ABNORMAL HIGH (ref 0.0–0.5)
Eosinophils Relative: 9 %
HCT: 42.1 % (ref 36.0–46.0)
Hemoglobin: 14 g/dL (ref 12.0–15.0)
Immature Granulocytes: 0 %
Lymphocytes Relative: 10 %
Lymphs Abs: 0.9 10*3/uL (ref 0.7–4.0)
MCH: 32 pg (ref 26.0–34.0)
MCHC: 33.3 g/dL (ref 30.0–36.0)
MCV: 96.3 fL (ref 80.0–100.0)
Monocytes Absolute: 0.8 10*3/uL (ref 0.1–1.0)
Monocytes Relative: 9 %
Neutro Abs: 5.9 10*3/uL (ref 1.7–7.7)
Neutrophils Relative %: 71 %
Platelets: 182 10*3/uL (ref 150–400)
RBC: 4.37 MIL/uL (ref 3.87–5.11)
RDW: 13.5 % (ref 11.5–15.5)
WBC: 8.3 10*3/uL (ref 4.0–10.5)
nRBC: 0 % (ref 0.0–0.2)

## 2024-04-28 LAB — URINALYSIS, ROUTINE W REFLEX MICROSCOPIC
Bilirubin Urine: NEGATIVE
Glucose, UA: NEGATIVE mg/dL
Hgb urine dipstick: NEGATIVE
Ketones, ur: 5 mg/dL — AB
Leukocytes,Ua: NEGATIVE
Nitrite: NEGATIVE
Protein, ur: NEGATIVE mg/dL
Specific Gravity, Urine: 1.009 (ref 1.005–1.030)
pH: 8 (ref 5.0–8.0)

## 2024-04-28 MED ORDER — SODIUM CHLORIDE 0.9 % IV BOLUS
1000.0000 mL | Freq: Once | INTRAVENOUS | Status: AC
  Administered 2024-04-28: 1000 mL via INTRAVENOUS

## 2024-04-28 MED ORDER — PANTOPRAZOLE SODIUM 40 MG IV SOLR
40.0000 mg | Freq: Once | INTRAVENOUS | Status: AC
  Administered 2024-04-28: 40 mg via INTRAVENOUS
  Filled 2024-04-28: qty 10

## 2024-04-28 NOTE — ED Notes (Signed)
 Pt passed PO challenge successfully

## 2024-04-28 NOTE — ED Triage Notes (Signed)
 Patient to ED via POV for generalized weakness. Ongoing for a couple days. States had shingles for the last couple of weeks but they "are about gone." Concerned for dehydration. Pt reports worsening back pain.

## 2024-04-28 NOTE — ED Provider Notes (Signed)
 [facility] Provider Note    Event Date/Time   First MD Initiated Contact with Patient 04/28/24 1739     (approximate)   History   Chief Complaint: Weakness   HPI  Rachel Harris is a 89 y.o. female with a history of anxiety depression GERD gastritis hypertension who comes ED complaining of poor appetite and poor oral intake for the last 3 days.  Because of this she is feeling very fatigued.  Denies fevers chills or pain, no shortness of breath or cough no falls or trauma.  No dizziness.  She notes that 2 weeks ago she had shingles on right upper extremity but the lesions have all healed by now and symptoms are improving.        Past Medical History:  Diagnosis Date   Anxiety    Barrett esophagus    Cataract cortical, senile    Degenerative disc disease, cervical    Depression    Diverticulosis    Environmental allergies    Exostosis    Fibrocystic breast disease    Fibrocystic breast disease    Gastritis    GERD (gastroesophageal reflux disease)    Hallux valgus    HH (hiatus hernia)    History of degenerative disc disease    History of shingles    Hypercholesterolemia    Hyperlipidemia    Hypertension    Internal hemorrhoids    Menopausal syndrome    Recurrent sinus infections    Vaginitis     Current Outpatient Rx   Order #: [id number] Class: Historical Med   Order #: [id number] Class: Historical Med   Order #: [id number] Class: Historical Med   Order #: [id number] Class: Historical Med   Order #: [id number] Class: Historical Med   Order #: [id number] Class: Normal    Past Surgical History:  Procedure Laterality Date   ABDOMINAL HYSTERECTOMY     APPENDECTOMY     BREAST BIOPSY Left 2015   APOCRINE CYST   BREAST CYST ASPIRATION Left 2015   neg   BREAST SURGERY     biopsy   CATARACT EXTRACTION W/ INTRAOCULAR LENS IMPLANTW/ TRABECULECTOMY     CHOLECYSTECTOMY     COLONOSCOPY WITH PROPOFOL  N/A 08/25/2015   Procedure: COLONOSCOPY WITH  PROPOFOL ;  Surgeon: Deveron Fly, MD;  Location: [facility] ENDOSCOPY;  Service: Endoscopy;  Laterality: N/A;   ESOPHAGOGASTRODUODENOSCOPY (EGD) WITH PROPOFOL  N/A 08/25/2015   Procedure: ESOPHAGOGASTRODUODENOSCOPY (EGD) WITH PROPOFOL ;  Surgeon: Deveron Fly, MD;  Location: [facility] ENDOSCOPY;  Service: Endoscopy;  Laterality: N/A;   ESOPHAGOGASTRODUODENOSCOPY (EGD) WITH PROPOFOL  N/A 02/20/2018   Procedure: ESOPHAGOGASTRODUODENOSCOPY (EGD) WITH PROPOFOL ;  Surgeon: Deveron Fly, MD;  Location: [facility] ENDOSCOPY;  Service: Endoscopy;  Laterality: N/A;   HERNIA REPAIR     LESION REMOVAL Left 04/19/2024   Procedure: EXCISION, LESION, NECK;  Surgeon: Conrado Delay, DO;  Location: [facility] ORS;  Service: General;  Laterality: Left;   NISSEN FUNDOPLICATION     TONSILLECTOMY      Physical Exam   Triage Vital Signs: ED Triage Vitals  Encounter Vitals Group     BP 04/28/24 1730 (!) 106/56     Systolic BP Percentile --      Diastolic BP Percentile --      Pulse Rate 04/28/24 1730 (!) 104     Resp 04/28/24 1730 17     Temp 04/28/24 1730 97.8 F (36.6 C)     Temp Source 04/28/24 1730 Oral     SpO2 04/28/24 1730 94 %  Weight 04/28/24 1731 181 lb (82.1 kg)     Height 04/28/24 1731 5\' 6"  (1.676 m)     Head Circumference --      Peak Flow --      Pain Score 04/28/24 1731 6     Pain Loc --      Pain Education --      Exclude from Growth Chart --     Most recent vital signs: Vitals:   04/28/24 1730 04/28/24 2144  BP: (!) 106/56 139/82  Pulse: (!) 104 87  Resp: 17 19  Temp: 97.8 F (36.6 C) 98 F (36.7 C)  SpO2: 94% 95%    General: Awake, no distress.  CV:  Good peripheral perfusion.  Tachycardia heart rate 100.  Normal distal pulses Resp:  Normal effort.  Clear to auscultation bilaterally Abd:  No distention.  Soft nontender Other:  No lower extremity edema, no calf tenderness.  Dry oral mucosa.  Mild pharyngeal erythema.   ED Results / Procedures / Treatments   Labs (all labs  ordered are listed, but only abnormal results are displayed) Labs Reviewed  COMPREHENSIVE METABOLIC PANEL WITH GFR - Abnormal; Notable for the following components:      Result Value   Sodium 132 (*)    Glucose, Bld 128 (*)    Calcium 7.9 (*)    Albumin 3.2 (*)    Anion gap 3 (*)    All other components within normal limits  URINALYSIS, ROUTINE W REFLEX MICROSCOPIC - Abnormal; Notable for the following components:   Color, Urine YELLOW (*)    APPearance CLOUDY (*)    Ketones, ur 5 (*)    All other components within normal limits  CBC WITH DIFFERENTIAL/PLATELET - Abnormal; Notable for the following components:   Eosinophils Absolute 0.7 (*)    All other components within normal limits  CBG MONITORING, ED     EKG Interpreted by me Sinus tachycardia rate 104.  Normal axis and intervals.  Normal QRS ST segments and T waves.   RADIOLOGY Chest x-ray interpreted by me, unremarkable.  Radiology report reviewed   PROCEDURES:  Procedures   MEDICATIONS ORDERED IN ED: Medications  pantoprazole (PROTONIX) injection 40 mg (40 mg Intravenous Given 04/28/24 1814)  sodium chloride  0.9 % bolus 1,000 mL (1,000 mLs Intravenous New Bag/Given 04/28/24 1812)     IMPRESSION / MDM / ASSESSMENT AND PLAN / ED COURSE  I reviewed the triage vital signs and the nursing notes.  DDx: Dehydration, electrolyte derangement, anemia, UTI, pneumonia, gastritis  Patient's presentation is most consistent with acute presentation with potential threat to life or bodily function.  Patient presents with malaise, fatigue, poor oral intake, clinically appears to be getting dehydrated.  Will give IV fluids, Protonix, check labs.   ----------------------------------------- 10:20 PM on 04/28/2024 ----------------------------------------- Labs all reassuring.  Patient feels much better after IV fluid hydration.  Energy level improved.  Tolerating oral intake.  Stable for discharge      FINAL CLINICAL  IMPRESSION(S) / ED DIAGNOSES   Final diagnoses:  Dehydration     Rx / DC Orders   ED Discharge Orders     None        Note:  This document was prepared using Dragon voice recognition software and may include unintentional dictation errors.   Jacquie Maudlin, MD 04/28/24 2220

## 2024-04-28 NOTE — ED Notes (Signed)
 Pt Aox4 cleared and discharged. All discharge teachings given to pt. Pt verbalizes back understanding of teachings. Departed with daughter

## 2024-05-01 ENCOUNTER — Encounter: Payer: Self-pay | Admitting: Family Medicine

## 2024-05-01 ENCOUNTER — Inpatient Hospital Stay
Admission: EM | Admit: 2024-05-01 | Discharge: 2024-05-05 | DRG: 163 | Disposition: A | Discharge status: Home Health | Attending: Internal Medicine | Admitting: Internal Medicine

## 2024-05-01 ENCOUNTER — Emergency Department

## 2024-05-01 ENCOUNTER — Observation Stay

## 2024-05-01 ENCOUNTER — Other Ambulatory Visit: Payer: Self-pay

## 2024-05-01 DIAGNOSIS — Z803 Family history of malignant neoplasm of breast: Secondary | ICD-10-CM

## 2024-05-01 DIAGNOSIS — I82452 Acute embolism and thrombosis of left peroneal vein: Secondary | ICD-10-CM | POA: Diagnosis present

## 2024-05-01 DIAGNOSIS — R0609 Other forms of dyspnea: Secondary | ICD-10-CM

## 2024-05-01 DIAGNOSIS — Z888 Allergy status to other drugs, medicaments and biological substances status: Secondary | ICD-10-CM

## 2024-05-01 DIAGNOSIS — I82431 Acute embolism and thrombosis of right popliteal vein: Secondary | ICD-10-CM | POA: Diagnosis present

## 2024-05-01 DIAGNOSIS — I2699 Other pulmonary embolism without acute cor pulmonale: Secondary | ICD-10-CM | POA: Diagnosis not present

## 2024-05-01 DIAGNOSIS — I10 Essential (primary) hypertension: Secondary | ICD-10-CM | POA: Diagnosis present

## 2024-05-01 DIAGNOSIS — Z66 Do not resuscitate: Secondary | ICD-10-CM | POA: Diagnosis present

## 2024-05-01 DIAGNOSIS — K219 Gastro-esophageal reflux disease without esophagitis: Secondary | ICD-10-CM | POA: Diagnosis not present

## 2024-05-01 DIAGNOSIS — Z880 Allergy status to penicillin: Secondary | ICD-10-CM

## 2024-05-01 DIAGNOSIS — Z79899 Other long term (current) drug therapy: Secondary | ICD-10-CM

## 2024-05-01 DIAGNOSIS — Z882 Allergy status to sulfonamides status: Secondary | ICD-10-CM

## 2024-05-01 DIAGNOSIS — Z1152 Encounter for screening for COVID-19: Secondary | ICD-10-CM

## 2024-05-01 DIAGNOSIS — R0602 Shortness of breath: Secondary | ICD-10-CM

## 2024-05-01 DIAGNOSIS — I82433 Acute embolism and thrombosis of popliteal vein, bilateral: Secondary | ICD-10-CM

## 2024-05-01 DIAGNOSIS — Z8619 Personal history of other infectious and parasitic diseases: Secondary | ICD-10-CM

## 2024-05-01 DIAGNOSIS — Z881 Allergy status to other antibiotic agents status: Secondary | ICD-10-CM

## 2024-05-01 DIAGNOSIS — E860 Dehydration: Secondary | ICD-10-CM | POA: Diagnosis present

## 2024-05-01 DIAGNOSIS — Z90710 Acquired absence of both cervix and uterus: Secondary | ICD-10-CM

## 2024-05-01 DIAGNOSIS — R531 Weakness: Secondary | ICD-10-CM

## 2024-05-01 DIAGNOSIS — Z883 Allergy status to other anti-infective agents status: Secondary | ICD-10-CM

## 2024-05-01 DIAGNOSIS — J9601 Acute respiratory failure with hypoxia: Principal | ICD-10-CM | POA: Diagnosis present

## 2024-05-01 DIAGNOSIS — E7800 Pure hypercholesterolemia, unspecified: Secondary | ICD-10-CM | POA: Diagnosis present

## 2024-05-01 DIAGNOSIS — Z885 Allergy status to narcotic agent status: Secondary | ICD-10-CM

## 2024-05-01 LAB — CBC WITH DIFFERENTIAL/PLATELET
Abs Immature Granulocytes: 0.03 10*3/uL (ref 0.00–0.07)
Basophils Absolute: 0 10*3/uL (ref 0.0–0.1)
Basophils Relative: 1 %
Eosinophils Absolute: 0.5 10*3/uL (ref 0.0–0.5)
Eosinophils Relative: 6 %
HCT: 36.8 % (ref 36.0–46.0)
Hemoglobin: 12.8 g/dL (ref 12.0–15.0)
Immature Granulocytes: 0 %
Lymphocytes Relative: 13 %
Lymphs Abs: 0.9 10*3/uL (ref 0.7–4.0)
MCH: 32.2 pg (ref 26.0–34.0)
MCHC: 34.8 g/dL (ref 30.0–36.0)
MCV: 92.5 fL (ref 80.0–100.0)
Monocytes Absolute: 0.9 10*3/uL (ref 0.1–1.0)
Monocytes Relative: 13 %
Neutro Abs: 4.8 10*3/uL (ref 1.7–7.7)
Neutrophils Relative %: 67 %
Platelets: 162 10*3/uL (ref 150–400)
RBC: 3.98 MIL/uL (ref 3.87–5.11)
RDW: 13.2 % (ref 11.5–15.5)
WBC: 7.1 10*3/uL (ref 4.0–10.5)
nRBC: 0 % (ref 0.0–0.2)

## 2024-05-01 LAB — COMPREHENSIVE METABOLIC PANEL WITH GFR
ALT: 19 U/L (ref 0–44)
AST: 23 U/L (ref 15–41)
Albumin: 3 g/dL — ABNORMAL LOW (ref 3.5–5.0)
Alkaline Phosphatase: 76 U/L (ref 38–126)
Anion gap: 7 (ref 5–15)
BUN: 11 mg/dL (ref 8–23)
CO2: 25 mmol/L (ref 22–32)
Calcium: 8.2 mg/dL — ABNORMAL LOW (ref 8.9–10.3)
Chloride: 98 mmol/L (ref 98–111)
Creatinine, Ser: 0.91 mg/dL (ref 0.44–1.00)
GFR, Estimated: >60 mL/min (ref >60)
Glucose, Bld: 110 mg/dL — ABNORMAL HIGH (ref 70–99)
Potassium: 3.4 mmol/L — ABNORMAL LOW (ref 3.5–5.1)
Sodium: 130 mmol/L — ABNORMAL LOW (ref 135–145)
Total Bilirubin: 0.8 mg/dL (ref 0.0–1.2)
Total Protein: 6.9 g/dL (ref 6.5–8.1)

## 2024-05-01 LAB — RESP PANEL BY RT-PCR (RSV, FLU A&B, COVID)  RVPGX2
Influenza A by PCR: NEGATIVE
Influenza B by PCR: NEGATIVE
Resp Syncytial Virus by PCR: NEGATIVE
SARS Coronavirus 2 by RT PCR: NEGATIVE

## 2024-05-01 LAB — TSH: TSH: 2.946 u[IU]/mL (ref 0.350–4.500)

## 2024-05-01 LAB — URINALYSIS, W/ REFLEX TO CULTURE (INFECTION SUSPECTED)
Bilirubin Urine: NEGATIVE
Glucose, UA: NEGATIVE mg/dL
Hgb urine dipstick: NEGATIVE
Ketones, ur: 5 mg/dL — AB
Leukocytes,Ua: NEGATIVE
Nitrite: NEGATIVE
Protein, ur: NEGATIVE mg/dL
Specific Gravity, Urine: 1.031 — ABNORMAL HIGH (ref 1.005–1.030)
pH: 7 (ref 5.0–8.0)

## 2024-05-01 LAB — PROCALCITONIN: Procalcitonin: <0.1 ng/mL

## 2024-05-01 LAB — LACTIC ACID, PLASMA
Lactic Acid, Venous: 1.6 mmol/L (ref 0.5–1.9)
Lactic Acid, Venous: 1.1 mmol/L (ref 0.5–1.9)

## 2024-05-01 LAB — TROPONIN I (HIGH SENSITIVITY)
Troponin I (High Sensitivity): 12 ng/L (ref <18)
Troponin I (High Sensitivity): 12 ng/L (ref <18)

## 2024-05-01 LAB — MAGNESIUM: Magnesium: 1.9 mg/dL (ref 1.7–2.4)

## 2024-05-01 LAB — T4, FREE: Free T4: 1.15 ng/dL — ABNORMAL HIGH (ref 0.61–1.12)

## 2024-05-01 MED ORDER — DOXYCYCLINE HYCLATE 100 MG PO TABS
100.0000 mg | ORAL_TABLET | Freq: Once | ORAL | Status: AC
  Administered 2024-05-01: 100 mg via ORAL
  Filled 2024-05-01: qty 1

## 2024-05-01 MED ORDER — ONDANSETRON HCL 4 MG PO TABS: 4.0000 mg | ORAL_TABLET | Freq: Four times a day (QID) | ORAL | Status: DC | PRN

## 2024-05-01 MED ORDER — ACETAMINOPHEN 325 MG PO TABS
650.0000 mg | ORAL_TABLET | Freq: Four times a day (QID) | ORAL | Status: DC | PRN
  Administered 2024-05-02 – 2024-05-03 (×2): 650 mg via ORAL
  Filled 2024-05-01 (×2): qty 2

## 2024-05-01 MED ORDER — SODIUM CHLORIDE 0.9 % IV SOLN
1.0000 g | Freq: Once | INTRAVENOUS | Status: AC
  Administered 2024-05-01: 1 g via INTRAVENOUS
  Filled 2024-05-01: qty 10

## 2024-05-01 MED ORDER — ENOXAPARIN SODIUM 100 MG/ML IJ SOSY
1.0000 mg/kg | PREFILLED_SYRINGE | Freq: Two times a day (BID) | INTRAMUSCULAR | Status: DC
  Administered 2024-05-01 – 2024-05-02 (×4): 82.5 mg via SUBCUTANEOUS
  Filled 2024-05-01 (×9): qty 1

## 2024-05-01 MED ORDER — SODIUM CHLORIDE 0.9 % IV BOLUS
1000.0000 mL | Freq: Once | INTRAVENOUS | Status: AC
  Administered 2024-05-01: 1000 mL via INTRAVENOUS

## 2024-05-01 MED ORDER — ONDANSETRON HCL 4 MG/2ML IJ SOLN: 4.0000 mg | Freq: Four times a day (QID) | INTRAMUSCULAR | Status: DC | PRN

## 2024-05-01 MED ORDER — IOHEXOL 350 MG/ML SOLN
100.0000 mL | Freq: Once | INTRAVENOUS | Status: AC | PRN
  Administered 2024-05-01: 100 mL via INTRAVENOUS

## 2024-05-01 NOTE — Assessment & Plan Note (Signed)
BP stable. Continue home regimen

## 2024-05-01 NOTE — Assessment & Plan Note (Signed)
 Recent episode of shingles status post course of prednisone and acyclovir Resolved

## 2024-05-01 NOTE — ED Triage Notes (Signed)
 Pt was seen x2 days ago for weakness & dx with shingles. Pt states she is getting worse, can barely get out of bed alone. Unable to ambulate more than a few steps before having to take a break.  No other complaints at time of triage.    Cbg 111

## 2024-05-01 NOTE — Progress Notes (Signed)
 OT Cancellation Note  Patient Details Name: Rachel Harris MRN: [id number] DOB: 1935-05-14   Cancelled Treatment:    Reason Eval/Treat Not Completed: Patient not medically ready. Order received, chart reviewed. Per chart, chest CT revealing  acute occlusive and nonocclusive B pulmonary embolism with new O2 needs - per therapy protocol, contraindicated for exertional activity at this time. Will continue to follow and initiate services as pt medically appropriate to participate in therapy.    Gordan Latina, M.S. OTR/L  05/01/24, 9:47 AM  ascom [phone]

## 2024-05-01 NOTE — ED Provider Notes (Signed)
 [facility] Provider Note    Event Date/Time   First MD Initiated Contact with Patient 05/01/24 [phone]     (approximate)   History   Weakness   HPI  Rachel Harris is a 89 y.o. female   Past medical history of hypertension, hyperlipidemia, here with generalized weakness and shortness of breath worsening over the last several weeks.  She states that she continues to get worse after feeling generalized fatigue, whole body weakness, and feels quite short winded with minimal exertion.  She has a bloating abdominal sensation and has not made a bowel movement in 3 days.  She is passing flatus however.  She denies any chest pain.  No nausea vomiting diarrhea.  Poor p.o. intake.  No urinary symptoms.   External Medical Documents Reviewed: Hospital records show a visit within the last week for generalized weakness, unremarkable lab testing and urinalysis and was discharged after feeling better after normal saline bolus with a diagnosis of dehydration      Physical Exam   Triage Vital Signs: ED Triage Vitals  Encounter Vitals Group     BP 05/01/24 0547 124/66     Systolic BP Percentile --      Diastolic BP Percentile --      Pulse Rate 05/01/24 0547 89     Resp 05/01/24 0547 (!) 21     Temp 05/01/24 0547 98.3 F (36.8 C)     Temp Source 05/01/24 0547 Oral     SpO2 05/01/24 0547 92 %     Weight 05/01/24 0548 182 lb 15.7 oz (83 kg)     Height 05/01/24 0548 5\' 6"  (1.676 m)     Head Circumference --      Peak Flow --      Pain Score 05/01/24 0548 7     Pain Loc --      Pain Education --      Exclude from Growth Chart --     Most recent vital signs: Vitals:   05/01/24 0547  BP: 124/66  Pulse: 89  Resp: (!) 21  Temp: 98.3 F (36.8 C)  SpO2: 92%    General: Awake, no distress.  CV:  Good peripheral perfusion.  Resp:  Normal effort. Abd:  No distention.  Other:  Pleasant woman, whose vital signs are significant for mild tachypnea and an  oxygen saturation of 90% on room air.  She normalized with 2 L nasal cannula.  Her lung exam showed no focality or wheezing.  Her mucous membranes look slightly dry, mild dehydration.  She has a soft benign abdominal exam.  She is able to range all extremities with full active range of motion is sensate throughout with no dysarthria or facial asymmetry.   ED Results / Procedures / Treatments   Labs (all labs ordered are listed, but only abnormal results are displayed) Labs Reviewed  RESP PANEL BY RT-PCR (RSV, FLU A&B, COVID)  [id number]  LACTIC ACID, PLASMA  CBC WITH DIFFERENTIAL/PLATELET  COMPREHENSIVE METABOLIC PANEL WITH GFR  LACTIC ACID, PLASMA  URINALYSIS, W/ REFLEX TO CULTURE (INFECTION SUSPECTED)  TSH  T4, FREE  MAGNESIUM  TROPONIN I (HIGH SENSITIVITY)     I ordered and reviewed the above labs they are notable for cell counts are unremarkable.  EKG  ED ECG REPORT I, Buell Carmin, the attending physician, personally viewed and interpreted this ECG.   Date: 05/01/2024  EKG Time: 0546  Rate: 89  Rhythm: sinus  Axis: nl  Intervals:  nl  ST&T Change: no stemi    RADIOLOGY I independently reviewed and interpreted chest x-ray and I see no obvious focality pneumothorax I also reviewed radiologist's formal read.   PROCEDURES:  Critical Care performed: Yes, see critical care procedure note(s)  .Critical Care  Performed by: Buell Carmin, MD Authorized by: Buell Carmin, MD   Critical care provider statement:    Critical care time (minutes):  30   Critical care was time spent personally by me on the following activities:  Development of treatment plan with patient or surrogate, discussions with consultants, evaluation of patient's response to treatment, examination of patient, ordering and review of laboratory studies, ordering and review of radiographic studies, ordering and performing treatments and interventions, pulse oximetry, re-evaluation of patient's condition and review  of old charts    MEDICATIONS ORDERED IN ED: Medications  sodium chloride  0.9 % bolus 1,000 mL (1,000 mLs Intravenous New Bag/Given 05/01/24 0605)    IMPRESSION / MDM / ASSESSMENT AND PLAN / ED COURSE  I reviewed the triage vital signs and the nursing notes.                                Patient's presentation is most consistent with acute presentation with potential threat to life or bodily function.  Differential diagnosis includes, but is not limited to, hypoxemic respiratory failure, PE, ACS, electrolyte derangement or dehydration, urinary tract infection, respiratory infection, bowel obstruction, intra-abdominal infection   The patient is on the cardiac monitor to evaluate for evidence of arrhythmia and/or significant heart rate changes.  MDM:    Her chief complaint of generalized weakness worsening over the last several weeks along with a complaint of shortness of breath with minimal exertion and new hypoxemia is concerning for PE.  I am getting a CT angiogram of the chest to assess.  Unguinal bring the CT down through the abdomen and pelvis given her complaints of bloating abdominal pain constipation no BMs over the last 3 days to check for intra-abdominal infection or obstruction.  She denies any focal infectious symptoms like urinary frequency/dysuria, cough, congestion, fever or chills and so I will hold off on antibiotics until further evaluation comes up with a source.  She looks a little dry and dehydrated and has poor p.o. intake so I will give her a liter of saline.  Her weakness is global and she has no focal neurologic deficits so I doubt stroke  She is doing well on 2 L nasal cannula and will be admitted given her new oxygen requirement.         FINAL CLINICAL IMPRESSION(S) / ED DIAGNOSES   Final diagnoses:  Acute hypoxemic respiratory failure (HCC)  Dyspnea on exertion  Generalized weakness     Rx / DC Orders   ED Discharge Orders     None         Note:  This document was prepared using Dragon voice recognition software and may include unintentional dictation errors.    Buell Carmin, MD 05/01/24 [phone]

## 2024-05-01 NOTE — ED Notes (Signed)
 Urine sample requested from pt, pt states she can not urinate at this time. Pt agreed to use call bell to let staff know when she was able to urinate.

## 2024-05-01 NOTE — ED Provider Notes (Signed)
-----------------------------------------   8:56 AM on 05/01/2024 -----------------------------------------  I took over care on this patient from Dr. Margery Sheets.  CTA has resulted and shows bilateral PE, occlusive and nonocclusive, without right heart strain.  I have ordered Lovenox.  The patient is also receiving antibiotics for possible pneumonia.  Her vital signs are stable.  Given the extent of the PE as well as the patient's symptoms with severely decreased exercise tolerance, she will need admission for further management.  I consulted Dr. Daisey Dryer from the hospitalist service; based on our discussion he agrees to evaluate the patient for admission.   Lind Repine, MD 05/01/24 [phone]

## 2024-05-01 NOTE — H&P (Signed)
 History and Physical    Patient: Rachel Harris YNW:[id number] DOB: November 04, 1935 DOA: 05/01/2024 DOS: the patient was seen and examined on 05/01/2024 PCP: Yehuda Helms, MD  Patient coming from: Home  Chief Complaint:  Chief Complaint  Patient presents with   Weakness   HPI: Rachel Harris is a 89 y.o. female with medical history significant of GERD, hypertension, chronic estradiol use presenting with shortness of breath and PE.  Patient reports shortness of breath over multiple weeks.  Cannot fully specify how long this been going on.  Positive generalized increased work of breathing.  This is acutely worsened over the past 1 to 2 days.  No fevers or chills.  Minimal to mild cough with deep breathing.  Was recently evaluated in the outpatient setting for shingles.  Has completed course of steroids as well as antivirals with resolution of symptoms.  Mild abdominal pain.  No diarrhea or dysuria.  No focal hemiparesis or confusion.  No headache.  No reported alcohol or tobacco use.  Has been using estradiol chronically.  Denies any known history of PEs or blood clots in the past.  No reported recent extended trips. Presenting to the ER afebrile, hemodynamically stable.  Satting in low 90s on room air.  White count 7.1, hemoglobin 12.8, platelets 162, creatinine 0.9.  COVID flu and RSV negative.  CT of the chest showing moderate to large volume acute occlusive and nonocclusive PE extending from bilateral lobar pulmonary arteries bilaterally's.  No reported heart strain.  Noted groundglass opacities in bilateral lungs though nonspecific.  EKG normal sinus rhythm. Review of Systems: As mentioned in the history of present illness. All other systems reviewed and are negative. Past Medical History:  Diagnosis Date   Anxiety    Barrett esophagus    Cataract cortical, senile    Degenerative disc disease, cervical    Depression    Diverticulosis    Environmental allergies    Exostosis    Fibrocystic  breast disease    Fibrocystic breast disease    Gastritis    GERD (gastroesophageal reflux disease)    Hallux valgus    HH (hiatus hernia)    History of degenerative disc disease    History of shingles    Hypercholesterolemia    Hyperlipidemia    Hypertension    Internal hemorrhoids    Menopausal syndrome    Recurrent sinus infections    Vaginitis    Past Surgical History:  Procedure Laterality Date   ABDOMINAL HYSTERECTOMY     APPENDECTOMY     BREAST BIOPSY Left 2015   APOCRINE CYST   BREAST CYST ASPIRATION Left 2015   neg   BREAST SURGERY     biopsy   CATARACT EXTRACTION W/ INTRAOCULAR LENS IMPLANTW/ TRABECULECTOMY     CHOLECYSTECTOMY     COLONOSCOPY WITH PROPOFOL  N/A 08/25/2015   Procedure: COLONOSCOPY WITH PROPOFOL ;  Surgeon: Deveron Fly, MD;  Location: [facility] ENDOSCOPY;  Service: Endoscopy;  Laterality: N/A;   ESOPHAGOGASTRODUODENOSCOPY (EGD) WITH PROPOFOL  N/A 08/25/2015   Procedure: ESOPHAGOGASTRODUODENOSCOPY (EGD) WITH PROPOFOL ;  Surgeon: Deveron Fly, MD;  Location: [facility] ENDOSCOPY;  Service: Endoscopy;  Laterality: N/A;   ESOPHAGOGASTRODUODENOSCOPY (EGD) WITH PROPOFOL  N/A 02/20/2018   Procedure: ESOPHAGOGASTRODUODENOSCOPY (EGD) WITH PROPOFOL ;  Surgeon: Deveron Fly, MD;  Location: [facility] ENDOSCOPY;  Service: Endoscopy;  Laterality: N/A;   HERNIA REPAIR     LESION REMOVAL Left 04/19/2024   Procedure: EXCISION, LESION, NECK;  Surgeon: Conrado Delay, DO;  Location: [facility] ORS;  Service: General;  Laterality: Left;   NISSEN FUNDOPLICATION     TONSILLECTOMY     Social History:  reports that she has never smoked. She has never used smokeless tobacco. She reports that she does not drink alcohol and does not use drugs.  Allergies  Allergen Reactions   Amlodipine Swelling   Amoxicillin    Ceclor [Cefaclor]    Codeine    Erythromycin    Hydrochlorothiazide Other (See Comments)    dehydation   Levaquin [Levofloxacin In D5w]    Macrodantin [Nitrofurantoin  Macrocrystal]    Zithromax [Azithromycin]    Sulfa Antibiotics Rash    Family History  Problem Relation Age of Onset   Breast cancer Sister 31        2 sisters   Breast cancer Paternal Aunt    Breast cancer Other     Prior to Admission medications   Medication Sig Start Date End Date Taking? Authorizing Provider  acetaminophen (TYLENOL) 650 MG CR tablet Take 650-1,300 mg by mouth every 8 (eight) hours as needed for pain.    [provider]  diltiazem (CARDIZEM CD) 120 MG 24 hr capsule Take 120 mg by mouth in the morning.    [provider]  estradiol (ESTRACE) 1 MG tablet Take 1 mg by mouth in the morning.    [provider]  ipratropium (ATROVENT) 0.03 % nasal spray Place 2 sprays into both nostrils in the morning.    [provider]  omeprazole (PRILOSEC) 20 MG capsule Take 20 mg by mouth in the morning.    [provider]  traMADol  (ULTRAM ) 50 MG tablet Take 1 tablet (50 mg total) by mouth as needed for severe pain (pain score 7-10). 04/19/24   Conrado Delay, DO    Physical Exam: Vitals:   05/01/24 0700 05/01/24 0730 05/01/24 0809 05/01/24 0830  BP: (!) 146/86 139/77  135/82  Pulse: 83 84  81  Resp:    19  Temp:   98.2 F (36.8 C)   TempSrc:   Oral   SpO2: 96% 96%  95%  Weight:      Height:       Physical Exam Constitutional:      Appearance: She is normal weight.  HENT:     Head: Normocephalic and atraumatic.     Nose: Nose normal.     Mouth/Throat:     Mouth: Mucous membranes are moist.  Eyes:     Pupils: Pupils are equal, round, and reactive to light.  Cardiovascular:     Rate and Rhythm: Normal rate and regular rhythm.  Pulmonary:     Effort: Pulmonary effort is normal.  Abdominal:     General: Bowel sounds are normal.  Musculoskeletal:        General: Normal range of motion.  Skin:    General: Skin is warm.  Neurological:     General: No focal deficit present.  Psychiatric:        Mood and Affect: Mood  normal.     Data Reviewed:  There are no new results to review at this time.  CT ABDOMEN PELVIS W CONTRAST Addendum: ADDENDUM REPORT: 05/01/2024 08:49   ADDENDUM:  Critical Value/emergent results were called by telephone at the time  of interpretation on 05/01/2024 at 8:35 am to Dr. Lind Repine,  who verbally acknowledged these results.   Electronically Signed    By: Beula Brunswick M.D.    On: 05/01/2024 08:49 Narrative: CLINICAL DATA:  Pulmonary embolism (  PE) suspected, high prob; Bowel obstruction suspected LLQ abdominal pain RLQ abdominal pain.  EXAM: CT ANGIOGRAPHY CHEST  CT ABDOMEN AND PELVIS WITH CONTRAST  TECHNIQUE: Multidetector CT imaging of the chest was performed using the standard protocol during bolus administration of intravenous contrast. Multiplanar CT image reconstructions and MIPs were obtained to evaluate the vascular anatomy. Multidetector CT imaging of the abdomen and pelvis was performed using the standard protocol during bolus administration of intravenous contrast.  RADIATION DOSE REDUCTION: This exam was performed according to the departmental dose-optimization program which includes automated exposure control, adjustment of the mA and/or kV according to patient size and/or use of iterative reconstruction technique.  CONTRAST:  OMNIPAQUE  IOHEXOL  350 MG/ML SOLN  COMPARISON:  CT angiography chest from 05/11/2021 and CT scan abdomen and pelvis from 01/24/2019.  FINDINGS: CTA CHEST FINDINGS  Cardiovascular: There is moderate-to-large volume acute occlusive and nonocclusive embolism extending from bilateral lobar pulmonary artery branches up to the subsegmental pulmonary artery branches throughout bilateral lungs. No right heart strain. No frank lung infarction.  Normal cardiac size. No pericardial effusion. No aortic aneurysm. There are coronary artery calcifications, in keeping with coronary artery disease. There are also mild  peripheral atherosclerotic vascular calcifications of thoracic aorta and its major branches.  Mediastinum/Nodes: Visualized thyroid  gland appears grossly unremarkable. No solid / cystic mediastinal masses. The esophagus is nondistended precluding optimal assessment. There are few mildly prominent mediastinal and hilar lymph nodes, which do not meet the size criteria for lymphadenopathy and appear mildly decreased in size since the prior study, favoring benign etiology. No axillary lymphadenopathy by size criteria.  Lungs/Pleura: The central tracheo-bronchial tree is patent. There are diffuse patchy ground-glass opacities scattered throughout bilateral lungs, which is nonspecific. No mass or dense consolidation. No pleural effusion or pneumothorax. No suspicious lung nodules.  Musculoskeletal: The visualized soft tissues of the chest wall are grossly unremarkable. No suspicious osseous lesions. There are mild multilevel degenerative changes in the visualized spine.  Review of the MIP images confirms the above findings.  CT ABDOMEN and PELVIS FINDINGS  Hepatobiliary: The liver is normal in size. Non-cirrhotic configuration. No suspicious mass. No intrahepatic or extrahepatic bile duct dilation. Gallbladder is surgically absent.  Pancreas: Unremarkable. No pancreatic ductal dilatation or surrounding inflammatory changes.  Spleen: Within normal limits. No focal lesion.  Adrenals/Urinary Tract: Adrenal glands are unremarkable. No suspicious renal mass. There is a partially exophytic 6.1 x 6.4 cm cyst arising from the right kidney interpolar region. There are additional smaller sinus cysts in the left kidney. No nephroureterolithiasis or obstructive uropathy. Unremarkable urinary bladder.  Stomach/Bowel: There is a tiny sliding hiatal hernia. No disproportionate dilation of the small or large bowel loops. No evidence of abnormal bowel wall thickening or inflammatory changes. The  appendix was not visualized; however there is no acute inflammatory process in the right lower quadrant. There are multiple diverticula mainly in the sigmoid colon, without imaging signs of diverticulitis.  Vascular/Lymphatic: No ascites or pneumoperitoneum. No abdominal or pelvic lymphadenopathy, by size criteria. No aneurysmal dilation of the major abdominal arteries. There are mild peripheral atherosclerotic vascular calcifications of the aorta and its major branches.  Reproductive: The uterus is surgically absent. There is a fluid attenuation 1.9 x 2.3 cm structure in the right adnexa, incompletely characterized on the current exam but favored to represent a senescent ovarian cyst. No significant interval change since the prior study. No other large adnexal mass.  Other: Redemonstration of pelvic floor prolapse with rectocele, grossly similar to the  prior study. There is a small fat containing umbilical hernia. The soft tissues and abdominal wall are otherwise unremarkable.  Musculoskeletal: No suspicious osseous lesions. There are mild - moderate multilevel degenerative changes in the visualized spine.  Review of the MIP images confirms the above findings.  IMPRESSION: 1. There is moderate-to-large volume acute occlusive and nonocclusive pulmonary embolism extending from bilateral lobar pulmonary artery branches up to the subsegmental pulmonary artery branches throughout bilateral lungs. No right heart strain. No lung infarction. 2. No acute inflammatory process identified within the abdomen or pelvis. No bowel obstruction. 3. There are diffuse patchy ground-glass opacities scattered throughout bilateral lungs, which is nonspecific. Findings may represent sequela of infection or inflammation. Correlate clinically. 4. Multiple other nonacute observations, as described above.  Aortic Atherosclerosis (ICD10-I70.0).  Electronically Signed: By: Beula Brunswick M.D. On:  05/01/2024 08:30 CT Angio Chest PE W/Cm &/Or Wo Cm Addendum: ADDENDUM REPORT: 05/01/2024 08:49   ADDENDUM:  Critical Value/emergent results were called by telephone at the time  of interpretation on 05/01/2024 at 8:35 am to Dr. Lind Repine,  who verbally acknowledged these results.   Electronically Signed    By: Beula Brunswick M.D.    On: 05/01/2024 08:49 Narrative: CLINICAL DATA:  Pulmonary embolism (PE) suspected, high prob; Bowel obstruction suspected LLQ abdominal pain RLQ abdominal pain.  EXAM: CT ANGIOGRAPHY CHEST  CT ABDOMEN AND PELVIS WITH CONTRAST  TECHNIQUE: Multidetector CT imaging of the chest was performed using the standard protocol during bolus administration of intravenous contrast. Multiplanar CT image reconstructions and MIPs were obtained to evaluate the vascular anatomy. Multidetector CT imaging of the abdomen and pelvis was performed using the standard protocol during bolus administration of intravenous contrast.  RADIATION DOSE REDUCTION: This exam was performed according to the departmental dose-optimization program which includes automated exposure control, adjustment of the mA and/or kV according to patient size and/or use of iterative reconstruction technique.  CONTRAST:  OMNIPAQUE  IOHEXOL  350 MG/ML SOLN  COMPARISON:  CT angiography chest from 05/11/2021 and CT scan abdomen and pelvis from 01/24/2019.  FINDINGS: CTA CHEST FINDINGS  Cardiovascular: There is moderate-to-large volume acute occlusive and nonocclusive embolism extending from bilateral lobar pulmonary artery branches up to the subsegmental pulmonary artery branches throughout bilateral lungs. No right heart strain. No frank lung infarction.  Normal cardiac size. No pericardial effusion. No aortic aneurysm. There are coronary artery calcifications, in keeping with coronary artery disease. There are also mild peripheral atherosclerotic vascular calcifications of thoracic  aorta and its major branches.  Mediastinum/Nodes: Visualized thyroid  gland appears grossly unremarkable. No solid / cystic mediastinal masses. The esophagus is nondistended precluding optimal assessment. There are few mildly prominent mediastinal and hilar lymph nodes, which do not meet the size criteria for lymphadenopathy and appear mildly decreased in size since the prior study, favoring benign etiology. No axillary lymphadenopathy by size criteria.  Lungs/Pleura: The central tracheo-bronchial tree is patent. There are diffuse patchy ground-glass opacities scattered throughout bilateral lungs, which is nonspecific. No mass or dense consolidation. No pleural effusion or pneumothorax. No suspicious lung nodules.  Musculoskeletal: The visualized soft tissues of the chest wall are grossly unremarkable. No suspicious osseous lesions. There are mild multilevel degenerative changes in the visualized spine.  Review of the MIP images confirms the above findings.  CT ABDOMEN and PELVIS FINDINGS  Hepatobiliary: The liver is normal in size. Non-cirrhotic configuration. No suspicious mass. No intrahepatic or extrahepatic bile duct dilation. Gallbladder is surgically absent.  Pancreas: Unremarkable. No pancreatic ductal dilatation or  surrounding inflammatory changes.  Spleen: Within normal limits. No focal lesion.  Adrenals/Urinary Tract: Adrenal glands are unremarkable. No suspicious renal mass. There is a partially exophytic 6.1 x 6.4 cm cyst arising from the right kidney interpolar region. There are additional smaller sinus cysts in the left kidney. No nephroureterolithiasis or obstructive uropathy. Unremarkable urinary bladder.  Stomach/Bowel: There is a tiny sliding hiatal hernia. No disproportionate dilation of the small or large bowel loops. No evidence of abnormal bowel wall thickening or inflammatory changes. The appendix was not visualized; however there is no  acute inflammatory process in the right lower quadrant. There are multiple diverticula mainly in the sigmoid colon, without imaging signs of diverticulitis.  Vascular/Lymphatic: No ascites or pneumoperitoneum. No abdominal or pelvic lymphadenopathy, by size criteria. No aneurysmal dilation of the major abdominal arteries. There are mild peripheral atherosclerotic vascular calcifications of the aorta and its major branches.  Reproductive: The uterus is surgically absent. There is a fluid attenuation 1.9 x 2.3 cm structure in the right adnexa, incompletely characterized on the current exam but favored to represent a senescent ovarian cyst. No significant interval change since the prior study. No other large adnexal mass.  Other: Redemonstration of pelvic floor prolapse with rectocele, grossly similar to the prior study. There is a small fat containing umbilical hernia. The soft tissues and abdominal wall are otherwise unremarkable.  Musculoskeletal: No suspicious osseous lesions. There are mild - moderate multilevel degenerative changes in the visualized spine.  Review of the MIP images confirms the above findings.  IMPRESSION: 1. There is moderate-to-large volume acute occlusive and nonocclusive pulmonary embolism extending from bilateral lobar pulmonary artery branches up to the subsegmental pulmonary artery branches throughout bilateral lungs. No right heart strain. No lung infarction. 2. No acute inflammatory process identified within the abdomen or pelvis. No bowel obstruction. 3. There are diffuse patchy ground-glass opacities scattered throughout bilateral lungs, which is nonspecific. Findings may represent sequela of infection or inflammation. Correlate clinically. 4. Multiple other nonacute observations, as described above.  Aortic Atherosclerosis (ICD10-I70.0).  Electronically Signed: By: Beula Brunswick M.D. On: 05/01/2024 08:30 DG Chest 1 View CLINICAL DATA:   Infection workup.  Weakness.  EXAM: CHEST  1 VIEW  COMPARISON:  04/28/2024  FINDINGS: Heart size appears normal. Asymmetric elevation of right hemidiaphragm. Right lower lobe bronchial wall thickening is identified with new peribronchovascular opacities. No signs of pleural effusion or interstitial edema. Visualized osseous structures appear unremarkable.  IMPRESSION: Right lower lobe bronchial wall thickening with new peribronchovascular opacities compatible with bronchitis and possible developing pneumonia.  Electronically Signed   By: Kimberley Penman M.D.   On: 05/01/2024 06:44  Lab Results  Component Value Date   WBC 7.1 05/01/2024   HGB 12.8 05/01/2024   HCT 36.8 05/01/2024   MCV 92.5 05/01/2024   PLT 162 05/01/2024   Last metabolic panel Lab Results  Component Value Date   GLUCOSE 110 (H) 05/01/2024   NA 130 (L) 05/01/2024   K 3.4 (L) 05/01/2024   CL 98 05/01/2024   CO2 25 05/01/2024   BUN 11 05/01/2024   CREATININE 0.91 05/01/2024   GFRNONAA >60 05/01/2024   CALCIUM 8.2 (L) 05/01/2024   PROT 6.9 05/01/2024   ALBUMIN 3.0 (L) 05/01/2024   BILITOT 0.8 05/01/2024   ALKPHOS 76 05/01/2024   AST 23 05/01/2024   ALT 19 05/01/2024   ANIONGAP 7 05/01/2024    Assessment and Plan: * Pulmonary embolism (HCC) Shortness of breath Acute shortness of breath with pleuritic chest  pain over multiple days CT of the chest noted for moderate-to-large volume acute occlusive and nonocclusive pulmonary embolism extending from bilateral lobar pulmonary artery branches up to the subsegmental pulmonary artery branches throughout bilateral lungs without heart strain Started on treatment dose Lovenox in the ER-will continue  Lower extremity ultrasound x 1 Hold offending agents including estradiol Noted groundglass changes in the lungs bilaterally with concern for?  Infection Status post IV Rocephin and azithromycin in the ER White count within normal limits Procalcitonin  pending to correlate Minimal hypoxia at present Defer continuation of antibiotics for now Supplemental oxygen as needed Pain control Monitor  History of shingles Recent episode of shingles status post course of prednisone and acyclovir Resolved  Hypertension BP stable Continue home regimen  GERD (gastroesophageal reflux disease) Continue PPI      Advance Care Planning:   Code Status: Limited: Do not attempt resuscitation (DNR) -DNR-LIMITED -Do Not Intubate/DNI    Consults: None   Family Communication: Family at the bedside   Severity of Illness: The appropriate patient status for this patient is OBSERVATION. Observation status is judged to be reasonable and necessary in order to provide the required intensity of service to ensure the patient's safety. The patient's presenting symptoms, physical exam findings, and initial radiographic and laboratory data in the context of their medical condition is felt to place them at decreased risk for further clinical deterioration. Furthermore, it is anticipated that the patient will be medically stable for discharge from the hospital within 2 midnights of admission.   Author: Corrinne Din, MD 05/01/2024 9:39 AM  For on call review [email or web address].

## 2024-05-01 NOTE — Consult Note (Signed)
 Hospital Consult    Reason for Consult:  Bilateral Pulmonary embolisms with bilateral lower extremity DVT's Requesting Physician:  Dr Pablo Boards MD  MRN #:  [id number]  History of Present Illness: This is a 89 y.o. female with medical history significant of GERD, hypertension, chronic estradiol use presenting with shortness of breath and PE.  Patient underwent ultrasound which shows partially occlusive thrombus in the right popliteal vein and right peroneal vein with a near occlusive thrombus in the left peroneal vein.  Patient reports shortness of breath over multiple weeks.  Cannot fully specify how long this been going on.  Positive generalized increased work of breathing.  This is acutely worsened over the past 1 to 2 days.  No fevers or chills.  Minimal to mild cough with deep breathing.  Was recently evaluated in the outpatient setting for shingles.  Has completed course of steroids as well as antivirals with resolution of symptoms.  Mild abdominal pain.  No diarrhea or dysuria.  No focal hemiparesis or confusion.  No headache.  No reported alcohol or tobacco use.  Has been using estradiol chronically.  Denies any known history of PEs or blood clots in the past.  No reported recent extended trips.  Vascular surgery consulted to evaluate.  Past Medical History:  Diagnosis Date   Anxiety    Barrett esophagus    Cataract cortical, senile    Degenerative disc disease, cervical    Depression    Diverticulosis    Environmental allergies    Exostosis    Fibrocystic breast disease    Fibrocystic breast disease    Gastritis    GERD (gastroesophageal reflux disease)    Hallux valgus    HH (hiatus hernia)    History of degenerative disc disease    History of shingles    Hypercholesterolemia    Hyperlipidemia    Hypertension    Internal hemorrhoids    Menopausal syndrome    Recurrent sinus infections    Vaginitis     Past Surgical History:  Procedure Laterality Date   ABDOMINAL  HYSTERECTOMY     APPENDECTOMY     BREAST BIOPSY Left 2015   APOCRINE CYST   BREAST CYST ASPIRATION Left 2015   neg   BREAST SURGERY     biopsy   CATARACT EXTRACTION W/ INTRAOCULAR LENS IMPLANTW/ TRABECULECTOMY     CHOLECYSTECTOMY     COLONOSCOPY WITH PROPOFOL  N/A 08/25/2015   Procedure: COLONOSCOPY WITH PROPOFOL ;  Surgeon: Deveron Fly, MD;  Location: [facility] ENDOSCOPY;  Service: Endoscopy;  Laterality: N/A;   ESOPHAGOGASTRODUODENOSCOPY (EGD) WITH PROPOFOL  N/A 08/25/2015   Procedure: ESOPHAGOGASTRODUODENOSCOPY (EGD) WITH PROPOFOL ;  Surgeon: Deveron Fly, MD;  Location: [facility] ENDOSCOPY;  Service: Endoscopy;  Laterality: N/A;   ESOPHAGOGASTRODUODENOSCOPY (EGD) WITH PROPOFOL  N/A 02/20/2018   Procedure: ESOPHAGOGASTRODUODENOSCOPY (EGD) WITH PROPOFOL ;  Surgeon: Deveron Fly, MD;  Location: [facility] ENDOSCOPY;  Service: Endoscopy;  Laterality: N/A;   HERNIA REPAIR     LESION REMOVAL Left 04/19/2024   Procedure: EXCISION, LESION, NECK;  Surgeon: Conrado Delay, DO;  Location: [facility] ORS;  Service: General;  Laterality: Left;   NISSEN FUNDOPLICATION     TONSILLECTOMY      Allergies  Allergen Reactions   Ciprofloxacin Rash   Amlodipine Swelling   Amoxicillin    Ceclor [Cefaclor]    Codeine    Erythromycin    Levaquin [Levofloxacin In D5w]    Macrodantin [Nitrofurantoin Macrocrystal]    Zithromax [Azithromycin]    Hydrochlorothiazide Rash  dehydation   Sulfa Antibiotics Rash    Prior to Admission medications   Medication Sig Start Date End Date Taking? Authorizing Provider  acetaminophen  (TYLENOL ) 650 MG CR tablet Take 650-1,300 mg by mouth every 8 (eight) hours as needed for pain.   Yes [provider]  diltiazem (CARDIZEM CD) 120 MG 24 hr capsule Take 120 mg by mouth in the morning.   Yes [provider]  escitalopram (LEXAPRO) 10 MG tablet Take 10 mg by mouth daily. 04/24/24 07/23/24 Yes [provider]  estradiol (ESTRACE) 1 MG tablet Take 1 mg by  mouth in the morning.   Yes [provider]  hydrOXYzine (ATARAX) 25 MG tablet Take 25 mg by mouth 3 (three) times daily as needed for itching. 04/24/24 05/04/24 Yes [provider]  ipratropium (ATROVENT) 0.03 % nasal spray Place 2 sprays into both nostrils in the morning.   Yes [provider]  omeprazole (PRILOSEC) 20 MG capsule Take 20 mg by mouth in the morning.   Yes [provider]  traMADol  (ULTRAM ) 50 MG tablet Take 1 tablet (50 mg total) by mouth as needed for severe pain (pain score 7-10). 04/19/24  Yes Sakai, Isami, DO  valACYclovir (VALTREX) 1000 MG tablet Take 1,000 mg by mouth 3 (three) times daily. 04/13/24  Yes [provider]    Social History   Socioeconomic History   Marital status: Widowed    Spouse name: Not on file   Number of children: 2   Years of education: Not on file   Highest education level: Not on file  Occupational History   Not on file  Tobacco Use   Smoking status: Never   Smokeless tobacco: Never  Substance and Sexual Activity   Alcohol use: No   Drug use: No   Sexual activity: Not on file  Other Topics Concern   Not on file  Social History Narrative   She lives alone.    Social Drivers of Corporate investment banker Strain: Low Risk  (04/24/2024)   Received from [facility]   Overall Financial Resource Strain (CARDIA)    Difficulty of Paying Living Expenses: Not hard at all  Food Insecurity: No Food Insecurity (05/01/2024)   Hunger Vital Sign    Worried About Running Out of Food in the Last Year: Never true    Ran Out of Food in the Last Year: Never true  Transportation Needs: No Transportation Needs (05/01/2024)   PRAPARE - Administrator, Civil Service (Medical): No    Lack of Transportation (Non-Medical): No  Physical Activity: Not on file  Stress: Not on file  Social Connections: Moderately Integrated (05/01/2024)   Social Connection and Isolation Panel [NHANES]     Frequency of Communication with Friends and Family: More than three times a week    Frequency of Social Gatherings with Friends and Family: More than three times a week    Attends Religious Services: More than 4 times per year    Active Member of [employer] or Organizations: Yes    Attends Banker Meetings: More than 4 times per year    Marital Status: Widowed  Intimate Partner Violence: Not At Risk (05/01/2024)   Humiliation, Afraid, Rape, and Kick questionnaire    Fear of Current or Ex-Partner: No    Emotionally Abused: No    Physically Abused: No    Sexually Abused: No     Family History  Problem Relation Age of Onset  Breast cancer Sister 29        2 sisters   Breast cancer Paternal Aunt    Breast cancer Other     ROS: Otherwise negative unless mentioned in HPI  Physical Examination  Vitals:   05/01/24 1430 05/01/24 1525  BP: 138/72 138/76  Pulse: 82 93  Resp: (!) 21 (!) 21  Temp:    SpO2: 95% 95%   Body mass index is 29.53 kg/m.  General:  WDWN in NAD Gait: Not observed HENT: WNL, normocephalic Pulmonary: normal non-labored breathing, without Rales, rhonchi,  wheezing Cardiac: regular, tachycardia 110, without  Murmurs, rubs or gallops; without carotid bruits Abdomen: Positive bowel sounds throughout, soft, NT/ND, no masses Skin: without rashes Vascular Exam/Pulses: Palpable pulses throughout upper and lower extremities.  All extremities warm to touch. Extremities: without ischemic changes, without Gangrene , without cellulitis; without open wounds;  Musculoskeletal: no muscle wasting or atrophy  Neurologic: A&O X 3;  No focal weakness or paresthesias are detected; speech is fluent/normal Psychiatric:  The pt has Normal affect. Lymph:  Unremarkable  CBC    Component Value Date/Time   WBC 7.1 05/01/2024 0557   RBC 3.98 05/01/2024 0557   HGB 12.8 05/01/2024 0557   HCT 36.8 05/01/2024 0557   PLT 162 05/01/2024 0557   MCV 92.5 05/01/2024 0557   MCH  32.2 05/01/2024 0557   MCHC 34.8 05/01/2024 0557   RDW 13.2 05/01/2024 0557   LYMPHSABS 0.9 05/01/2024 0557   MONOABS 0.9 05/01/2024 0557   EOSABS 0.5 05/01/2024 0557   BASOSABS 0.0 05/01/2024 0557    BMET    Component Value Date/Time   NA 130 (L) 05/01/2024 0557   K 3.4 (L) 05/01/2024 0557   CL 98 05/01/2024 0557   CO2 25 05/01/2024 0557   GLUCOSE 110 (H) 05/01/2024 0557   BUN 11 05/01/2024 0557   CREATININE 0.91 05/01/2024 0557   CALCIUM 8.2 (L) 05/01/2024 0557   GFRNONAA >60 05/01/2024 0557   GFRAA >60 06/04/2020 0458    COAGS: No results found for: "INR", "PROTIME"   Non-Invasive Vascular Imaging:   EXAM: CT ANGIOGRAPHY CHEST   CT ABDOMEN AND PELVIS WITH CONTRAST   TECHNIQUE: Multidetector CT imaging of the chest was performed using the standard protocol during bolus administration of intravenous contrast. Multiplanar CT image reconstructions and MIPs were obtained to evaluate the vascular anatomy. Multidetector CT imaging of the abdomen and pelvis was performed using the standard protocol during bolus administration of intravenous contrast.   RADIATION DOSE REDUCTION: This exam was performed according to the departmental dose-optimization program which includes automated exposure control, adjustment of the mA and/or kV according to patient size and/or use of iterative reconstruction technique.   CONTRAST:  OMNIPAQUE  IOHEXOL  350 MG/ML SOLN   COMPARISON:  CT angiography chest from 05/11/2021 and CT scan abdomen and pelvis from 01/24/2019.   FINDINGS: CTA CHEST FINDINGS   Cardiovascular: There is moderate-to-large volume acute occlusive and nonocclusive embolism extending from bilateral lobar pulmonary artery branches up to the subsegmental pulmonary artery branches throughout bilateral lungs. No right heart strain. No frank lung infarction.   Normal cardiac size. No pericardial effusion. No aortic aneurysm. There are coronary artery  calcifications, in keeping with coronary artery disease. There are also mild peripheral atherosclerotic vascular calcifications of thoracic aorta and its major branches.   Mediastinum/Nodes: Visualized thyroid  gland appears grossly unremarkable. No solid / cystic mediastinal masses. The esophagus is nondistended precluding optimal assessment. There are few mildly prominent mediastinal and  hilar lymph nodes, which do not meet the size criteria for lymphadenopathy and appear mildly decreased in size since the prior study, favoring benign etiology. No axillary lymphadenopathy by size criteria.   Lungs/Pleura: The central tracheo-bronchial tree is patent. There are diffuse patchy ground-glass opacities scattered throughout bilateral lungs, which is nonspecific. No mass or dense consolidation. No pleural effusion or pneumothorax. No suspicious lung nodules.   Musculoskeletal: The visualized soft tissues of the chest wall are grossly unremarkable. No suspicious osseous lesions. There are mild multilevel degenerative changes in the visualized spine.   Review of the MIP images confirms the above findings.   CT ABDOMEN and PELVIS FINDINGS   Hepatobiliary: The liver is normal in size. Non-cirrhotic configuration. No suspicious mass. No intrahepatic or extrahepatic bile duct dilation. Gallbladder is surgically absent.   Pancreas: Unremarkable. No pancreatic ductal dilatation or surrounding inflammatory changes.   Spleen: Within normal limits. No focal lesion.   Adrenals/Urinary Tract: Adrenal glands are unremarkable. No suspicious renal mass. There is a partially exophytic 6.1 x 6.4 cm cyst arising from the right kidney interpolar region. There are additional smaller sinus cysts in the left kidney. No nephroureterolithiasis or obstructive uropathy. Unremarkable urinary bladder.   Stomach/Bowel: There is a tiny sliding hiatal hernia. No disproportionate dilation of the small or large  bowel loops. No evidence of abnormal bowel wall thickening or inflammatory changes. The appendix was not visualized; however there is no acute inflammatory process in the right lower quadrant. There are multiple diverticula mainly in the sigmoid colon, without imaging signs of diverticulitis.   Vascular/Lymphatic: No ascites or pneumoperitoneum. No abdominal or pelvic lymphadenopathy, by size criteria. No aneurysmal dilation of the major abdominal arteries. There are mild peripheral atherosclerotic vascular calcifications of the aorta and its major branches.   Reproductive: The uterus is surgically absent. There is a fluid attenuation 1.9 x 2.3 cm structure in the right adnexa, incompletely characterized on the current exam but favored to represent a senescent ovarian cyst. No significant interval change since the prior study. No other large adnexal mass.   Other: Redemonstration of pelvic floor prolapse with rectocele, grossly similar to the prior study. There is a small fat containing umbilical hernia. The soft tissues and abdominal wall are otherwise unremarkable.   Musculoskeletal: No suspicious osseous lesions. There are mild - moderate multilevel degenerative changes in the visualized spine.   Review of the MIP images confirms the above findings.   IMPRESSION: 1. There is moderate-to-large volume acute occlusive and nonocclusive pulmonary embolism extending from bilateral lobar pulmonary artery branches up to the subsegmental pulmonary artery branches throughout bilateral lungs. No right heart strain. No lung infarction. 2. No acute inflammatory process identified within the abdomen or pelvis. No bowel obstruction. 3. There are diffuse patchy ground-glass opacities scattered throughout bilateral lungs, which is nonspecific. Findings may represent sequela of infection or inflammation. Correlate clinically. 4. Multiple other nonacute observations, as described  above.   EXAM: BILATERAL LOWER EXTREMITY VENOUS DOPPLER ULTRASOUND   TECHNIQUE: Gray-scale sonography with compression, as well as color and duplex ultrasound, were performed to evaluate the deep venous system(s) from the level of the common femoral vein through the popliteal and proximal calf veins.   COMPARISON:  CT PA performed May 01, 2024   FINDINGS: VENOUS   Right lower extremity: Right common femoral and femoral veins are patent. The right popliteal vein is partially compressible. A peroneal vein is also identified on the right which is partially compressible.   Left lower extremity:  Left common femoral, femoral, popliteal veins are patent. The left peroneal vein is non compressible.   IMPRESSION: 1. Partially occlusive thrombus is favored within the right popliteal vein. A right peroneal vein thrombus is also favored. 2. Near occlusive thrombus is favored within a left peroneal vein.    Statin:  No. Beta Blocker:  No. Aspirin:  No. ACEI:  No. ARB:  No. CCB use:  Yes Other antiplatelets/anticoagulants:  No.    ASSESSMENT/PLAN: This is a 89 y.o. female who presents to [facility] emergency department for worsening shortness of breath.  Upon workup she was found to have bilateral pulmonary embolisms as well as bilateral lower nonocclusive deep vein thrombosis.  She continues to have some labored breathing at rest and requires nasal cannula oxygen at 2 to 3 L.  Therefore vascular surgery recommends a pulmonary thrombectomy.  Vascular surgery plans on taking the patient to the vascular lab on 05/03/2024 for a pulmonary thrombectomy.  I had a long detailed discussion at the bedside yesterday afternoon with the patient and her daughters.  We discussed in detail the procedure, benefits, risk, and complications.  They all verbalized their understanding and wished to proceed to soon as possible.  I answered all their questions.  Patient was made n.p.o. after midnight tonight for  procedure tomorrow.   -I discussed the case in detail with Dr. Devon Fogo MD and he agrees with the plan.   Annamaria [facility] 05/01/2024 4:06 PM

## 2024-05-01 NOTE — TOC CM/SW Note (Signed)
 5/7:  TOC acknowledges consult for SNF.HH/DME.  PT/OT eval pending.

## 2024-05-01 NOTE — Assessment & Plan Note (Signed)
 Continue PPI ?

## 2024-05-01 NOTE — H&P (View-Only) (Signed)
 Hospital Consult    Reason for Consult:  Bilateral Pulmonary embolisms with bilateral lower extremity DVT's Requesting Physician:  Dr Pablo Boards MD  MRN #:  [id number]  History of Present Illness: This is a 89 y.o. female with medical history significant of GERD, hypertension, chronic estradiol use presenting with shortness of breath and PE.  Patient underwent ultrasound which shows partially occlusive thrombus in the right popliteal vein and right peroneal vein with a near occlusive thrombus in the left peroneal vein.  Patient reports shortness of breath over multiple weeks.  Cannot fully specify how long this been going on.  Positive generalized increased work of breathing.  This is acutely worsened over the past 1 to 2 days.  No fevers or chills.  Minimal to mild cough with deep breathing.  Was recently evaluated in the outpatient setting for shingles.  Has completed course of steroids as well as antivirals with resolution of symptoms.  Mild abdominal pain.  No diarrhea or dysuria.  No focal hemiparesis or confusion.  No headache.  No reported alcohol or tobacco use.  Has been using estradiol chronically.  Denies any known history of PEs or blood clots in the past.  No reported recent extended trips.  Vascular surgery consulted to evaluate.  Past Medical History:  Diagnosis Date   Anxiety    Barrett esophagus    Cataract cortical, senile    Degenerative disc disease, cervical    Depression    Diverticulosis    Environmental allergies    Exostosis    Fibrocystic breast disease    Fibrocystic breast disease    Gastritis    GERD (gastroesophageal reflux disease)    Hallux valgus    HH (hiatus hernia)    History of degenerative disc disease    History of shingles    Hypercholesterolemia    Hyperlipidemia    Hypertension    Internal hemorrhoids    Menopausal syndrome    Recurrent sinus infections    Vaginitis     Past Surgical History:  Procedure Laterality Date   ABDOMINAL  HYSTERECTOMY     APPENDECTOMY     BREAST BIOPSY Left 2015   APOCRINE CYST   BREAST CYST ASPIRATION Left 2015   neg   BREAST SURGERY     biopsy   CATARACT EXTRACTION W/ INTRAOCULAR LENS IMPLANTW/ TRABECULECTOMY     CHOLECYSTECTOMY     COLONOSCOPY WITH PROPOFOL  N/A 08/25/2015   Procedure: COLONOSCOPY WITH PROPOFOL ;  Surgeon: Deveron Fly, MD;  Location: [facility] ENDOSCOPY;  Service: Endoscopy;  Laterality: N/A;   ESOPHAGOGASTRODUODENOSCOPY (EGD) WITH PROPOFOL  N/A 08/25/2015   Procedure: ESOPHAGOGASTRODUODENOSCOPY (EGD) WITH PROPOFOL ;  Surgeon: Deveron Fly, MD;  Location: [facility] ENDOSCOPY;  Service: Endoscopy;  Laterality: N/A;   ESOPHAGOGASTRODUODENOSCOPY (EGD) WITH PROPOFOL  N/A 02/20/2018   Procedure: ESOPHAGOGASTRODUODENOSCOPY (EGD) WITH PROPOFOL ;  Surgeon: Deveron Fly, MD;  Location: [facility] ENDOSCOPY;  Service: Endoscopy;  Laterality: N/A;   HERNIA REPAIR     LESION REMOVAL Left 04/19/2024   Procedure: EXCISION, LESION, NECK;  Surgeon: Conrado Delay, DO;  Location: [facility] ORS;  Service: General;  Laterality: Left;   NISSEN FUNDOPLICATION     TONSILLECTOMY      Allergies  Allergen Reactions   Ciprofloxacin Rash   Amlodipine Swelling   Amoxicillin    Ceclor [Cefaclor]    Codeine    Erythromycin    Levaquin [Levofloxacin In D5w]    Macrodantin [Nitrofurantoin Macrocrystal]    Zithromax [Azithromycin]    Hydrochlorothiazide Rash  dehydation   Sulfa Antibiotics Rash    Prior to Admission medications   Medication Sig Start Date End Date Taking? Authorizing Provider  acetaminophen  (TYLENOL ) 650 MG CR tablet Take 650-1,300 mg by mouth every 8 (eight) hours as needed for pain.   Yes [provider]  diltiazem (CARDIZEM CD) 120 MG 24 hr capsule Take 120 mg by mouth in the morning.   Yes [provider]  escitalopram (LEXAPRO) 10 MG tablet Take 10 mg by mouth daily. 04/24/24 07/23/24 Yes [provider]  estradiol (ESTRACE) 1 MG tablet Take 1 mg by  mouth in the morning.   Yes [provider]  hydrOXYzine (ATARAX) 25 MG tablet Take 25 mg by mouth 3 (three) times daily as needed for itching. 04/24/24 05/04/24 Yes [provider]  ipratropium (ATROVENT) 0.03 % nasal spray Place 2 sprays into both nostrils in the morning.   Yes [provider]  omeprazole (PRILOSEC) 20 MG capsule Take 20 mg by mouth in the morning.   Yes [provider]  traMADol  (ULTRAM ) 50 MG tablet Take 1 tablet (50 mg total) by mouth as needed for severe pain (pain score 7-10). 04/19/24  Yes Sakai, Isami, DO  valACYclovir (VALTREX) 1000 MG tablet Take 1,000 mg by mouth 3 (three) times daily. 04/13/24  Yes [provider]    Social History   Socioeconomic History   Marital status: Widowed    Spouse name: Not on file   Number of children: 2   Years of education: Not on file   Highest education level: Not on file  Occupational History   Not on file  Tobacco Use   Smoking status: Never   Smokeless tobacco: Never  Substance and Sexual Activity   Alcohol use: No   Drug use: No   Sexual activity: Not on file  Other Topics Concern   Not on file  Social History Narrative   She lives alone.    Social Drivers of Corporate investment banker Strain: Low Risk  (04/24/2024)   Received from [facility]   Overall Financial Resource Strain (CARDIA)    Difficulty of Paying Living Expenses: Not hard at all  Food Insecurity: No Food Insecurity (05/01/2024)   Hunger Vital Sign    Worried About Running Out of Food in the Last Year: Never true    Ran Out of Food in the Last Year: Never true  Transportation Needs: No Transportation Needs (05/01/2024)   PRAPARE - Administrator, Civil Service (Medical): No    Lack of Transportation (Non-Medical): No  Physical Activity: Not on file  Stress: Not on file  Social Connections: Moderately Integrated (05/01/2024)   Social Connection and Isolation Panel [NHANES]     Frequency of Communication with Friends and Family: More than three times a week    Frequency of Social Gatherings with Friends and Family: More than three times a week    Attends Religious Services: More than 4 times per year    Active Member of [employer] or Organizations: Yes    Attends Banker Meetings: More than 4 times per year    Marital Status: Widowed  Intimate Partner Violence: Not At Risk (05/01/2024)   Humiliation, Afraid, Rape, and Kick questionnaire    Fear of Current or Ex-Partner: No    Emotionally Abused: No    Physically Abused: No    Sexually Abused: No     Family History  Problem Relation Age of Onset  Breast cancer Sister 29        2 sisters   Breast cancer Paternal Aunt    Breast cancer Other     ROS: Otherwise negative unless mentioned in HPI  Physical Examination  Vitals:   05/01/24 1430 05/01/24 1525  BP: 138/72 138/76  Pulse: 82 93  Resp: (!) 21 (!) 21  Temp:    SpO2: 95% 95%   Body mass index is 29.53 kg/m.  General:  WDWN in NAD Gait: Not observed HENT: WNL, normocephalic Pulmonary: normal non-labored breathing, without Rales, rhonchi,  wheezing Cardiac: regular, tachycardia 110, without  Murmurs, rubs or gallops; without carotid bruits Abdomen: Positive bowel sounds throughout, soft, NT/ND, no masses Skin: without rashes Vascular Exam/Pulses: Palpable pulses throughout upper and lower extremities.  All extremities warm to touch. Extremities: without ischemic changes, without Gangrene , without cellulitis; without open wounds;  Musculoskeletal: no muscle wasting or atrophy  Neurologic: A&O X 3;  No focal weakness or paresthesias are detected; speech is fluent/normal Psychiatric:  The pt has Normal affect. Lymph:  Unremarkable  CBC    Component Value Date/Time   WBC 7.1 05/01/2024 0557   RBC 3.98 05/01/2024 0557   HGB 12.8 05/01/2024 0557   HCT 36.8 05/01/2024 0557   PLT 162 05/01/2024 0557   MCV 92.5 05/01/2024 0557   MCH  32.2 05/01/2024 0557   MCHC 34.8 05/01/2024 0557   RDW 13.2 05/01/2024 0557   LYMPHSABS 0.9 05/01/2024 0557   MONOABS 0.9 05/01/2024 0557   EOSABS 0.5 05/01/2024 0557   BASOSABS 0.0 05/01/2024 0557    BMET    Component Value Date/Time   NA 130 (L) 05/01/2024 0557   K 3.4 (L) 05/01/2024 0557   CL 98 05/01/2024 0557   CO2 25 05/01/2024 0557   GLUCOSE 110 (H) 05/01/2024 0557   BUN 11 05/01/2024 0557   CREATININE 0.91 05/01/2024 0557   CALCIUM 8.2 (L) 05/01/2024 0557   GFRNONAA >60 05/01/2024 0557   GFRAA >60 06/04/2020 0458    COAGS: No results found for: "INR", "PROTIME"   Non-Invasive Vascular Imaging:   EXAM: CT ANGIOGRAPHY CHEST   CT ABDOMEN AND PELVIS WITH CONTRAST   TECHNIQUE: Multidetector CT imaging of the chest was performed using the standard protocol during bolus administration of intravenous contrast. Multiplanar CT image reconstructions and MIPs were obtained to evaluate the vascular anatomy. Multidetector CT imaging of the abdomen and pelvis was performed using the standard protocol during bolus administration of intravenous contrast.   RADIATION DOSE REDUCTION: This exam was performed according to the departmental dose-optimization program which includes automated exposure control, adjustment of the mA and/or kV according to patient size and/or use of iterative reconstruction technique.   CONTRAST:  OMNIPAQUE  IOHEXOL  350 MG/ML SOLN   COMPARISON:  CT angiography chest from 05/11/2021 and CT scan abdomen and pelvis from 01/24/2019.   FINDINGS: CTA CHEST FINDINGS   Cardiovascular: There is moderate-to-large volume acute occlusive and nonocclusive embolism extending from bilateral lobar pulmonary artery branches up to the subsegmental pulmonary artery branches throughout bilateral lungs. No right heart strain. No frank lung infarction.   Normal cardiac size. No pericardial effusion. No aortic aneurysm. There are coronary artery  calcifications, in keeping with coronary artery disease. There are also mild peripheral atherosclerotic vascular calcifications of thoracic aorta and its major branches.   Mediastinum/Nodes: Visualized thyroid  gland appears grossly unremarkable. No solid / cystic mediastinal masses. The esophagus is nondistended precluding optimal assessment. There are few mildly prominent mediastinal and  hilar lymph nodes, which do not meet the size criteria for lymphadenopathy and appear mildly decreased in size since the prior study, favoring benign etiology. No axillary lymphadenopathy by size criteria.   Lungs/Pleura: The central tracheo-bronchial tree is patent. There are diffuse patchy ground-glass opacities scattered throughout bilateral lungs, which is nonspecific. No mass or dense consolidation. No pleural effusion or pneumothorax. No suspicious lung nodules.   Musculoskeletal: The visualized soft tissues of the chest wall are grossly unremarkable. No suspicious osseous lesions. There are mild multilevel degenerative changes in the visualized spine.   Review of the MIP images confirms the above findings.   CT ABDOMEN and PELVIS FINDINGS   Hepatobiliary: The liver is normal in size. Non-cirrhotic configuration. No suspicious mass. No intrahepatic or extrahepatic bile duct dilation. Gallbladder is surgically absent.   Pancreas: Unremarkable. No pancreatic ductal dilatation or surrounding inflammatory changes.   Spleen: Within normal limits. No focal lesion.   Adrenals/Urinary Tract: Adrenal glands are unremarkable. No suspicious renal mass. There is a partially exophytic 6.1 x 6.4 cm cyst arising from the right kidney interpolar region. There are additional smaller sinus cysts in the left kidney. No nephroureterolithiasis or obstructive uropathy. Unremarkable urinary bladder.   Stomach/Bowel: There is a tiny sliding hiatal hernia. No disproportionate dilation of the small or large  bowel loops. No evidence of abnormal bowel wall thickening or inflammatory changes. The appendix was not visualized; however there is no acute inflammatory process in the right lower quadrant. There are multiple diverticula mainly in the sigmoid colon, without imaging signs of diverticulitis.   Vascular/Lymphatic: No ascites or pneumoperitoneum. No abdominal or pelvic lymphadenopathy, by size criteria. No aneurysmal dilation of the major abdominal arteries. There are mild peripheral atherosclerotic vascular calcifications of the aorta and its major branches.   Reproductive: The uterus is surgically absent. There is a fluid attenuation 1.9 x 2.3 cm structure in the right adnexa, incompletely characterized on the current exam but favored to represent a senescent ovarian cyst. No significant interval change since the prior study. No other large adnexal mass.   Other: Redemonstration of pelvic floor prolapse with rectocele, grossly similar to the prior study. There is a small fat containing umbilical hernia. The soft tissues and abdominal wall are otherwise unremarkable.   Musculoskeletal: No suspicious osseous lesions. There are mild - moderate multilevel degenerative changes in the visualized spine.   Review of the MIP images confirms the above findings.   IMPRESSION: 1. There is moderate-to-large volume acute occlusive and nonocclusive pulmonary embolism extending from bilateral lobar pulmonary artery branches up to the subsegmental pulmonary artery branches throughout bilateral lungs. No right heart strain. No lung infarction. 2. No acute inflammatory process identified within the abdomen or pelvis. No bowel obstruction. 3. There are diffuse patchy ground-glass opacities scattered throughout bilateral lungs, which is nonspecific. Findings may represent sequela of infection or inflammation. Correlate clinically. 4. Multiple other nonacute observations, as described  above.   EXAM: BILATERAL LOWER EXTREMITY VENOUS DOPPLER ULTRASOUND   TECHNIQUE: Gray-scale sonography with compression, as well as color and duplex ultrasound, were performed to evaluate the deep venous system(s) from the level of the common femoral vein through the popliteal and proximal calf veins.   COMPARISON:  CT PA performed May 01, 2024   FINDINGS: VENOUS   Right lower extremity: Right common femoral and femoral veins are patent. The right popliteal vein is partially compressible. A peroneal vein is also identified on the right which is partially compressible.   Left lower extremity:  Left common femoral, femoral, popliteal veins are patent. The left peroneal vein is non compressible.   IMPRESSION: 1. Partially occlusive thrombus is favored within the right popliteal vein. A right peroneal vein thrombus is also favored. 2. Near occlusive thrombus is favored within a left peroneal vein.    Statin:  No. Beta Blocker:  No. Aspirin:  No. ACEI:  No. ARB:  No. CCB use:  Yes Other antiplatelets/anticoagulants:  No.    ASSESSMENT/PLAN: This is a 89 y.o. female who presents to [facility] emergency department for worsening shortness of breath.  Upon workup she was found to have bilateral pulmonary embolisms as well as bilateral lower nonocclusive deep vein thrombosis.  She continues to have some labored breathing at rest and requires nasal cannula oxygen at 2 to 3 L.  Therefore vascular surgery recommends a pulmonary thrombectomy.  Vascular surgery plans on taking the patient to the vascular lab on 05/03/2024 for a pulmonary thrombectomy.  I had a long detailed discussion at the bedside yesterday afternoon with the patient and her daughters.  We discussed in detail the procedure, benefits, risk, and complications.  They all verbalized their understanding and wished to proceed to soon as possible.  I answered all their questions.  Patient was made n.p.o. after midnight tonight for  procedure tomorrow.   -I discussed the case in detail with Dr. Devon Fogo MD and he agrees with the plan.   Annamaria [facility]rette [facility] 05/01/2024 4:06 PM

## 2024-05-01 NOTE — Progress Notes (Signed)
 PT Cancellation Note  Patient Details Name: Rachel WACHSMAN MRN: [id number] DOB: 1935-05-27   Cancelled Treatment:    Reason Eval/Treat Not Completed: Patient not medically ready. chart reviewed. Per chart, chest CT revealing  acute occlusive and nonocclusive B pulmonary embolism with new O2 needs - per therapy protocol, contraindicated for exertional activity at this time. Will continue to follow and initiate services as pt medically appropriate to participate in therapy.     Darien Eden PT, DPT 1:04 PM,05/01/24

## 2024-05-01 NOTE — Assessment & Plan Note (Addendum)
 Shortness of breath Acute shortness of breath with pleuritic chest pain over multiple days CT of the chest noted for moderate-to-large volume acute occlusive and nonocclusive pulmonary embolism extending from bilateral lobar pulmonary artery branches up to the subsegmental pulmonary artery branches throughout bilateral lungs without heart strain Started on treatment dose Lovenox in the ER-will continue  Lower extremity ultrasound x 1 Hold offending agents including estradiol Noted groundglass changes in the lungs bilaterally with concern for?  Infection Status post IV Rocephin and azithromycin in the ER White count within normal limits Procalcitonin pending to correlate Minimal hypoxia at present Defer continuation of antibiotics for now Supplemental oxygen as needed Pain control Monitor

## 2024-05-01 NOTE — ED Notes (Signed)
 Pt placed on 2L [place] for oxygen saturation of 90-92% oxygen saturation on room air.

## 2024-05-02 DIAGNOSIS — Z882 Allergy status to sulfonamides status: Secondary | ICD-10-CM | POA: Diagnosis not present

## 2024-05-02 DIAGNOSIS — Z8619 Personal history of other infectious and parasitic diseases: Secondary | ICD-10-CM | POA: Diagnosis not present

## 2024-05-02 DIAGNOSIS — Z888 Allergy status to other drugs, medicaments and biological substances status: Secondary | ICD-10-CM | POA: Diagnosis not present

## 2024-05-02 DIAGNOSIS — Z90710 Acquired absence of both cervix and uterus: Secondary | ICD-10-CM | POA: Diagnosis not present

## 2024-05-02 DIAGNOSIS — I2609 Other pulmonary embolism with acute cor pulmonale: Secondary | ICD-10-CM | POA: Diagnosis not present

## 2024-05-02 DIAGNOSIS — R531 Weakness: Secondary | ICD-10-CM

## 2024-05-02 DIAGNOSIS — E7800 Pure hypercholesterolemia, unspecified: Secondary | ICD-10-CM | POA: Diagnosis present

## 2024-05-02 DIAGNOSIS — Z883 Allergy status to other anti-infective agents status: Secondary | ICD-10-CM | POA: Diagnosis not present

## 2024-05-02 DIAGNOSIS — J9601 Acute respiratory failure with hypoxia: Secondary | ICD-10-CM | POA: Diagnosis present

## 2024-05-02 DIAGNOSIS — R0609 Other forms of dyspnea: Secondary | ICD-10-CM

## 2024-05-02 DIAGNOSIS — I82452 Acute embolism and thrombosis of left peroneal vein: Secondary | ICD-10-CM | POA: Diagnosis present

## 2024-05-02 DIAGNOSIS — E860 Dehydration: Secondary | ICD-10-CM | POA: Diagnosis present

## 2024-05-02 DIAGNOSIS — Z881 Allergy status to other antibiotic agents status: Secondary | ICD-10-CM | POA: Diagnosis not present

## 2024-05-02 DIAGNOSIS — I82431 Acute embolism and thrombosis of right popliteal vein: Secondary | ICD-10-CM | POA: Diagnosis present

## 2024-05-02 DIAGNOSIS — I2699 Other pulmonary embolism without acute cor pulmonale: Secondary | ICD-10-CM | POA: Diagnosis present

## 2024-05-02 DIAGNOSIS — I82433 Acute embolism and thrombosis of popliteal vein, bilateral: Secondary | ICD-10-CM

## 2024-05-02 DIAGNOSIS — Z1152 Encounter for screening for COVID-19: Secondary | ICD-10-CM | POA: Diagnosis not present

## 2024-05-02 DIAGNOSIS — I50810 Right heart failure, unspecified: Secondary | ICD-10-CM | POA: Diagnosis not present

## 2024-05-02 DIAGNOSIS — Z880 Allergy status to penicillin: Secondary | ICD-10-CM | POA: Diagnosis not present

## 2024-05-02 DIAGNOSIS — Z66 Do not resuscitate: Secondary | ICD-10-CM | POA: Diagnosis present

## 2024-05-02 DIAGNOSIS — K219 Gastro-esophageal reflux disease without esophagitis: Secondary | ICD-10-CM | POA: Diagnosis present

## 2024-05-02 DIAGNOSIS — Z79899 Other long term (current) drug therapy: Secondary | ICD-10-CM | POA: Diagnosis not present

## 2024-05-02 DIAGNOSIS — Z803 Family history of malignant neoplasm of breast: Secondary | ICD-10-CM | POA: Diagnosis not present

## 2024-05-02 DIAGNOSIS — I10 Essential (primary) hypertension: Secondary | ICD-10-CM | POA: Diagnosis present

## 2024-05-02 DIAGNOSIS — Z885 Allergy status to narcotic agent status: Secondary | ICD-10-CM | POA: Diagnosis not present

## 2024-05-02 LAB — COMPREHENSIVE METABOLIC PANEL WITH GFR
ALT: 16 U/L (ref 0–44)
AST: 16 U/L (ref 15–41)
Albumin: 2.8 g/dL — ABNORMAL LOW (ref 3.5–5.0)
Alkaline Phosphatase: 73 U/L (ref 38–126)
Anion gap: 10 (ref 5–15)
BUN: 9 mg/dL (ref 8–23)
CO2: 24 mmol/L (ref 22–32)
Calcium: 8.1 mg/dL — ABNORMAL LOW (ref 8.9–10.3)
Chloride: 101 mmol/L (ref 98–111)
Creatinine, Ser: 0.65 mg/dL (ref 0.44–1.00)
GFR, Estimated: >60 mL/min (ref >60)
Glucose, Bld: 97 mg/dL (ref 70–99)
Potassium: 3.5 mmol/L (ref 3.5–5.1)
Sodium: 135 mmol/L (ref 135–145)
Total Bilirubin: 0.8 mg/dL (ref 0.0–1.2)
Total Protein: 6.3 g/dL — ABNORMAL LOW (ref 6.5–8.1)

## 2024-05-02 LAB — CBG MONITORING, ED: Glucose-Capillary: 90 mg/dL (ref 70–99)

## 2024-05-02 LAB — CBC
HCT: 35.5 % — ABNORMAL LOW (ref 36.0–46.0)
Hemoglobin: 12.1 g/dL (ref 12.0–15.0)
MCH: 32 pg (ref 26.0–34.0)
MCHC: 34.1 g/dL (ref 30.0–36.0)
MCV: 93.9 fL (ref 80.0–100.0)
Platelets: 179 10*3/uL (ref 150–400)
RBC: 3.78 MIL/uL — ABNORMAL LOW (ref 3.87–5.11)
RDW: 13.3 % (ref 11.5–15.5)
WBC: 6.5 10*3/uL (ref 4.0–10.5)
nRBC: 0 % (ref 0.0–0.2)

## 2024-05-02 MED ORDER — APIXABAN 5 MG PO TABS: 5.0000 mg | ORAL_TABLET | Freq: Two times a day (BID) | ORAL | 5 refills | Status: DC

## 2024-05-02 MED ORDER — LACTULOSE 10 GM/15ML PO SOLN
20.0000 g | Freq: Two times a day (BID) | ORAL | Status: DC
  Administered 2024-05-02 – 2024-05-04 (×3): 20 g via ORAL
  Filled 2024-05-02 (×6): qty 30

## 2024-05-02 MED ORDER — APIXABAN 5 MG PO TABS: 10.0000 mg | ORAL_TABLET | Freq: Two times a day (BID) | ORAL | 0 refills | Status: DC

## 2024-05-02 MED ORDER — POLYETHYLENE GLYCOL 3350 17 G PO PACK
17.0000 g | PACK | Freq: Every day | ORAL | Status: DC
  Administered 2024-05-04: 17 g via ORAL
  Filled 2024-05-02 (×4): qty 1

## 2024-05-02 NOTE — ED Notes (Signed)
 Pt called out and assisted back to bed, unsuccessful BM attempt. Pt on cardiac monitor. Denies needs at this time. Lights dimmed for comfort, call light within reach.

## 2024-05-02 NOTE — ED Notes (Signed)
 Pt ambulatory to bathroom independently using walker with steady gait

## 2024-05-02 NOTE — Evaluation (Signed)
 Physical Therapy Evaluation Patient Details Name: Rachel Harris MRN: [id number] DOB: Apr 29, 1935 Today's Date: 05/02/2024  History of Present Illness  Pt is a 89 y.o. female PMH of GERD, hypertension, chronic estradiol use presenting with shortness of breath and PE. She reports shortness of breath over multiple weeks.  Clinical Impression  Pt received in Semi-Fowler's position and agreeable to therapy.  Pt very pleasant and ready to begin ambulation attempt.  Pt noted she normally breathes better, but is having some difficulty with this due to the blood clots present in her lungs.  Pt reports that she is anticipating having surgery tomorrow to have them removed.  Pt performed well with transfers and was able to ambulate around the nursing station in the ED before returning to the room.  Pt left with all needs met and call bell within reach.        If plan is discharge home, recommend the following: A little help with walking and/or transfers;A little help with bathing/dressing/bathroom;Assist for transportation;Help with stairs or ramp for entrance   Can travel by private vehicle        Equipment Recommendations None recommended by PT  Recommendations for Other Services       Functional Status Assessment Patient has had a recent decline in their functional status and demonstrates the ability to make significant improvements in function in a reasonable and predictable amount of time.     Precautions / Restrictions Precautions Precautions: Fall Restrictions Weight Bearing Restrictions Per Provider Order: No      Mobility  Bed Mobility Overal bed mobility: Modified Independent                  Transfers Overall transfer level: Needs assistance Equipment used: Rolling walker (2 wheels) Transfers: Sit to/from Stand Sit to Stand: Contact guard assist, Supervision           General transfer comment: CGA to SBA for STS and mobility ~80-85 feet using RW to bathroom and  back    Ambulation/Gait Ambulation/Gait assistance: Supervision, Contact guard assist Gait Distance (Feet): 120 Feet Assistive device: Rolling walker (2 wheels) Gait Pattern/deviations: WFL(Within Functional Limits), Step-through pattern Gait velocity: decreased     General Gait Details: Pt able to ambulate well with the use of the RW.  Stairs            Wheelchair Mobility     Tilt Bed    Modified Rankin (Stroke Patients Only)       Balance Overall balance assessment: Needs assistance Sitting-balance support: Feet supported Sitting balance-Leahy Scale: Normal     Standing balance support: During functional activity, Bilateral upper extremity supported Standing balance-Leahy Scale: Fair Standing balance comment: no LOB with mobility around the nursing station                             Pertinent Vitals/Pain Pain Assessment Pain Assessment: No/denies pain Pain Location: low back pain-chronic Pain Descriptors / Indicators: Aching    Home Living Family/patient expects to be discharged to:: Private residence Living Arrangements: Alone Available Help at Discharge: Family;Available PRN/intermittently (daughter 4 miles away) Type of Home: Mobile home Home Access: Stairs to enter Entrance Stairs-Rails: Left Entrance Stairs-Number of Steps: 4   Home Layout: One level Home Equipment: Cane - single point;Hand held shower head      Prior Function Prior Level of Function : Independent/Modified Independent;Driving  Mobility Comments: SPC for the last week; no AD prior to that; denies falls ADLs Comments: IND with ADL/IADLs, driving, grocery shopping     Extremity/Trunk Assessment   Upper Extremity Assessment Upper Extremity Assessment: Overall WFL for tasks assessed    Lower Extremity Assessment Lower Extremity Assessment: Overall WFL for tasks assessed       Communication   Communication Communication: No apparent  difficulties    Cognition Arousal: Alert Behavior During Therapy: WFL for tasks assessed/performed                             Following commands: Intact       Cueing Cueing Techniques: Verbal cues     General Comments General comments (skin integrity, edema, etc.): Pt desaturated to 87% on rom air with mobilization.    Exercises     Assessment/Plan    PT Assessment Patient needs continued PT services  PT Problem List Decreased strength;Decreased activity tolerance;Decreased balance;Decreased mobility       PT Treatment Interventions Gait training;Stair training;Functional mobility training;Therapeutic activities;Therapeutic exercise;Balance training;Neuromuscular re-education    PT Goals (Current goals can be found in the Care Plan section)  Acute Rehab PT Goals Patient Stated Goal: to return home. Time For Goal Achievement: 05/16/24 Potential to Achieve Goals: Good    Frequency Min 1X/week     Co-evaluation               AM-PAC PT "6 Clicks" Mobility  Outcome Measure Help needed turning from your back to your side while in a flat bed without using bedrails?: A Little Help needed moving from lying on your back to sitting on the side of a flat bed without using bedrails?: A Little Help needed moving to and from a bed to a chair (including a wheelchair)?: A Little Help needed standing up from a chair using your arms (e.g., wheelchair or bedside chair)?: A Little Help needed to walk in hospital room?: A Little Help needed climbing 3-5 steps with a railing? : A Lot 6 Click Score: 17    End of Session   Activity Tolerance: Patient tolerated treatment well Patient left: in bed;with call bell/phone within reach;with bed alarm set Nurse Communication: Mobility status PT Visit Diagnosis: Unsteadiness on feet (R26.81);Muscle weakness (generalized) (M62.81);Difficulty in walking, not elsewhere classified (R26.2)    Time: 9604-5409 PT Time Calculation  (min) (ACUTE ONLY): 19 min   Charges:   PT Evaluation $PT Eval Low Complexity: 1 Low PT Treatments $Therapeutic Activity: 8-22 mins PT General Charges $$ ACUTE PT VISIT: 1 Visit         Rozanna Corner, PT, DPT Physical Therapist - [facility]  05/02/24, 1:39 PM

## 2024-05-02 NOTE — ED Notes (Signed)
 Pt oxygen saturation 87% while ambulating on room air.

## 2024-05-02 NOTE — Care Management Obs Status (Signed)
 MEDICARE OBSERVATION STATUS NOTIFICATION   Patient Details  Name: Rachel Harris MRN: [id number] Date of Birth: 1935/07/01   Medicare Observation Status Notification Given:  Rudolph Cost, CMA 05/02/2024, 10:39 AM

## 2024-05-02 NOTE — ED Notes (Signed)
OT with patient. 

## 2024-05-02 NOTE — ED Notes (Signed)
 Pt called out to report she had a sudden onset of feeling hot immediately followed by chills. Pt reports feeling weak and like she needs to have a BM. Pt ambulated to bathroom w/ stand by assist. Pt currently attempting BM, pt verbalized understanding of call light in restroom and to utilize it when she is ready to return to bed.

## 2024-05-02 NOTE — Progress Notes (Signed)
 Progress Note   Patient: Rachel Harris WUJ:[id number] DOB: 1935-04-18 DOA: 05/01/2024     0 DOS: the patient was seen and examined on 05/02/2024   Brief hospital course: "HPI: [name] is a 89 y.o. female with medical history significant of GERD, hypertension, chronic estradiol use presenting with shortness of breath and PE.  Patient reports shortness of breath over multiple weeks.  Cannot fully specify how long this been going on.  Positive generalized increased work of breathing.  This is acutely worsened over the past 1 to 2 days.  No fevers or chills.  Minimal to mild cough with deep breathing.  Was recently evaluated in the outpatient setting for shingles.  Has completed course of steroids as well as antivirals with resolution of symptoms.  Mild abdominal pain.  No diarrhea or dysuria.  No focal hemiparesis or confusion.  No headache.  No reported alcohol or tobacco use.  Has been using estradiol chronically.  Denies any known history of PEs or blood clots in the past.  No reported recent extended trips. Presenting to the ER afebrile, hemodynamically stable.  Satting in low 90s on room air.  White count 7.1, hemoglobin 12.8, platelets 162, creatinine 0.9.  COVID flu and RSV negative.  CT of the chest showing moderate to large volume acute occlusive and nonocclusive PE extending from bilateral lobar pulmonary arteries bilaterally's.  No reported heart strain.  Noted groundglass opacities in bilateral lungs though nonspecific.  EKG normal sinus rhythm.:  "  Assessment and Plan:  Pulmonary embolism (HCC) Shortness of breath Acute shortness of breath with pleuritic chest pain over multiple days CT of the chest noted for moderate-to-large volume acute occlusive and nonocclusive pulmonary embolism extending from bilateral lobar pulmonary artery branches up to the subsegmental pulmonary artery branches throughout bilateral lungs without heart strain Vascular surgeon planning on pulmonary  thrombectomy tomorrow Plan of care discussed with vascular surgery Continue current supplemental oxygen Continue Lovenox therapeutic dose   History of shingles Recent episode of shingles status post course of prednisone and acyclovir Resolved   Hypertension BP stable Continue home regimen   GERD (gastroesophageal reflux disease) Continue PPI      Advance Care Planning:   Code Status: Limited: Do not attempt resuscitation (DNR) -DNR-LIMITED -Do Not Intubate/DNI     Consults: Vascular surgery   Family Communication: Family at the bedside    Subjective:  Patient seen and examined at bedside this morning Currently on 2 L of intranasal oxygen Denied worsening chest pain nausea vomiting or abdominal pain Vascular surgeon planning on pulmonary thrombectomy tomorrow  Physical Exam:    Appearance: She is normal weight.  HENT:     Head: Normocephalic and atraumatic.     Nose: Nose normal.     Mouth/Throat:     Mouth: Mucous membranes are moist.  Eyes:     Pupils: Pupils are equal, round, and reactive to light.  Cardiovascular:     Rate and Rhythm: Normal rate and regular rhythm.  Pulmonary:     Effort: Pulmonary effort is normal.  Abdominal:     General: Bowel sounds are normal.  Musculoskeletal:        General: Normal range of motion.  Skin:    General: Skin is warm.  Neurological:     General: No focal deficit present.  Psychiatric:        Mood and Affect: Mood normal.  Vitals:   05/02/24 0600 05/02/24 0800 05/02/24 1013 05/02/24 1256  BP: (!) 152/94 130/70 Aaron Aas)  109/94 138/79  Pulse: 91 70 87 88  Resp: (!) 24 16 20 18   Temp: 98.6 F (37 C)     TempSrc: Oral     SpO2: 95% 94% 95% 96%  Weight:      Height:        Data Reviewed:  I have reviewed patient labs imaging showing findings of pulmonary embolism    Latest Ref Rng & Units 05/02/2024    5:39 AM 05/01/2024    5:57 AM 04/28/2024    5:59 PM  CBC  WBC 4.0 - 10.5 K/uL 6.5  7.1  8.3   Hemoglobin 12.0 -  15.0 g/dL 47.8  29.5  62.1   Hematocrit 36.0 - 46.0 % 35.5  36.8  42.1   Platelets 150 - 400 K/uL 179  162  182        Latest Ref Rng & Units 05/02/2024    5:39 AM 05/01/2024    5:57 AM 04/28/2024    5:59 PM  BMP  Glucose 70 - 99 mg/dL 97  308  657   BUN 8 - 23 mg/dL 9  11  17    Creatinine 0.44 - 1.00 mg/dL 8.46  9.62  9.52   Sodium 135 - 145 mmol/L 135  130  132   Potassium 3.5 - 5.1 mmol/L 3.5  3.4  4.2   Chloride 98 - 111 mmol/L 101  98  103   CO2 22 - 32 mmol/L 24  25  26    Calcium 8.9 - 10.3 mg/dL 8.1  8.2  7.9      Time spent: 56 minutes  Author: Ezzard Holms, MD 05/02/2024 3:04 PM  For on call review [email or web address].

## 2024-05-02 NOTE — Evaluation (Signed)
 Occupational Therapy Evaluation Patient Details Name: Rachel Harris MRN: [id number] DOB: 1935/05/08 Today's Date: 05/02/2024   History of Present Illness   Pt is a 89 y.o. female PMH of GERD, hypertension, chronic estradiol use presenting with shortness of breath and PE. She reports shortness of breath over multiple weeks.     Clinical Impressions Pt was seen for OT evaluation this date. PTA, pt was living alone in a 1 level home with 4 STE and one HR. Reports IND at baseline, ambulates without AD household distances, will use a rollator at times for community distances, grocery shops and drives. Denies falls. Daughter lives close by and is available PRN.   Pt presents to acute OT demonstrating impaired ADL performance and functional mobility 2/2 weakness and low activity tolerance. Pt currently requires MOD I for bed mobility and CGA/SBA for STS to RW and ~80 ft of mobility to the bathroom and back using RW with no LOB. VSS on 1-2L 02 via [place] 90-97%. HR up to 121 at most. All aspects of toileting performed with SBA, as well as hand hygiene standing at the sink. Pt would benefit from skilled OT services to address noted impairments and functional limitations to maximize safety and independence while minimizing falls risk and caregiver burden. Recommend HHOT, although pt may not need any follow up OT services.      If plan is discharge home, recommend the following:   A little help with walking and/or transfers;A little help with bathing/dressing/bathroom;Assist for transportation;Help with stairs or ramp for entrance     Functional Status Assessment   Patient has had a recent decline in their functional status and demonstrates the ability to make significant improvements in function in a reasonable and predictable amount of time.     Equipment Recommendations   Other (comment) (RW)     Recommendations for Other Services         Precautions/Restrictions    Precautions Precautions: Fall Restrictions Weight Bearing Restrictions Per Provider Order: No     Mobility Bed Mobility Overal bed mobility: Modified Independent                  Transfers Overall transfer level: Needs assistance Equipment used: Rolling walker (2 wheels) Transfers: Sit to/from Stand Sit to Stand: Contact guard assist, Supervision           General transfer comment: CGA to SBA for STS and mobility ~80-85 feet using RW to bathroom and back      Balance Overall balance assessment: Needs assistance Sitting-balance support: Feet supported Sitting balance-Leahy Scale: Normal     Standing balance support: No upper extremity supported, During functional activity Standing balance-Leahy Scale: Fair Standing balance comment: no LOB during mobility or hand washing at sink or during clothing management                           ADL either performed or assessed with clinical judgement   ADL Overall ADL's : Needs assistance/impaired     Grooming: Wash/dry hands;Standing;Supervision/safety;Contact guard assist                   Toilet Transfer: Contact guard assist;Rolling walker (2 wheels);Grab bars   Toileting- Clothing Manipulation and Hygiene: Supervision/safety;Sitting/lateral lean       Functional mobility during ADLs: Contact guard assist;Supervision/safety;Rolling walker (2 wheels)       Vision         Perception  Praxis         Pertinent Vitals/Pain Pain Assessment Pain Assessment: 0-10 Pain Location: low back pain-chronic Pain Descriptors / Indicators: Aching Pain Intervention(s): Limited activity within patient's tolerance, Monitored during session, Repositioned     Extremity/Trunk Assessment Upper Extremity Assessment Upper Extremity Assessment: Overall WFL for tasks assessed   Lower Extremity Assessment Lower Extremity Assessment: Overall WFL for tasks assessed       Communication  Communication Communication: No apparent difficulties   Cognition Arousal: Alert Behavior During Therapy: WFL for tasks assessed/performed Cognition: No apparent impairments                               Following commands: Intact       Cueing  General Comments   Cueing Techniques: Verbal cues  HR up to 121 and sp02 90% on 1L; placed back on 2L and maintained 97%   Exercises Other Exercises Other Exercises: Edu on role of OT in acute setting, use of RW as needed for safety, pacing, ECS.   Shoulder Instructions      Home Living Family/patient expects to be discharged to:: Private residence Living Arrangements: Alone Available Help at Discharge: Family;Available PRN/intermittently (daughter 4 miles away) Type of Home: Mobile home Home Access: Stairs to enter Secretary/administrator of Steps: 4 Entrance Stairs-Rails: Left Home Layout: One level     Bathroom Shower/Tub: Chief Strategy Officer: Handicapped height Bathroom Accessibility: No   Home Equipment: Cane - single point;Hand held shower head          Prior Functioning/Environment Prior Level of Function : Independent/Modified Independent;Driving             Mobility Comments: SPC for the last week; no AD prior to that; denies falls ADLs Comments: IND with ADL/IADLs, driving, grocery shopping    OT Problem List: Decreased activity tolerance;Decreased strength   OT Treatment/Interventions: Self-care/ADL training;Balance training;Therapeutic exercise;Therapeutic activities;Energy conservation;DME and/or AE instruction;Patient/family education      OT Goals(Current goals can be found in the care plan section)   Acute Rehab OT Goals Patient Stated Goal: return home OT Goal Formulation: With patient Time For Goal Achievement: 05/16/24 Potential to Achieve Goals: Good ADL Goals Pt Will Perform Lower Body Dressing: with modified independence;sit to/from stand;sitting/lateral  leans Pt Will Transfer to Toilet: with modified independence;ambulating;regular height toilet Pt Will Perform Toileting - Clothing Manipulation and hygiene: with modified independence;sitting/lateral leans;sit to/from stand Additional ADL Goal #1: Pt will demo implementation of 1 learned ECS during ADL performance 2/2 trials to maximize IND and prevent overexertion.   OT Frequency:  Min 2X/week    Co-evaluation              AM-PAC OT "6 Clicks" Daily Activity     Outcome Measure Help from another person eating meals?: None Help from another person taking care of personal grooming?: None Help from another person toileting, which includes using toliet, bedpan, or urinal?: A Little Help from another person bathing (including washing, rinsing, drying)?: A Little Help from another person to put on and taking off regular upper body clothing?: None Help from another person to put on and taking off regular lower body clothing?: A Little 6 Click Score: 21   End of Session Equipment Utilized During Treatment: Rolling walker (2 wheels);Oxygen Nurse Communication: Mobility status  Activity Tolerance: Patient tolerated treatment well Patient left: in bed;with call bell/phone within reach;with bed alarm set  OT Visit  Diagnosis: Other abnormalities of gait and mobility (R26.89)                Time: 1610-9604 OT Time Calculation (min): 39 min Charges:  OT General Charges $OT Visit: 1 Visit OT Evaluation $OT Eval Moderate Complexity: 1 Mod OT Treatments $Self Care/Home Management : 8-22 mins Sheritha Louis, OTR/L  05/02/24, 12:45 PM  Enos Muhl E Krishiv Sandler 05/02/2024, 10:57 AM

## 2024-05-02 NOTE — ED Notes (Signed)
 Pt assisted to bathroom. Pt ambulatory with walker with steady gait. Pt attempting to have BM.

## 2024-05-02 NOTE — ED Notes (Signed)
 PT at bedside.

## 2024-05-03 ENCOUNTER — Encounter
Admission: EM | Disposition: A | Discharge status: Home Health | Payer: Self-pay | Source: Home / Self Care | Attending: Internal Medicine

## 2024-05-03 DIAGNOSIS — I50810 Right heart failure, unspecified: Secondary | ICD-10-CM | POA: Diagnosis not present

## 2024-05-03 DIAGNOSIS — I2699 Other pulmonary embolism without acute cor pulmonale: Secondary | ICD-10-CM | POA: Diagnosis not present

## 2024-05-03 HISTORY — PX: PULMONARY THROMBECTOMY: CATH118295

## 2024-05-03 SURGERY — PULMONARY THROMBECTOMY
Anesthesia: Moderate Sedation | Laterality: Bilateral

## 2024-05-03 MED ORDER — MIDAZOLAM HCL 2 MG/ML PO SYRP: 8.0000 mg | ORAL_SOLUTION | Freq: Once | ORAL | Status: DC | PRN

## 2024-05-03 MED ORDER — VANCOMYCIN HCL IN DEXTROSE 1-5 GM/200ML-% IV SOLN
INTRAVENOUS | Status: AC
Start: 2024-05-03 — End: ?
  Filled 2024-05-03: qty 200

## 2024-05-03 MED ORDER — ENOXAPARIN SODIUM 100 MG/ML IJ SOSY
1.0000 mg/kg | PREFILLED_SYRINGE | Freq: Two times a day (BID) | INTRAMUSCULAR | Status: DC
  Administered 2024-05-03 – 2024-05-04 (×3): 82.5 mg via SUBCUTANEOUS
  Filled 2024-05-03 (×5): qty 1

## 2024-05-03 MED ORDER — LIDOCAINE 5 % EX PTCH
1.0000 | MEDICATED_PATCH | CUTANEOUS | Status: AC
  Administered 2024-05-03: 1 via TRANSDERMAL
  Filled 2024-05-03: qty 1

## 2024-05-03 MED ORDER — MIDAZOLAM HCL 5 MG/5ML IJ SOLN
INTRAMUSCULAR | Status: AC
  Filled 2024-05-03: qty 5

## 2024-05-03 MED ORDER — HEPARIN SODIUM (PORCINE) 1000 UNIT/ML IJ SOLN
INTRAMUSCULAR | Status: AC
Start: 2024-05-03 — End: ?
  Filled 2024-05-03: qty 10

## 2024-05-03 MED ORDER — METHYLPREDNISOLONE SODIUM SUCC 125 MG IJ SOLR: 125.0000 mg | Freq: Once | INTRAMUSCULAR | Status: DC | PRN

## 2024-05-03 MED ORDER — IODIXANOL 320 MG/ML IV SOLN
INTRAVENOUS | Status: DC | PRN
  Administered 2024-05-03: 2905 mL

## 2024-05-03 MED ORDER — DIPHENHYDRAMINE HCL 50 MG/ML IJ SOLN: 50.0000 mg | Freq: Once | INTRAMUSCULAR | Status: DC | PRN

## 2024-05-03 MED ORDER — LIDOCAINE HCL (PF) 1 % IJ SOLN
INTRAMUSCULAR | Status: DC | PRN
  Administered 2024-05-03: 5 mL

## 2024-05-03 MED ORDER — FENTANYL CITRATE (PF) 100 MCG/2ML IJ SOLN
INTRAMUSCULAR | Status: AC
  Filled 2024-05-03: qty 2

## 2024-05-03 MED ORDER — SODIUM CHLORIDE 0.9 % IV SOLN: INTRAVENOUS | Status: DC

## 2024-05-03 MED ORDER — VANCOMYCIN HCL IN DEXTROSE 1-5 GM/200ML-% IV SOLN
1000.0000 mg | INTRAVENOUS | Status: AC
  Administered 2024-05-03: 1000 mg via INTRAVENOUS

## 2024-05-03 MED ORDER — MIDAZOLAM HCL 2 MG/2ML IJ SOLN
INTRAMUSCULAR | Status: DC | PRN
  Administered 2024-05-03: 2 mg via INTRAVENOUS

## 2024-05-03 MED ORDER — FAMOTIDINE 20 MG PO TABS: 40.0000 mg | ORAL_TABLET | Freq: Once | ORAL | Status: DC | PRN

## 2024-05-03 MED ORDER — HEPARIN SODIUM (PORCINE) 1000 UNIT/ML IJ SOLN
INTRAMUSCULAR | Status: DC | PRN
  Administered 2024-05-03: 4000 [IU] via INTRAVENOUS

## 2024-05-03 MED ORDER — HEPARIN (PORCINE) IN NACL 2000-0.9 UNIT/L-% IV SOLN
INTRAVENOUS | Status: DC | PRN
  Administered 2024-05-03: 1000 mL

## 2024-05-03 MED ORDER — TRAMADOL HCL 50 MG PO TABS
50.0000 mg | ORAL_TABLET | Freq: Once | ORAL | Status: AC
  Administered 2024-05-04: 50 mg via ORAL
  Filled 2024-05-03: qty 1

## 2024-05-03 MED ORDER — FENTANYL CITRATE (PF) 100 MCG/2ML IJ SOLN
INTRAMUSCULAR | Status: DC | PRN
  Administered 2024-05-03: 25 ug via INTRAVENOUS

## 2024-05-03 SURGICAL SUPPLY — 21 items
CANISTER PENUMBRA ENGINE (MISCELLANEOUS) IMPLANT
CATH ANGIO 5F PIGTAIL 100CM (CATHETERS) IMPLANT
CATH INDIGO SEP 8 (CATHETERS) IMPLANT
CATH INFINITI JR4 5F (CATHETERS) IMPLANT
CATH LIGHTNING 8 XTORQ 115 (CATHETERS) IMPLANT
CATH SELECT BERN TIP 5F 130 (CATHETERS) IMPLANT
CLOSURE PERCLOSE PROSTYLE (VASCULAR PRODUCTS) IMPLANT
COVER PROBE ULTRASOUND 5X96 (MISCELLANEOUS) IMPLANT
GLIDEWIRE ANGLED SS 035X260CM (WIRE) IMPLANT
NDL ENTRY 21GA 7CM ECHOTIP (NEEDLE) IMPLANT
NEEDLE ENTRY 21GA 7CM ECHOTIP (NEEDLE) ×1 IMPLANT
PACK ANGIOGRAPHY (CUSTOM PROCEDURE TRAY) ×1 IMPLANT
SET INTRO CAPELLA COAXIAL (SET/KITS/TRAYS/PACK) IMPLANT
SHEATH 9FRX11 (SHEATH) IMPLANT
SHEATH BRITE TIP 6FRX11 (SHEATH) IMPLANT
SUT MNCRL AB 4-0 PS2 18 (SUTURE) IMPLANT
SUT SILK 0 FSL (SUTURE) IMPLANT
SYR MEDRAD MARK 7 150ML (SYRINGE) IMPLANT
TUBING CONTRAST HIGH PRESS 72 (TUBING) IMPLANT
WIRE AMPLATZ SSTIFF .035X260CM (WIRE) IMPLANT
WIRE J 3MM .035X145CM (WIRE) IMPLANT

## 2024-05-03 NOTE — Progress Notes (Signed)
 Pt c/o back pain where she was laying down for awhile during surgery, pt has PRN tylenol  which she can have yet. Notify provider if pt can have lidocaine  patch, and per pt she takes tramadol  50 mg at home, order given. No other concern at the moment. Plan of care continued.

## 2024-05-03 NOTE — Progress Notes (Signed)
 PT Cancellation Note  Patient Details Name: Rachel Harris MRN: [id number] DOB: 1935/10/21   Cancelled Treatment:    Reason Eval/Treat Not Completed: Patient at procedure or test/unavailable Patient having thrombectomy. Will re-attempt at later date.    Malka Bocek 05/03/2024, 1:26 PM

## 2024-05-03 NOTE — Interval H&P Note (Signed)
 History and Physical Interval Note:  05/03/2024 12:33 PM  Rachel Harris  has presented today for surgery, with the diagnosis of Pulmonary Embolism.  The various methods of treatment have been discussed with the patient and family. After consideration of risks, benefits and other options for treatment, the patient has consented to  Procedure(s): PULMONARY THROMBECTOMY (Bilateral) as a surgical intervention.  The patient's history has been reviewed, patient examined, no change in status, stable for surgery.  I have reviewed the patient's chart and labs.  Questions were answered to the patient's satisfaction.     Devon Fogo

## 2024-05-03 NOTE — Progress Notes (Signed)
 Progress Note   Patient: Rachel Harris ZOX:[id number] DOB: Nov 08, 1935 DOA: 05/01/2024     1 DOS: the patient was seen and examined on 05/03/2024     Brief hospital course: "HPI: SATIVA DRILLING is a 89 y.o. female with medical history significant of GERD, hypertension, chronic estradiol use presenting with shortness of breath and PE.  Patient reports shortness of breath over multiple weeks.  Cannot fully specify how long this been going on.  Positive generalized increased work of breathing.  This is acutely worsened over the past 1 to 2 days.  No fevers or chills.  Minimal to mild cough with deep breathing.  Was recently evaluated in the outpatient setting for shingles.  Has completed course of steroids as well as antivirals with resolution of symptoms.  Mild abdominal pain.  No diarrhea or dysuria.  No focal hemiparesis or confusion.  No headache.  No reported alcohol or tobacco use.  Has been using estradiol chronically.  Denies any known history of PEs or blood clots in the past.  No reported recent extended trips. Presenting to the ER afebrile, hemodynamically stable.  Satting in low 90s on room air.  White count 7.1, hemoglobin 12.8, platelets 162, creatinine 0.9.  COVID flu and RSV negative.  CT of the chest showing moderate to large volume acute occlusive and nonocclusive PE extending from bilateral lobar pulmonary arteries bilaterally's.  No reported heart strain.  Noted groundglass opacities in bilateral lungs though nonspecific.  EKG normal sinus rhythm.:  "   Assessment and Plan:   Pulmonary embolism (HCC) Shortness of breath Acute shortness of breath with pleuritic chest pain over multiple days CT of the chest noted for moderate-to-large volume acute occlusive and nonocclusive pulmonary embolism extending from bilateral lobar pulmonary artery branches up to the subsegmental pulmonary artery branches throughout bilateral lungs without heart strain Vascular surgeon took patient for  pulmonary thrombectomy today Plan of care discussed with vascular surgery Continue current supplemental oxygen Continue Lovenox  therapeutic dose     History of shingles Recent episode of shingles status post course of prednisone and acyclovir Resolved   Hypertension BP stable Continue home regimen   GERD (gastroesophageal reflux disease) Continue PPI      Advance Care Planning:   Code Status: Limited: Do not attempt resuscitation (DNR) -DNR-LIMITED -Do Not Intubate/DNI     Consults: Vascular surgery   Family Communication: Family at the bedside      Subjective:  Patient to continue for pulmonary thrombectomy today Denies nausea vomiting abdominal pain chest pain or cough   Physical Exam:     Appearance: She is normal weight.  HENT:     Head: Normocephalic and atraumatic.     Nose: Nose normal.     Mouth/Throat:     Mouth: Mucous membranes are moist.  Eyes:     Pupils: Pupils are equal, round, and reactive to light.  Cardiovascular:     Rate and Rhythm: Normal rate and regular rhythm.  Pulmonary:     Effort: Pulmonary effort is normal.  Abdominal:     General: Bowel sounds are normal.  Musculoskeletal:        General: Normal range of motion.  Skin:    General: Skin is warm.  Neurological:     General: No focal deficit present.  Psychiatric:        Mood and Affect: Mood normal.   Data Reviewed:   I have reviewed patient labs imaging showing findings of pulmonary embolism  Vitals:  05/03/24 1415 05/03/24 1430 05/03/24 1445 05/03/24 1454  BP: 139/77 125/69 114/66 105/63  Pulse: 89 86 (!) 109 100  Resp: 19 20 (!) 23 19  Temp:    98.6 F (37 C)  TempSrc:    Oral  SpO2: 96% 93% 93% 93%  Weight:      Height:          Latest Ref Rng & Units 05/02/2024    5:39 AM 05/01/2024    5:57 AM 04/28/2024    5:59 PM  CBC  WBC 4.0 - 10.5 K/uL 6.5  7.1  8.3   Hemoglobin 12.0 - 15.0 g/dL 16.1  09.6  04.5   Hematocrit 36.0 - 46.0 % 35.5  36.8  42.1   Platelets 150 -  400 K/uL 179  162  182        Latest Ref Rng & Units 05/02/2024    5:39 AM 05/01/2024    5:57 AM 04/28/2024    5:59 PM  BMP  Glucose 70 - 99 mg/dL 97  409  811   BUN 8 - 23 mg/dL 9  11  17    Creatinine 0.44 - 1.00 mg/dL 9.14  7.82  9.56   Sodium 135 - 145 mmol/L 135  130  132   Potassium 3.5 - 5.1 mmol/L 3.5  3.4  4.2   Chloride 98 - 111 mmol/L 101  98  103   CO2 22 - 32 mmol/L 24  25  26    Calcium 8.9 - 10.3 mg/dL 8.1  8.2  7.9      Author: Ezzard Holms, MD 05/03/2024 5:12 PM  For on call review [email or web address].

## 2024-05-03 NOTE — Progress Notes (Signed)
 Patient stable.  Bedside handoff given to [facility]

## 2024-05-03 NOTE — Op Note (Signed)
 [facility]  Percutaneous Study/Intervention Procedural Note   Date of Surgery: 05/03/2024,1:28 PM  Surgeon:Ceira Hoeschen, Ninette Basque   Pre-operative Diagnosis: Symptomatic pulmonary emboli with right heart strain and hypoxia  Post-operative diagnosis:  Same  Procedure(s) Performed:  1.  Contrast injection right heart and bilateral pulmonary arteries  2.  Mechanical thrombectomy bilateral lobar pulmonary arteries for removal of pulmonary emboli using the Penumbra CAT 8 thrombectomy catheter.  3.  Selective catheter placement right upper lobe pulmonary artery, middle lobe pulmonary artery and lower lobe pulmonary artery  4.  Selective catheter placement left upper lobe pulmonary artery and lower lobe pulmonary artery    Anesthesia: Conscious sedation was administered under my direct supervision by the interventional radiology RN. IV Versed plus fentanyl  were utilized. Continuous ECG, pulse oximetry and blood pressure was monitored throughout the entire procedure.  Versed and fentanyl  were administered intravenously.  Conscious sedation was administered for a total of 41 minutes.  Sheath: 9 French Pinnacle sheath right common femoral vein antegrade  Contrast: 35 cc   Fluoroscopy Time: 14.7 minutes  Indications:  Patient presents with pulmonary emboli. The patient is symptomatic with hypoxemia and dyspnea on exertion.  There is evidence of right heart strain on the CT angiogram. The patient is otherwise a good candidate for intervention and even the long-term benefits pulmonary angiography with thrombolysis is offered. The risks and benefits are reviewed long-term benefits are discussed. All questions are answered patient agrees to proceed.  Procedure:  Rachel Harris a 89 y.o. female who was identified and appropriate procedural time out was performed.  The patient was then placed supine on the table and prepped and draped in the usual sterile fashion.  Ultrasound was  used to evaluate the right common femoral vein.  It was patent, as it was echolucent and compressible.  A digital ultrasound image was acquired for the permanent record.  A micropuncture needle was used to access the right common femoral vein under direct ultrasound guidance.  A microwire was then advanced under fluoroscopic guidance followed by micro-sheath.  A 0.035 J wire was advanced without resistance and a 5Fr sheath was placed.  Perclose devices were then used in a preclose fashion and then upsized to an 9 [place] sheath.    The wire and pigtail catheter were then negotiated into the right atrium and bolus injection of contrast was utilized to demonstrate the right ventricle and the pulmonary artery outflow.   4000 units of heparin was then given and allowed to circulate.  The J-wire and pigtail catheter was advanced up to the right atrium where a bolus injection contrast was used to demonstrate the pulmonary artery outflow.  Stiff angled Glidewire was then exchanged for the J-wire and the pigtail catheter was used to select the pulmonary outflow track.  The left main pulmonary artery was evaluated first.  The pigtail catheter was then advanced into the left main pulmonary artery.  Then with the catheter in the left main pulmonary artery bolus injection contrast was utilized to demonstrate the thrombus as well as the segmental pulmonary artery vasculature. This demonstrated thrombus in the distal left main pulmonary artery extending into the left upper lobe artery as well as the left lower lobe artery and noting that it was near occlusive.    The Penumbra Cat 8 extra torque catheter was then advanced into the thrombus in the left lower lobe pulmonary artery.  Hand-injection contrast was used to verify the positioning and evaluate the distal anatomy.  Mechanical aspiration was performed using the CAT 8 catheter and a separator.  Once there was free flow of blood from the lobar arteries the catheter  was repositioned to the left main pulmonary artery.  Hand-injection contrast was then performed to give an assessment of the left pulmonary vasculature and the effectiveness of thrombectomy.  Satisfied with the thrombectomy on the left, I then used the penumbra CAT 8 device as well as a Glidewire and an angled catheter to select the right main pulmonary artery  The Penumbra Cat 8 extra torque catheter was then advanced into the thrombus in the right lower lobe pulmonary artery.  Hand-injection contrast was used to verify positioning and evaluate the distal anatomy.  Mechanical aspiration was performed using the CAT 8 catheter and a separator.  After multiple passes the catheter was then repositioned into the right middle lobe pulmonary artery and again hand-injection contrast was performed to verify position and evaluate the distal anatomy.  Multiple passes were made using mechanical aspiration in association with a separator.  Lastly, using a combination of the separator catheter and Glidewire the CAT 8 device was negotiated into the right upper lobe pulmonary artery.  Hand-injection of contrast was used to verify positioning and evaluate the distal anatomy.  Multiple passes were then performed using the separator with the CAT 8 penumbra catheter.  Once there was free flow of blood from all 3 lobar arteries the catheter was repositioned to the right main pulmonary artery.  Hand-injection contrast was then used to give an assessment of the effectiveness of thrombectomy.  Pigtail catheter was then reintroduced over the Amplatz wire after removing the penumbra catheter and positioned in the pulmonary outflow tract.  A bolus injection of contrast was then used to create a final image of the pulmonary vasculature.  After review these images the catheter and sheath were removed and pressure held. There were no immediate complications.    Findings:   Right heart imaging:  Right atrium and right ventricle and  the pulmonary outflow tract appears normal  Right lung: The initial images of the right lung demonstrate thrombus within the distal right main pulmonary artery extending into the right upper, middle and lower lobar arteries.  There is thrombus extending into the segmental branches as well.  Following thrombectomy there appears to be near total resolution of the previously identified thrombus.  Left lung:  The initial images of the left lung demonstrate thrombus within the distal left main pulmonary artery extending into the left lower lobar arteries.  There is thrombus extending into the segmental branches as well.  Following thrombectomy there appears to be near total resolution of the previously identified thrombus.    Disposition: Patient was taken to the recovery room in stable condition having tolerated the procedure well.  Rachel Harris 05/03/2024,1:28 PM

## 2024-05-04 DIAGNOSIS — I2699 Other pulmonary embolism without acute cor pulmonale: Secondary | ICD-10-CM | POA: Diagnosis not present

## 2024-05-04 MED ORDER — MELATONIN 5 MG PO TABS
5.0000 mg | ORAL_TABLET | Freq: Once | ORAL | Status: AC
  Administered 2024-05-04: 5 mg via ORAL
  Filled 2024-05-04: qty 1

## 2024-05-04 MED ORDER — ENOXAPARIN SODIUM 100 MG/ML IJ SOSY
1.0000 mg/kg | PREFILLED_SYRINGE | Freq: Two times a day (BID) | INTRAMUSCULAR | Status: DC
  Administered 2024-05-05: 82.5 mg via SUBCUTANEOUS
  Filled 2024-05-04 (×2): qty 1

## 2024-05-04 NOTE — Progress Notes (Signed)
      Daily Progress Note   Assessment/Planning:   POD #1 s/p Mech TE B PE  Transition to Eliquis  Ok to discharge from vascular access stand point  Subjective  - 1 Day Post-Op   Breathing betterwen   Objective   Vitals:   05/04/24 0110 05/04/24 0311 05/04/24 0716 05/04/24 1209  BP: (!) 125/54 108/63 132/73 139/79  Pulse: 93 76 94 84  Resp: 20 20 19 18   Temp: 99 F (37.2 C) 97.9 F (36.6 C) 98.1 F (36.7 C) 97.8 F (36.6 C)  TempSrc: Oral Oral Oral Oral  SpO2: 91% 94% 94% 94%  Weight:      Height:         Intake/Output Summary (Last 24 hours) at 05/04/2024 1231 Last data filed at 05/03/2024 1926 Gross per 24 hour  Intake 605 ml  Output 300 ml  Net 305 ml    PULM CTAB, sym exp, good air movt, only on minimal [place] oxygen  VASC R groin: no hematoma    Laboratory   CBC    Latest Ref Rng & Units 05/02/2024    5:39 AM 05/01/2024    5:57 AM 04/28/2024    5:59 PM  CBC  WBC 4.0 - 10.5 K/uL 6.5  7.1  8.3   Hemoglobin 12.0 - 15.0 g/dL 09.8  11.9  14.7   Hematocrit 36.0 - 46.0 % 35.5  36.8  42.1   Platelets 150 - 400 K/uL 179  162  182     BMET    Component Value Date/Time   NA 135 05/02/2024 0539   K 3.5 05/02/2024 0539   CL 101 05/02/2024 0539   CO2 24 05/02/2024 0539   GLUCOSE 97 05/02/2024 0539   BUN 9 05/02/2024 0539   CREATININE 0.65 05/02/2024 0539   CALCIUM 8.1 (L) 05/02/2024 0539   GFRNONAA >60 05/02/2024 0539   GFRAA >60 06/04/2020 0458     Roxy Cordial, MD, FACS, FSVS Covering for  Vascular and Vein Surgery: [phone]  05/04/2024, 12:31 PM

## 2024-05-04 NOTE — Progress Notes (Signed)
 Progress Note   Patient: Rachel Harris ZOX:[id number] DOB: 11-Aug-1935 DOA: 05/01/2024     2 DOS: the patient was seen and examined on 05/04/2024      Brief hospital course: "HPI: TATIANAH Harris is a 89 y.o. female with medical history significant of GERD, hypertension, chronic estradiol use presenting with shortness of breath and PE.  Patient reports shortness of breath over multiple weeks.  Cannot fully specify how long this been going on.  Positive generalized increased work of breathing.  This is acutely worsened over the past 1 to 2 days.  No fevers or chills.  Minimal to mild cough with deep breathing.  Was recently evaluated in the outpatient setting for shingles.  Has completed course of steroids as well as antivirals with resolution of symptoms.  Mild abdominal pain.  No diarrhea or dysuria.  No focal hemiparesis or confusion.  No headache.  No reported alcohol or tobacco use.  Has been using estradiol chronically.  Denies any known history of PEs or blood clots in the past.  No reported recent extended trips. Presenting to the ER afebrile, hemodynamically stable.  Satting in low 90s on room air.  White count 7.1, hemoglobin 12.8, platelets 162, creatinine 0.9.  COVID flu and RSV negative.  CT of the chest showing moderate to large volume acute occlusive and nonocclusive PE extending from bilateral lobar pulmonary arteries bilaterally's.  No reported heart strain.  Noted groundglass opacities in bilateral lungs though nonspecific.  EKG normal sinus rhythm.:  "   Assessment and Plan:   Pulmonary embolism (HCC) Shortness of breath Acute shortness of breath with pleuritic chest pain over multiple days CT of the chest noted for moderate-to-large volume acute occlusive and nonocclusive pulmonary embolism extending from bilateral lobar pulmonary artery branches up to the subsegmental pulmonary artery branches throughout bilateral lungs without heart strain Vascular surgeon took patient for  pulmonary thrombectomy on 05/03/2024 Plan of care discussed with vascular surgery Continue current supplemental oxygen We will transition to Eliquis  at discharge According to patient vascular surgery team okay with discharge from tomorrow     History of shingles Recent episode of shingles status post course of prednisone and acyclovir Resolved   Hypertension BP stable Continue home regimen   GERD (gastroesophageal reflux disease) Continue PPI      Advance Care Planning:   Code Status: Limited: Do not attempt resuscitation (DNR) -DNR-LIMITED -Do Not Intubate/DNI     Consults: Vascular surgery   Family Communication: Family at the bedside      Subjective:  Patient seen and examined at bedside this morning Denies nausea vomiting Patient desaturated this down Oxygen is being weaned down as tolerated Currently on 2 L    Physical Exam:     Appearance: She is normal weight.  HENT:     Head: Normocephalic and atraumatic.     Nose: Nose normal.     Mouth/Throat:     Mouth: Mucous membranes are moist.  Eyes:     Pupils: Pupils are equal, round, and reactive to light.  Cardiovascular:     Rate and Rhythm: Normal rate and regular rhythm.  Pulmonary:     Effort: Pulmonary effort is normal.  Abdominal:     General: Bowel sounds are normal.  Musculoskeletal:        General: Normal range of motion.  Skin:    General: Skin is warm.  Neurological:     General: No focal deficit present.  Psychiatric:  Mood and Affect: Mood normal.    Data Reviewed:   I have reviewed patient labs imaging showing findings of pulmonary embolism    Vitals:   05/04/24 0311 05/04/24 0716 05/04/24 1209 05/04/24 1540  BP: 108/63 132/73 139/79 117/70  Pulse: 76 94 84 87  Resp: 20 19 18 17   Temp: 97.9 F (36.6 C) 98.1 F (36.7 C) 97.8 F (36.6 C) (!) 97.3 F (36.3 C)  TempSrc: Oral Oral Oral Oral  SpO2: 94% 94% 94% 93%  Weight:      Height:          Latest Ref Rng & Units  05/02/2024    5:39 AM 05/01/2024    5:57 AM 04/28/2024    5:59 PM  CBC  WBC 4.0 - 10.5 K/uL 6.5  7.1  8.3   Hemoglobin 12.0 - 15.0 g/dL 54.0  98.1  19.1   Hematocrit 36.0 - 46.0 % 35.5  36.8  42.1   Platelets 150 - 400 K/uL 179  162  182        Latest Ref Rng & Units 05/02/2024    5:39 AM 05/01/2024    5:57 AM 04/28/2024    5:59 PM  BMP  Glucose 70 - 99 mg/dL 97  478  295   BUN 8 - 23 mg/dL 9  11  17    Creatinine 0.44 - 1.00 mg/dL 6.21  3.08  6.57   Sodium 135 - 145 mmol/L 135  130  132   Potassium 3.5 - 5.1 mmol/L 3.5  3.4  4.2   Chloride 98 - 111 mmol/L 101  98  103   CO2 22 - 32 mmol/L 24  25  26    Calcium 8.9 - 10.3 mg/dL 8.1  8.2  7.9      Author: Ezzard Holms, MD 05/04/2024 5:16 PM  For on call review [email or web address].

## 2024-05-04 NOTE — Progress Notes (Signed)
 Pt requesting a sleeping aid, provider notified, order for melatonin place. Plan of care continued.

## 2024-05-04 NOTE — Plan of Care (Signed)

## 2024-05-04 NOTE — TOC Progression Note (Signed)
 Transition of Care [facility]) - Progression Note    Patient Details  Name: Rachel Harris MRN: [id number] Date of Birth: 1935-03-10  [facility] CM/SW Contact  Augustus Ledger Audry Blinks, RN Phone Number: 05/04/2024, 12:47 PM  Clinical Narrative:     [facility] RNCM attempted outreach cll to the pt's daughter DPR Mont Antis [phone]. RNCM only able to leave a HIPAA voice message requesting a call back for choice on the recommended HHPT agency.   TOC will continue to follow up for pending services for HHPT.       Expected Discharge Plan and Services         Expected Discharge Date: 05/02/24                                     Social Determinants of Health (SDOH) Interventions SDOH Screenings   Food Insecurity: No Food Insecurity (05/01/2024)  Housing: Low Risk  (05/01/2024)  Transportation Needs: No Transportation Needs (05/01/2024)  Utilities: Not At Risk (05/01/2024)  Financial Resource Strain: Low Risk  (04/24/2024)   Received from [facility]  Social Connections: Moderately Integrated (05/01/2024)  Tobacco Use: Low Risk  (05/01/2024)    Readmission Risk Interventions     No data to display

## 2024-05-04 NOTE — TOC Transition Note (Addendum)
 Transition of Care [facility]) - Discharge Note   Patient Details  Name: Rachel Harris MRN: [id number] Date of Birth: [birth year]  [facility]) CM/SW Contact:  Rendell Carrel, RN Phone Number: 05/04/2024, 1:04 PM   Clinical Narrative:    Spoke with daughter  Mont Antis [phone] concerning discharged disposition recommendations for HHPT with [email or web address] for choice. Daughter accepted [facility]) for HHPT for pt post hospital discharge. No other request or needs at this time.  TOC remains available for any other needs presented. Final next level of care: Home w Home Health Services Barriers to Discharge: No Barriers Identified   Patient Goals and CMS Choice   CMS Medicare.gov Compare Post Acute Care list provided to:: Patient Represenative (must comment) Choice offered to / list presented to : Adult Children [place] ownership interest in [facility].provided to:: Adult Children    Discharge Placement                  Name of family member notified: Mont Antis Patient and family notified of of transfer: 05/04/24  Discharge Plan and Services Additional resources added to the After Visit Summary for                            [facility] Arranged: PT [facility] Agency: [facility] Services Date [facility] Agency Contacted: 05/04/24 Time HH Agency Contacted: [phone] Representative spoke with at [facility] Agency: [facility]  Social Drivers of Health (SDOH) Interventions SDOH Screenings   Food Insecurity: No Food Insecurity (05/01/2024)  Housing: Low Risk  (05/01/2024)  Transportation Needs: No Transportation Needs (05/01/2024)  Utilities: Not At Risk (05/01/2024)  Financial Resource Strain: Low Risk  (04/24/2024)   Received from [facility]  Social Connections: Moderately Integrated (05/01/2024)  Tobacco Use: Low Risk  (05/01/2024)     Readmission Risk Interventions     No data to display

## 2024-05-05 DIAGNOSIS — I2699 Other pulmonary embolism without acute cor pulmonale: Secondary | ICD-10-CM | POA: Diagnosis not present

## 2024-05-05 LAB — CBC WITH DIFFERENTIAL/PLATELET
Abs Immature Granulocytes: 0.01 10*3/uL (ref 0.00–0.07)
Basophils Absolute: 0 10*3/uL (ref 0.0–0.1)
Basophils Relative: 1 %
Eosinophils Absolute: 0.7 10*3/uL — ABNORMAL HIGH (ref 0.0–0.5)
Eosinophils Relative: 14 %
HCT: 32.6 % — ABNORMAL LOW (ref 36.0–46.0)
Hemoglobin: 11.3 g/dL — ABNORMAL LOW (ref 12.0–15.0)
Immature Granulocytes: 0 %
Lymphocytes Relative: 25 %
Lymphs Abs: 1.2 10*3/uL (ref 0.7–4.0)
MCH: 32.2 pg (ref 26.0–34.0)
MCHC: 34.7 g/dL (ref 30.0–36.0)
MCV: 92.9 fL (ref 80.0–100.0)
Monocytes Absolute: 0.6 10*3/uL (ref 0.1–1.0)
Monocytes Relative: 11 %
Neutro Abs: 2.3 10*3/uL (ref 1.7–7.7)
Neutrophils Relative %: 49 %
Platelets: 216 10*3/uL (ref 150–400)
RBC: 3.51 MIL/uL — ABNORMAL LOW (ref 3.87–5.11)
RDW: 13.2 % (ref 11.5–15.5)
WBC: 4.8 10*3/uL (ref 4.0–10.5)
nRBC: 0 % (ref 0.0–0.2)

## 2024-05-05 LAB — BASIC METABOLIC PANEL WITH GFR
Anion gap: 6 (ref 5–15)
BUN: 9 mg/dL (ref 8–23)
CO2: 25 mmol/L (ref 22–32)
Calcium: 8.1 mg/dL — ABNORMAL LOW (ref 8.9–10.3)
Chloride: 102 mmol/L (ref 98–111)
Creatinine, Ser: 0.68 mg/dL (ref 0.44–1.00)
GFR, Estimated: >60 mL/min (ref >60)
Glucose, Bld: 90 mg/dL (ref 70–99)
Potassium: 3.4 mmol/L — ABNORMAL LOW (ref 3.5–5.1)
Sodium: 133 mmol/L — ABNORMAL LOW (ref 135–145)

## 2024-05-05 MED ORDER — APIXABAN (ELIQUIS) VTE STARTER PACK (10MG AND 5MG): ORAL_TABLET | ORAL | 0 refills | Status: DC

## 2024-05-05 MED ORDER — POTASSIUM CHLORIDE CRYS ER 20 MEQ PO TBCR
40.0000 meq | EXTENDED_RELEASE_TABLET | ORAL | Status: DC
  Administered 2024-05-05: 40 meq via ORAL
  Filled 2024-05-05: qty 2

## 2024-05-05 MED ORDER — APIXABAN 5 MG PO TABS: 5.0000 mg | ORAL_TABLET | Freq: Two times a day (BID) | ORAL | 4 refills | Status: AC

## 2024-05-05 NOTE — Care Management Important Message (Signed)
 Important Message  Patient Details  Name: Rachel Harris MRN: [id number] Date of Birth: July 24, 1935   Important Message Given:  Yes - Medicare IM     Anise Kerns 05/05/2024, 2:10 PM

## 2024-05-05 NOTE — Discharge Summary (Signed)
 Physician Discharge Summary   Patient: Rachel Harris MRN: [id number] DOB: 12-16-1935  Admit date:     05/01/2024  Discharge date: 05/05/24  Discharge Physician: Ezzard Holms   PCP: Yehuda Helms, MD   Recommendations at discharge:  Follow-up with PCP  Discharge Diagnoses: Pulmonary embolism (HCC) History of shingles Hypertension GERD (gastroesophageal reflux disease)   Hospital Course: Rachel Harris is a 89 y.o. female with medical history significant of GERD, hypertension, chronic estradiol use presenting with shortness of breath and PE.  Patient reports shortness of breath over multiple weeks.  Cannot fully specify how long this been going on.  Positive generalized increased work of breathing. COVID flu and RSV negative.  CT of the chest showing moderate to large volume acute occlusive and nonocclusive PE extending from bilateral lobar pulmonary arteries bilaterally's.  Patient was seen by vascular surgeon and underwent pulmonary thrombectomy.  Has now been weaned off oxygen and have been cleared for discharge today will follow-up with primary care physician.  Consultants: Vascular surgery Procedures performed: As mentioned above Disposition: Home health Diet recommendation:  Cardiac diet DISCHARGE MEDICATION: Allergies as of 05/05/2024       Reactions   Ciprofloxacin Rash   Amlodipine Swelling   Amoxicillin    Ceclor [cefaclor]    Codeine    Erythromycin    Levaquin [levofloxacin In D5w]    Macrodantin [nitrofurantoin Macrocrystal]    Zithromax [azithromycin]    Hydrochlorothiazide Rash   dehydation   Sulfa Antibiotics Rash        Medication List     STOP taking these medications    diltiazem 120 MG 24 hr capsule Commonly known as: CARDIZEM CD   estradiol 1 MG tablet Commonly known as: ESTRACE       TAKE these medications    acetaminophen  650 MG CR tablet Commonly known as: TYLENOL  Take 650-1,300 mg by mouth every 8 (eight) hours as needed for  pain.   Apixaban  Starter Pack (10mg  and 5mg ) Commonly known as: ELIQUIS  STARTER PACK Take as directed on package: start with two-5mg  tablets twice daily for 7 days. On day 8, switch to one-5mg  tablet twice daily.   apixaban  5 MG Tabs tablet Commonly known as: ELIQUIS  Take 1 tablet (5 mg total) by mouth 2 (two) times daily. Start taking on: June 02, 2024   escitalopram 10 MG tablet Commonly known as: LEXAPRO Take 10 mg by mouth daily.   ipratropium 0.03 % nasal spray Commonly known as: ATROVENT Place 2 sprays into both nostrils in the morning.   omeprazole 20 MG capsule Commonly known as: PRILOSEC Take 20 mg by mouth in the morning.   traMADol  50 MG tablet Commonly known as: ULTRAM  Take 1 tablet (50 mg total) by mouth as needed for severe pain (pain score 7-10).   valACYclovir 1000 MG tablet Commonly known as: VALTREX Take 1,000 mg by mouth 3 (three) times daily.       ASK your doctor about these medications    hydrOXYzine 25 MG tablet Commonly known as: ATARAX Take 25 mg by mouth 3 (three) times daily as needed for itching. Ask about: Should I take this medication?        Follow-up Information     Rachel Harris Come, MD Follow up.   Specialty: Internal Medicine Why: Hospital follow up Contact information: [address] [place] [address] [phone]                Discharge Exam: Rachel Harris Weights  05/01/24 0548  Weight: 83 kg    Appearance: She is normal weight.  HENT:     Head: Normocephalic and atraumatic.     Nose: Nose normal.     Mouth/Throat:     Mouth: Mucous membranes are moist.  Eyes:     Pupils: Pupils are equal, round, and reactive to light.  Cardiovascular:     Rate and Rhythm: Normal rate and regular rhythm.  Pulmonary:     Effort: Pulmonary effort is normal.  Abdominal:     General: Bowel sounds are normal.  Musculoskeletal:        General: Normal range of motion.  Skin:    General: Skin is warm.  Neurological:      General: No focal deficit present.  Psychiatric:        Mood and Affect: Mood normal.     Condition at discharge: good  The results of significant diagnostics from this hospitalization (including imaging, microbiology, ancillary and laboratory) are listed below for reference.   Imaging Studies: PERIPHERAL VASCULAR CATHETERIZATION Result Date: 05/03/2024 See surgical note for result.  US  Venous Img Lower Bilateral (DVT) Result Date: 05/01/2024 CLINICAL DATA:  Pulmonary embolus EXAM: BILATERAL LOWER EXTREMITY VENOUS DOPPLER ULTRASOUND TECHNIQUE: Gray-scale sonography with compression, as well as color and duplex ultrasound, were performed to evaluate the deep venous system(s) from the level of the common femoral vein through the popliteal and proximal calf veins. COMPARISON:  CT PA performed May 01, 2024 FINDINGS: VENOUS Right lower extremity: Right common femoral and femoral veins are patent. The right popliteal vein is partially compressible. A peroneal vein is also identified on the right which is partially compressible. Left lower extremity: Left common femoral, femoral, popliteal veins are patent. The left peroneal vein is non compressible. OTHER None. Limitations: none IMPRESSION: 1. Partially occlusive thrombus is favored within the right popliteal vein. A right peroneal vein thrombus is also favored. 2. Near occlusive thrombus is favored within a left peroneal vein. These results will be called to the ordering clinician or representative by the Radiologist Assistant, and communication documented in the PACS or [employer]. Electronically Signed   By: Reagan Camera M.D.   On: 05/01/2024 11:02   CT Angio Chest PE W/Cm &/Or Wo Cm Addendum Date: 05/01/2024 ADDENDUM REPORT: 05/01/2024 08:49 ADDENDUM: Critical Value/emergent results were called by telephone at the time of interpretation on 05/01/2024 at 8:35 am to Dr. Lind Repine, who verbally acknowledged these results. Electronically  Signed   By: Beula Brunswick M.D.   On: 05/01/2024 08:49   Result Date: 05/01/2024 CLINICAL DATA:  Pulmonary embolism (PE) suspected, high prob; Bowel obstruction suspected LLQ abdominal pain RLQ abdominal pain. EXAM: CT ANGIOGRAPHY CHEST CT ABDOMEN AND PELVIS WITH CONTRAST TECHNIQUE: Multidetector CT imaging of the chest was performed using the standard protocol during bolus administration of intravenous contrast. Multiplanar CT image reconstructions and MIPs were obtained to evaluate the vascular anatomy. Multidetector CT imaging of the abdomen and pelvis was performed using the standard protocol during bolus administration of intravenous contrast. RADIATION DOSE REDUCTION: This exam was performed according to the departmental dose-optimization program which includes automated exposure control, adjustment of the mA and/or kV according to patient size and/or use of iterative reconstruction technique. CONTRAST:  OMNIPAQUE  IOHEXOL  350 MG/ML SOLN COMPARISON:  CT angiography chest from 05/11/2021 and CT scan abdomen and pelvis from 01/24/2019. FINDINGS: CTA CHEST FINDINGS Cardiovascular: There is moderate-to-large volume acute occlusive and nonocclusive embolism extending from bilateral lobar pulmonary artery branches up  to the subsegmental pulmonary artery branches throughout bilateral lungs. No right heart strain. No frank lung infarction. Normal cardiac size. No pericardial effusion. No aortic aneurysm. There are coronary artery calcifications, in keeping with coronary artery disease. There are also mild peripheral atherosclerotic vascular calcifications of thoracic aorta and its major branches. Mediastinum/Nodes: Visualized thyroid  gland appears grossly unremarkable. No solid / cystic mediastinal masses. The esophagus is nondistended precluding optimal assessment. There are few mildly prominent mediastinal and hilar lymph nodes, which do not meet the size criteria for lymphadenopathy and appear mildly  decreased in size since the prior study, favoring benign etiology. No axillary lymphadenopathy by size criteria. Lungs/Pleura: The central tracheo-bronchial tree is patent. There are diffuse patchy ground-glass opacities scattered throughout bilateral lungs, which is nonspecific. No mass or dense consolidation. No pleural effusion or pneumothorax. No suspicious lung nodules. Musculoskeletal: The visualized soft tissues of the chest wall are grossly unremarkable. No suspicious osseous lesions. There are mild multilevel degenerative changes in the visualized spine. Review of the MIP images confirms the above findings. CT ABDOMEN and PELVIS FINDINGS Hepatobiliary: The liver is normal in size. Non-cirrhotic configuration. No suspicious mass. No intrahepatic or extrahepatic bile duct dilation. Gallbladder is surgically absent. Pancreas: Unremarkable. No pancreatic ductal dilatation or surrounding inflammatory changes. Spleen: Within normal limits. No focal lesion. Adrenals/Urinary Tract: Adrenal glands are unremarkable. No suspicious renal mass. There is a partially exophytic 6.1 x 6.4 cm cyst arising from the right kidney interpolar region. There are additional smaller sinus cysts in the left kidney. No nephroureterolithiasis or obstructive uropathy. Unremarkable urinary bladder. Stomach/Bowel: There is a tiny sliding hiatal hernia. No disproportionate dilation of the small or large bowel loops. No evidence of abnormal bowel wall thickening or inflammatory changes. The appendix was not visualized; however there is no acute inflammatory process in the right lower quadrant. There are multiple diverticula mainly in the sigmoid colon, without imaging signs of diverticulitis. Vascular/Lymphatic: No ascites or pneumoperitoneum. No abdominal or pelvic lymphadenopathy, by size criteria. No aneurysmal dilation of the major abdominal arteries. There are mild peripheral atherosclerotic vascular calcifications of the aorta and its  major branches. Reproductive: The uterus is surgically absent. There is a fluid attenuation 1.9 x 2.3 cm structure in the right adnexa, incompletely characterized on the current exam but favored to represent a senescent ovarian cyst. No significant interval change since the prior study. No other large adnexal mass. Other: Redemonstration of pelvic floor prolapse with rectocele, grossly similar to the prior study. There is a small fat containing umbilical hernia. The soft tissues and abdominal wall are otherwise unremarkable. Musculoskeletal: No suspicious osseous lesions. There are mild - moderate multilevel degenerative changes in the visualized spine. Review of the MIP images confirms the above findings. IMPRESSION: 1. There is moderate-to-large volume acute occlusive and nonocclusive pulmonary embolism extending from bilateral lobar pulmonary artery branches up to the subsegmental pulmonary artery branches throughout bilateral lungs. No right heart strain. No lung infarction. 2. No acute inflammatory process identified within the abdomen or pelvis. No bowel obstruction. 3. There are diffuse patchy ground-glass opacities scattered throughout bilateral lungs, which is nonspecific. Findings may represent sequela of infection or inflammation. Correlate clinically. 4. Multiple other nonacute observations, as described above. Aortic Atherosclerosis (ICD10-I70.0). Electronically Signed: By: Beula Brunswick M.D. On: 05/01/2024 08:30   CT ABDOMEN PELVIS W CONTRAST Addendum Date: 05/01/2024 ADDENDUM REPORT: 05/01/2024 08:49 ADDENDUM: Critical Value/emergent results were called by telephone at the time of interpretation on 05/01/2024 at 8:35 am to Dr. Lind Repine,  who verbally acknowledged these results. Electronically Signed   By: Beula Brunswick M.D.   On: 05/01/2024 08:49   Result Date: 05/01/2024 CLINICAL DATA:  Pulmonary embolism (PE) suspected, high prob; Bowel obstruction suspected LLQ abdominal pain RLQ  abdominal pain. EXAM: CT ANGIOGRAPHY CHEST CT ABDOMEN AND PELVIS WITH CONTRAST TECHNIQUE: Multidetector CT imaging of the chest was performed using the standard protocol during bolus administration of intravenous contrast. Multiplanar CT image reconstructions and MIPs were obtained to evaluate the vascular anatomy. Multidetector CT imaging of the abdomen and pelvis was performed using the standard protocol during bolus administration of intravenous contrast. RADIATION DOSE REDUCTION: This exam was performed according to the departmental dose-optimization program which includes automated exposure control, adjustment of the mA and/or kV according to patient size and/or use of iterative reconstruction technique. CONTRAST:  OMNIPAQUE  IOHEXOL  350 MG/ML SOLN COMPARISON:  CT angiography chest from 05/11/2021 and CT scan abdomen and pelvis from 01/24/2019. FINDINGS: CTA CHEST FINDINGS Cardiovascular: There is moderate-to-large volume acute occlusive and nonocclusive embolism extending from bilateral lobar pulmonary artery branches up to the subsegmental pulmonary artery branches throughout bilateral lungs. No right heart strain. No frank lung infarction. Normal cardiac size. No pericardial effusion. No aortic aneurysm. There are coronary artery calcifications, in keeping with coronary artery disease. There are also mild peripheral atherosclerotic vascular calcifications of thoracic aorta and its major branches. Mediastinum/Nodes: Visualized thyroid  gland appears grossly unremarkable. No solid / cystic mediastinal masses. The esophagus is nondistended precluding optimal assessment. There are few mildly prominent mediastinal and hilar lymph nodes, which do not meet the size criteria for lymphadenopathy and appear mildly decreased in size since the prior study, favoring benign etiology. No axillary lymphadenopathy by size criteria. Lungs/Pleura: The central tracheo-bronchial tree is patent. There are diffuse patchy  ground-glass opacities scattered throughout bilateral lungs, which is nonspecific. No mass or dense consolidation. No pleural effusion or pneumothorax. No suspicious lung nodules. Musculoskeletal: The visualized soft tissues of the chest wall are grossly unremarkable. No suspicious osseous lesions. There are mild multilevel degenerative changes in the visualized spine. Review of the MIP images confirms the above findings. CT ABDOMEN and PELVIS FINDINGS Hepatobiliary: The liver is normal in size. Non-cirrhotic configuration. No suspicious mass. No intrahepatic or extrahepatic bile duct dilation. Gallbladder is surgically absent. Pancreas: Unremarkable. No pancreatic ductal dilatation or surrounding inflammatory changes. Spleen: Within normal limits. No focal lesion. Adrenals/Urinary Tract: Adrenal glands are unremarkable. No suspicious renal mass. There is a partially exophytic 6.1 x 6.4 cm cyst arising from the right kidney interpolar region. There are additional smaller sinus cysts in the left kidney. No nephroureterolithiasis or obstructive uropathy. Unremarkable urinary bladder. Stomach/Bowel: There is a tiny sliding hiatal hernia. No disproportionate dilation of the small or large bowel loops. No evidence of abnormal bowel wall thickening or inflammatory changes. The appendix was not visualized; however there is no acute inflammatory process in the right lower quadrant. There are multiple diverticula mainly in the sigmoid colon, without imaging signs of diverticulitis. Vascular/Lymphatic: No ascites or pneumoperitoneum. No abdominal or pelvic lymphadenopathy, by size criteria. No aneurysmal dilation of the major abdominal arteries. There are mild peripheral atherosclerotic vascular calcifications of the aorta and its major branches. Reproductive: The uterus is surgically absent. There is a fluid attenuation 1.9 x 2.3 cm structure in the right adnexa, incompletely characterized on the current exam but favored to  represent a senescent ovarian cyst. No significant interval change since the prior study. No other large adnexal mass. Other: Redemonstration of pelvic  floor prolapse with rectocele, grossly similar to the prior study. There is a small fat containing umbilical hernia. The soft tissues and abdominal wall are otherwise unremarkable. Musculoskeletal: No suspicious osseous lesions. There are mild - moderate multilevel degenerative changes in the visualized spine. Review of the MIP images confirms the above findings. IMPRESSION: 1. There is moderate-to-large volume acute occlusive and nonocclusive pulmonary embolism extending from bilateral lobar pulmonary artery branches up to the subsegmental pulmonary artery branches throughout bilateral lungs. No right heart strain. No lung infarction. 2. No acute inflammatory process identified within the abdomen or pelvis. No bowel obstruction. 3. There are diffuse patchy ground-glass opacities scattered throughout bilateral lungs, which is nonspecific. Findings may represent sequela of infection or inflammation. Correlate clinically. 4. Multiple other nonacute observations, as described above. Aortic Atherosclerosis (ICD10-I70.0). Electronically Signed: By: Beula Brunswick M.D. On: 05/01/2024 08:30   DG Chest 1 View Result Date: 05/01/2024 CLINICAL DATA:  Infection workup.  Weakness. EXAM: CHEST  1 VIEW COMPARISON:  04/28/2024 FINDINGS: Heart size appears normal. Asymmetric elevation of right hemidiaphragm. Right lower lobe bronchial wall thickening is identified with new peribronchovascular opacities. No signs of pleural effusion or interstitial edema. Visualized osseous structures appear unremarkable. IMPRESSION: Right lower lobe bronchial wall thickening with new peribronchovascular opacities compatible with bronchitis and possible developing pneumonia. Electronically Signed   By: Kimberley Penman M.D.   On: 05/01/2024 06:44   DG Chest Portable 1 View Result Date:  04/28/2024 CLINICAL DATA:  Weakness EXAM: PORTABLE CHEST 1 VIEW COMPARISON:  06/21/2023 FINDINGS: Streaky atelectasis or scarring at the bases. No focal consolidation. Stable cardiomediastinal silhouette with aortic atherosclerosis. No pneumothorax IMPRESSION: Streaky atelectasis or scarring at the bases. Electronically Signed   By: Esmeralda Hedge M.D.   On: 04/28/2024 18:34    Microbiology: Results for orders placed or performed during the hospital encounter of 05/01/24  Resp panel by RT-PCR (RSV, Flu A&B, Covid) Anterior Nasal Swab     Status: None   Collection Time: 05/01/24  5:57 AM   Specimen: Anterior Nasal Swab  Result Value Ref Range Status   SARS Coronavirus 2 by RT PCR NEGATIVE NEGATIVE Final    Comment: (NOTE) SARS-CoV-2 target nucleic acids are NOT DETECTED.  The SARS-CoV-2 RNA is generally detectable in upper respiratory specimens during the acute phase of infection. The lowest concentration of SARS-CoV-2 viral copies this assay can detect is 138 copies/mL. A negative result does not preclude SARS-Cov-2 infection and should not be used as the sole basis for treatment or other patient management decisions. A negative result may occur with  improper specimen collection/handling, submission of specimen other than nasopharyngeal swab, presence of viral mutation(s) within the areas targeted by this assay, and inadequate number of viral copies(<138 copies/mL). A negative result must be combined with clinical observations, patient history, and epidemiological information. The expected result is Negative.  Fact Sheet for Patients:  BloggerCourse.com  Fact Sheet for Healthcare Providers:  SeriousBroker.it  This test is no t yet approved or cleared by the [place]  FDA and  has been authorized for detection and/or diagnosis of SARS-CoV-2 by FDA under an Emergency Use Authorization (EUA). This EUA will remain  in effect (meaning  this test can be used) for the duration of the COVID-19 declaration under Section 564(b)(1) of the Act, 21 U.S.C.section 360bbb-3(b)(1), unless the authorization is terminated  or revoked sooner.       Influenza A by PCR NEGATIVE NEGATIVE Final   Influenza B by PCR NEGATIVE NEGATIVE Final  Comment: (NOTE) The Xpert Xpress SARS-CoV-2/FLU/RSV plus assay is intended as an aid in the diagnosis of influenza from Nasopharyngeal swab specimens and should not be used as a sole basis for treatment. Nasal washings and aspirates are unacceptable for Xpert Xpress SARS-CoV-2/FLU/RSV testing.  Fact Sheet for Patients: BloggerCourse.com  Fact Sheet for Healthcare Providers: SeriousBroker.it  This test is not yet approved or cleared by the [place]  FDA and has been authorized for detection and/or diagnosis of SARS-CoV-2 by FDA under an Emergency Use Authorization (EUA). This EUA will remain in effect (meaning this test can be used) for the duration of the COVID-19 declaration under Section 564(b)(1) of the Act, 21 U.S.C. section 360bbb-3(b)(1), unless the authorization is terminated or revoked.     Resp Syncytial Virus by PCR NEGATIVE NEGATIVE Final    Comment: (NOTE) Fact Sheet for Patients: BloggerCourse.com  Fact Sheet for Healthcare Providers: SeriousBroker.it  This test is not yet approved or cleared by the [place]  FDA and has been authorized for detection and/or diagnosis of SARS-CoV-2 by FDA under an Emergency Use Authorization (EUA). This EUA will remain in effect (meaning this test can be used) for the duration of the COVID-19 declaration under Section 564(b)(1) of the Act, 21 U.S.C. section 360bbb-3(b)(1), unless the authorization is terminated or revoked.  Performed at [facility], [address] [place], [place] [address]     Labs: CBC: Recent  Labs  Lab 04/28/24 1759 05/01/24 0557 05/02/24 0539 05/05/24 0448  WBC 8.3 7.1 6.5 4.8  NEUTROABS 5.9 4.8  --  2.3  HGB 14.0 12.8 12.1 11.3*  HCT 42.1 36.8 35.5* 32.6*  MCV 96.3 92.5 93.9 92.9  PLT 182 162 179 216   Basic Metabolic Panel: Recent Labs  Lab 04/28/24 1759 05/01/24 0557 05/02/24 0539 05/05/24 0448  NA 132* 130* 135 133*  K 4.2 3.4* 3.5 3.4*  CL 103 98 101 102  CO2 26 25 24 25   GLUCOSE 128* 110* 97 90  BUN 17 11 9 9   CREATININE 0.88 0.91 0.65 0.68  CALCIUM 7.9* 8.2* 8.1* 8.1*  MG  --  1.9  --   --    Liver Function Tests: Recent Labs  Lab 04/28/24 1759 05/01/24 0557 05/02/24 0539  AST 22 23 16   ALT 19 19 16   ALKPHOS 78 76 73  BILITOT 0.7 0.8 0.8  PROT 7.0 6.9 6.3*  ALBUMIN 3.2* 3.0* 2.8*   CBG: Recent Labs  Lab 05/02/24 0602  GLUCAP 90    Discharge time spent:  35 minutes.  Signed: Ezzard Holms, MD [facility] 05/05/2024

## 2024-05-05 NOTE — Plan of Care (Signed)
  Problem: Clinical Measurements: Goal: Ability to maintain clinical measurements within normal limits will improve Outcome: Progressing Goal: Will remain free from infection Outcome: Progressing Goal: Respiratory complications will improve Outcome: Progressing Goal: Cardiovascular complication will be avoided Outcome: Progressing   Problem: Activity: Goal: Risk for activity intolerance will decrease Outcome: Progressing   Problem: Pain Managment: Goal: General experience of comfort will improve and/or be controlled Outcome: Progressing   Problem: Safety: Goal: Ability to remain free from injury will improve Outcome: Progressing   Problem: Elimination: Goal: Will not experience complications related to urinary retention Outcome: Progressing

## 2024-05-05 NOTE — Progress Notes (Signed)
 PT Cancellation Note  Patient Details Name: [name] MRN: [id number] DOB: 05/20/1935   Cancelled Treatment:     PT treatment not completed 2/2 to pt being D/C to home. Family present. PT discussed safety, goals with HHPT and educated the progression and expectation of Home health PT beyond acute care. Pt and family demonstrated good understanding. Pt requested to be deferred from PT Interventions.    [name] [name] 05/05/2024, 11:04 AM

## 2024-05-06 ENCOUNTER — Encounter: Payer: Self-pay | Admitting: Vascular Surgery

## 2024-05-07 ENCOUNTER — Telehealth (INDEPENDENT_AMBULATORY_CARE_PROVIDER_SITE_OTHER): Payer: Self-pay

## 2024-05-07 ENCOUNTER — Emergency Department

## 2024-05-07 ENCOUNTER — Observation Stay
Admission: EM | Admit: 2024-05-07 | Discharge: 2024-05-13 | Disposition: A | Discharge status: Home Health | Attending: Internal Medicine | Admitting: Internal Medicine

## 2024-05-07 ENCOUNTER — Other Ambulatory Visit: Payer: Self-pay

## 2024-05-07 ENCOUNTER — Encounter: Payer: Self-pay | Admitting: Emergency Medicine

## 2024-05-07 DIAGNOSIS — K5730 Diverticulosis of large intestine without perforation or abscess without bleeding: Secondary | ICD-10-CM | POA: Insufficient documentation

## 2024-05-07 DIAGNOSIS — K219 Gastro-esophageal reflux disease without esophagitis: Secondary | ICD-10-CM | POA: Diagnosis not present

## 2024-05-07 DIAGNOSIS — R112 Nausea with vomiting, unspecified: Secondary | ICD-10-CM | POA: Diagnosis not present

## 2024-05-07 DIAGNOSIS — I10 Essential (primary) hypertension: Secondary | ICD-10-CM | POA: Diagnosis not present

## 2024-05-07 DIAGNOSIS — R0609 Other forms of dyspnea: Principal | ICD-10-CM | POA: Diagnosis present

## 2024-05-07 DIAGNOSIS — Z9889 Other specified postprocedural states: Secondary | ICD-10-CM | POA: Diagnosis not present

## 2024-05-07 DIAGNOSIS — I2609 Other pulmonary embolism with acute cor pulmonale: Principal | ICD-10-CM

## 2024-05-07 DIAGNOSIS — F32A Depression, unspecified: Secondary | ICD-10-CM | POA: Diagnosis not present

## 2024-05-07 DIAGNOSIS — R2689 Other abnormalities of gait and mobility: Secondary | ICD-10-CM | POA: Insufficient documentation

## 2024-05-07 DIAGNOSIS — Z7901 Long term (current) use of anticoagulants: Secondary | ICD-10-CM | POA: Insufficient documentation

## 2024-05-07 DIAGNOSIS — R519 Headache, unspecified: Secondary | ICD-10-CM | POA: Diagnosis not present

## 2024-05-07 DIAGNOSIS — R531 Weakness: Secondary | ICD-10-CM | POA: Insufficient documentation

## 2024-05-07 DIAGNOSIS — K224 Dyskinesia of esophagus: Secondary | ICD-10-CM | POA: Insufficient documentation

## 2024-05-07 DIAGNOSIS — Z8619 Personal history of other infectious and parasitic diseases: Secondary | ICD-10-CM | POA: Diagnosis present

## 2024-05-07 DIAGNOSIS — I82413 Acute embolism and thrombosis of femoral vein, bilateral: Secondary | ICD-10-CM | POA: Diagnosis not present

## 2024-05-07 DIAGNOSIS — R0902 Hypoxemia: Secondary | ICD-10-CM

## 2024-05-07 LAB — CBC
HCT: 36.7 % (ref 36.0–46.0)
Hemoglobin: 12.4 g/dL (ref 12.0–15.0)
MCH: 31.4 pg (ref 26.0–34.0)
MCHC: 33.8 g/dL (ref 30.0–36.0)
MCV: 92.9 fL (ref 80.0–100.0)
Platelets: 279 10*3/uL (ref 150–400)
RBC: 3.95 MIL/uL (ref 3.87–5.11)
RDW: 13.3 % (ref 11.5–15.5)
WBC: 5.2 10*3/uL (ref 4.0–10.5)
nRBC: 0 % (ref 0.0–0.2)

## 2024-05-07 LAB — BASIC METABOLIC PANEL WITH GFR
Anion gap: 9 (ref 5–15)
BUN: 12 mg/dL (ref 8–23)
CO2: 24 mmol/L (ref 22–32)
Calcium: 8.3 mg/dL — ABNORMAL LOW (ref 8.9–10.3)
Chloride: 100 mmol/L (ref 98–111)
Creatinine, Ser: 0.67 mg/dL (ref 0.44–1.00)
GFR, Estimated: >60 mL/min (ref >60)
Glucose, Bld: 97 mg/dL (ref 70–99)
Potassium: 3.5 mmol/L (ref 3.5–5.1)
Sodium: 133 mmol/L — ABNORMAL LOW (ref 135–145)

## 2024-05-07 LAB — PROTIME-INR
INR: 1.4 — ABNORMAL HIGH (ref 0.8–1.2)
Prothrombin Time: 17.5 s — ABNORMAL HIGH (ref 11.4–15.2)

## 2024-05-07 LAB — BRAIN NATRIURETIC PEPTIDE: B Natriuretic Peptide: 88.4 pg/mL (ref 0.0–100.0)

## 2024-05-07 MED ORDER — ESCITALOPRAM OXALATE 10 MG PO TABS
10.0000 mg | ORAL_TABLET | Freq: Every day | ORAL | Status: DC
  Administered 2024-05-08 – 2024-05-09 (×2): 10 mg via ORAL
  Filled 2024-05-07 (×3): qty 1

## 2024-05-07 MED ORDER — PANTOPRAZOLE SODIUM 40 MG PO TBEC
40.0000 mg | DELAYED_RELEASE_TABLET | Freq: Every day | ORAL | Status: DC
  Administered 2024-05-08 – 2024-05-13 (×6): 40 mg via ORAL
  Filled 2024-05-07 (×6): qty 1

## 2024-05-07 MED ORDER — ACETAMINOPHEN 325 MG PO TABS
650.0000 mg | ORAL_TABLET | Freq: Four times a day (QID) | ORAL | Status: DC | PRN
  Administered 2024-05-07 – 2024-05-12 (×5): 650 mg via ORAL
  Filled 2024-05-07 (×5): qty 2

## 2024-05-07 MED ORDER — TRAMADOL HCL 50 MG PO TABS
50.0000 mg | ORAL_TABLET | Freq: Four times a day (QID) | ORAL | Status: DC | PRN
  Administered 2024-05-09: 50 mg via ORAL
  Filled 2024-05-07 (×2): qty 1

## 2024-05-07 MED ORDER — ONDANSETRON HCL 4 MG PO TABS: 4.0000 mg | ORAL_TABLET | Freq: Four times a day (QID) | ORAL | Status: DC | PRN

## 2024-05-07 MED ORDER — ACETAMINOPHEN 650 MG RE SUPP
650.0000 mg | Freq: Four times a day (QID) | RECTAL | Status: DC | PRN
Start: 2024-05-07 — End: 2024-05-13

## 2024-05-07 MED ORDER — ONDANSETRON HCL 4 MG/2ML IJ SOLN
4.0000 mg | Freq: Four times a day (QID) | INTRAMUSCULAR | Status: DC | PRN
  Administered 2024-05-10: 4 mg via INTRAVENOUS
  Filled 2024-05-07: qty 2

## 2024-05-07 MED ORDER — VALACYCLOVIR HCL 500 MG PO TABS: 1000.0000 mg | ORAL_TABLET | Freq: Three times a day (TID) | ORAL | Status: DC

## 2024-05-07 MED ORDER — APIXABAN 5 MG PO TABS
5.0000 mg | ORAL_TABLET | Freq: Two times a day (BID) | ORAL | Status: DC
  Administered 2024-05-13: 5 mg via ORAL
  Filled 2024-05-07: qty 1

## 2024-05-07 MED ORDER — APIXABAN 5 MG PO TABS
10.0000 mg | ORAL_TABLET | Freq: Two times a day (BID) | ORAL | Status: AC
  Administered 2024-05-07 – 2024-05-12 (×11): 10 mg via ORAL
  Filled 2024-05-07 (×11): qty 2

## 2024-05-07 NOTE — Assessment & Plan Note (Addendum)
 Acute respiratory failure with hypoxia History of large-volume PE 05/01/2024 s/p pulmonary thrombectomy Bilateral DVT 05/01/2024 Continue Eliquis  Supplemental oxygen with evaluation for home O2 Will get BNP Echocardiogram to evaluate for right heart strain(not present on CTA chest on 5/7)

## 2024-05-07 NOTE — Assessment & Plan Note (Signed)
 BP stable.

## 2024-05-07 NOTE — H&P (Signed)
 History and Physical    Patient: Rachel Harris GEX:[id number] DOB: 17-Jun-1935 DOA: 05/07/2024 DOS: the patient was seen and examined on 05/07/2024 PCP: Yehuda Helms, MD  Patient coming from: Home  Chief Complaint:  Chief Complaint  Patient presents with   Shortness of Breath    HPI: Rachel Harris is a 89 y.o. 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  Last recorded O2 sat of 88% on room air.  She denies chest pain, cough, fever or chills. ED course and data review: O2 sat 98% on 2 L with otherwise normal vitals Labs including CBC, BMP and INR all unremarkable EKG, personally read and interpreted showing sinus at 98 with PACs Chest x-ray showing low lung volumes with bronchovascular crowding and no focal consolidations  Patient was taken off oxygen while in the ED and maintain sats however after taking 3 steps she again desaturated to 88%.  She was placed back on oxygen.  Hospitalist consulted for admission.     Review of Systems: As mentioned in the history of present illness. All other systems reviewed and are negative.  Past Medical History:  Diagnosis Date   Anxiety    Barrett esophagus    Cataract cortical, senile    Degenerative disc disease, cervical    Depression    Diverticulosis    Environmental allergies    Exostosis    Fibrocystic breast disease    Fibrocystic breast disease    Gastritis    GERD (gastroesophageal reflux disease)    Hallux valgus    HH (hiatus hernia)    History of degenerative disc disease    History of shingles    Hypercholesterolemia    Hyperlipidemia    Hypertension    Internal hemorrhoids     Menopausal syndrome    Recurrent sinus infections    Vaginitis    Past Surgical History:  Procedure Laterality Date   ABDOMINAL HYSTERECTOMY     APPENDECTOMY     BREAST BIOPSY Left 2015   APOCRINE CYST   BREAST CYST ASPIRATION Left 2015   neg   BREAST SURGERY     biopsy   CATARACT EXTRACTION W/ INTRAOCULAR LENS IMPLANTW/ TRABECULECTOMY     CHOLECYSTECTOMY     COLONOSCOPY WITH PROPOFOL  N/A 08/25/2015   Procedure: COLONOSCOPY WITH PROPOFOL ;  Surgeon: Deveron Fly, MD;  Location: [facility] ENDOSCOPY;  Service: Endoscopy;  Laterality: N/A;   ESOPHAGOGASTRODUODENOSCOPY (EGD) WITH PROPOFOL  N/A 08/25/2015   Procedure: ESOPHAGOGASTRODUODENOSCOPY (EGD) WITH PROPOFOL ;  Surgeon: Deveron Fly, MD;  Location: [facility] ENDOSCOPY;  Service: Endoscopy;  Laterality: N/A;   ESOPHAGOGASTRODUODENOSCOPY (EGD) WITH PROPOFOL  N/A 02/20/2018   Procedure: ESOPHAGOGASTRODUODENOSCOPY (EGD) WITH PROPOFOL ;  Surgeon: Deveron Fly, MD;  Location: [facility] ENDOSCOPY;  Service: Endoscopy;  Laterality: N/A;   HERNIA REPAIR     LESION REMOVAL Left 04/19/2024   Procedure: EXCISION, LESION, NECK;  Surgeon: Conrado Delay, DO;  Location: [facility] ORS;  Service: General;  Laterality: Left;   NISSEN FUNDOPLICATION     PULMONARY THROMBECTOMY Bilateral 05/03/2024   Procedure: PULMONARY THROMBECTOMY;  Surgeon: Jackquelyn Mass, MD;  Location: [facility] INVASIVE CV LAB;  Service: Cardiovascular;  Laterality: Bilateral;   TONSILLECTOMY     Social History:  reports that  she has never smoked. She has never used smokeless tobacco. She reports that she does not drink alcohol and does not use drugs.  Allergies  Allergen Reactions   Ciprofloxacin Rash   Amlodipine Swelling   Amoxicillin    Ceclor [Cefaclor]    Codeine    Erythromycin    Levaquin [Levofloxacin In D5w]    Macrodantin [Nitrofurantoin Macrocrystal]    Zithromax [Azithromycin]    Hydrochlorothiazide Rash    dehydation   Sulfa Antibiotics Rash    Family History  Problem  Relation Age of Onset   Breast cancer Sister 27        2 sisters   Breast cancer Paternal Aunt    Breast cancer Other     Prior to Admission medications   Medication Sig Start Date End Date Taking? Authorizing Provider  acetaminophen  (TYLENOL ) 650 MG CR tablet Take 650-1,300 mg by mouth every 8 (eight) hours as needed for pain.    [provider]  apixaban  (ELIQUIS ) 5 MG TABS tablet Take 1 tablet (5 mg total) by mouth 2 (two) times daily. 06/02/24   Ezzard Holms, MD  APIXABAN  (ELIQUIS ) VTE STARTER PACK (10MG  AND 5MG ) Take as directed on package: start with two-5mg  tablets twice daily for 7 days. On day 8, switch to one-5mg  tablet twice daily. 05/05/24   Ezzard Holms, MD  escitalopram (LEXAPRO) 10 MG tablet Take 10 mg by mouth daily. 04/24/24 07/23/24  [provider]  ipratropium (ATROVENT) 0.03 % nasal spray Place 2 sprays into both nostrils in the morning.    [provider]  omeprazole (PRILOSEC) 20 MG capsule Take 20 mg by mouth in the morning.    [provider]  traMADol  (ULTRAM ) 50 MG tablet Take 1 tablet (50 mg total) by mouth as needed for severe pain (pain score 7-10). 04/19/24   Rosea Conch, Isami, DO  valACYclovir (VALTREX) 1000 MG tablet Take 1,000 mg by mouth 3 (three) times daily. 04/13/24   [provider]    Physical Exam: Vitals:   05/07/24 1840 05/07/24 1841 05/07/24 2030 05/07/24 2054  BP:   133/76   Pulse:   85   Resp:   18   Temp:      TempSrc:      SpO2:   92% 93%  Weight:  84.4 kg    Height: 5\' 6"  (1.676 m)      Physical Exam  Labs on Admission: I have personally reviewed following labs and imaging studies  CBC: Recent Labs  Lab 05/01/24 0557 05/02/24 0539 05/05/24 0448 05/07/24 1843  WBC 7.1 6.5 4.8 5.2  NEUTROABS 4.8  --  2.3  --   HGB 12.8 12.1 11.3* 12.4  HCT 36.8 35.5* 32.6* 36.7  MCV 92.5 93.9 92.9 92.9  PLT 162 179 216 279   Basic Metabolic Panel: Recent Labs  Lab 05/01/24 0557 05/02/24 0539  05/05/24 0448 05/07/24 1843  NA 130* 135 133* 133*  K 3.4* 3.5 3.4* 3.5  CL 98 101 102 100  CO2 25 24 25 24   GLUCOSE 110* 97 90 97  BUN 11 9 9 12   CREATININE 0.91 0.65 0.68 0.67  CALCIUM 8.2* 8.1* 8.1* 8.3*  MG 1.9  --   --   --    GFR: Estimated Creatinine Clearance: 55.2 mL/min (by C-G formula based on SCr of 0.67 mg/dL). Liver Function Tests: Recent Labs  Lab 05/01/24 0557 05/02/24 0539  AST 23 16  ALT 19 16  ALKPHOS 76 73  BILITOT 0.8 0.8  PROT 6.9 6.3*  ALBUMIN 3.0* 2.8*   No results for input(s): "LIPASE", "AMYLASE" in the last 168 hours. No results for input(s): "AMMONIA" in the last 168 hours. Coagulation Profile: Recent Labs  Lab 05/07/24 1843  INR 1.4*   Cardiac Enzymes: No results for input(s): "CKTOTAL", "CKMB", "CKMBINDEX", "TROPONINI" in the last 168 hours. BNP (last 3 results) No results for input(s): "PROBNP" in the last 8760 hours. HbA1C: No results for input(s): "HGBA1C" in the last 72 hours. CBG: Recent Labs  Lab 05/02/24 0602  GLUCAP 90   Lipid Profile: No results for input(s): "CHOL", "HDL", "LDLCALC", "TRIG", "CHOLHDL", "LDLDIRECT" in the last 72 hours. Thyroid  Function Tests: No results for input(s): "TSH", "T4TOTAL", "FREET4", "T3FREE", "THYROIDAB" in the last 72 hours. Anemia Panel: No results for input(s): "VITAMINB12", "FOLATE", "FERRITIN", "TIBC", "IRON", "RETICCTPCT" in the last 72 hours. Urine analysis:    Component Value Date/Time   COLORURINE YELLOW (A) 05/01/2024 0815   APPEARANCEUR HAZY (A) 05/01/2024 0815   APPEARANCEUR Cloudy (A) 08/31/2022 0958   LABSPEC 1.031 (H) 05/01/2024 0815   PHURINE 7.0 05/01/2024 0815   GLUCOSEU NEGATIVE 05/01/2024 0815   HGBUR NEGATIVE 05/01/2024 0815   BILIRUBINUR NEGATIVE 05/01/2024 0815   BILIRUBINUR Negative 08/31/2022 0958   KETONESUR 5 (A) 05/01/2024 0815   PROTEINUR NEGATIVE 05/01/2024 0815   NITRITE NEGATIVE 05/01/2024 0815   LEUKOCYTESUR NEGATIVE 05/01/2024 0815     Radiological Exams on Admission: DG Chest Port 1 View Result Date: 05/07/2024 CLINICAL DATA:  Shortness of breath EXAM: PORTABLE CHEST 1 VIEW COMPARISON:  Chest radiograph dated 05/01/2024 FINDINGS: Patient is rotated slightly to the right. Low lung volumes with bronchovascular crowding. No focal consolidations. No pleural effusion or pneumothorax. The heart size and mediastinal contours are within normal limits. No acute osseous abnormality. IMPRESSION: Low lung volumes with bronchovascular crowding. No focal consolidations. Electronically Signed   By: Limin  Xu M.D.   On: 05/07/2024 19:13   Data Reviewed for HPI: Relevant notes from primary care and specialist visits, past discharge summaries as available in EHR, including Care Everywhere. Prior diagnostic testing as pertinent to current admission diagnoses Updated medications and problem lists for reconciliation ED course, including vitals, labs, imaging, treatment and response to treatment Triage notes, nursing and pharmacy notes and ED provider's notes Notable results as noted above in HPI      Assessment and Plan: * Dyspnea on exertion Acute respiratory failure with hypoxia History of large-volume PE 05/01/2024 s/p pulmonary thrombectomy Bilateral DVT 05/01/2024 Continue Eliquis  Supplemental oxygen with evaluation for home O2 Will get BNP Echocardiogram to evaluate for right heart strain(not present on CTA chest on 5/7)  Depression Continue Lexapro  History of shingles Continue Valtrex thousand 3 times daily  Hypertension BP stable  GERD (gastroesophageal reflux disease) Protonix         DVT prophylaxis: Eliquis   Consults: none  Advance Care Planning:   Code Status: Prior   Family Communication: none  Disposition Plan: Back to previous home environment  Severity of Illness: The appropriate patient status for this patient is OBSERVATION. Observation status is judged to be reasonable and necessary in order  to provide the required intensity of service to ensure the patient's safety. The patient's presenting symptoms, physical exam findings, and initial radiographic and laboratory data in the context of their medical condition is felt to place them at decreased risk for further clinical deterioration. Furthermore, it is anticipated that the patient will be medically stable for discharge from the hospital within  2 midnights of admission.   Author: Lanetta Pion, MD 05/07/2024 10:32 PM  For on call review [email or web address].

## 2024-05-07 NOTE — Assessment & Plan Note (Signed)
-  Continue Lexapro

## 2024-05-07 NOTE — ED Provider Notes (Signed)
 [facility] Provider Note    Event Date/Time   First MD Initiated Contact with Patient 05/07/24 1933     (approximate)   History   Shortness of Breath   HPI  Rachel Harris is a 89 year old female with history of GERD, HTN, recently diagnosed PE presenting to the emergency department for evaluation of shortness of breath and weakness.  Patient was discharged in the hospital 2 days ago.  Since returning home she has noticed ongoing shortness of breath most notably with exertion.  Felt like her symptoms were worse today so she called EMS.  Was found to have a room air sat of 88%.  Denies fever, chest pain.  I reviewed her discharge summary from 05/05/2024.  At that time, patient presented with extensive bilateral pulmonary emboli for which she underwent pulmonary thrombectomy.  Weaned off of oxygen and cleared for discharge at that time.      Physical Exam   Triage Vital Signs: ED Triage Vitals  Encounter Vitals Group     BP 05/07/24 1838 123/71     Systolic BP Percentile --      Diastolic BP Percentile --      Pulse Rate 05/07/24 1838 94     Resp 05/07/24 1838 19     Temp 05/07/24 1838 98.7 F (37.1 C)     Temp Source 05/07/24 1838 Oral     SpO2 05/07/24 1838 98 %     Weight 05/07/24 1841 186 lb (84.4 kg)     Height 05/07/24 1840 5\' 6"  (1.676 m)     Head Circumference --      Peak Flow --      Pain Score 05/07/24 1840 0     Pain Loc --      Pain Education --      Exclude from Growth Chart --     Most recent vital signs: Vitals:   05/07/24 2200 05/07/24 2230  BP: (!) 113/56 (!) 113/58  Pulse: 80 81  Resp: 18 (!) 29  Temp:    SpO2: 98% 96%     General: Awake, interactive  CV:  Regular rate, good peripheral perfusion.  Resp:  Unlabored respirations, lungs clear to auscultation Abd:  Nondistended, soft, nontender Neuro:  Symmetric facial movement, fluid speech   ED Results / Procedures / Treatments   Labs (all labs ordered are  listed, but only abnormal results are displayed) Labs Reviewed  BASIC METABOLIC PANEL WITH GFR - Abnormal; Notable for the following components:      Result Value   Sodium 133 (*)    Calcium 8.3 (*)    All other components within normal limits  PROTIME-INR - Abnormal; Notable for the following components:   Prothrombin Time 17.5 (*)    INR 1.4 (*)    All other components within normal limits  CBC  BRAIN NATRIURETIC PEPTIDE     EKG EKG independently reviewed interpreted by myself (ER attending) demonstrates:  EKG demonstrates sinus rhythm rate of 98, PR 184, QRS 76, QTc 426, no acute ST changes  RADIOLOGY Imaging independently reviewed and interpreted by myself demonstrates:  CXR without focal consolidation  Formal Radiology Read:  [facility] Chest Port 1 View Result Date: 05/07/2024 CLINICAL DATA:  Shortness of breath EXAM: PORTABLE CHEST 1 VIEW COMPARISON:  Chest radiograph dated 05/01/2024 FINDINGS: Patient is rotated slightly to the right. Low lung volumes with bronchovascular crowding. No focal consolidations. No pleural effusion or pneumothorax. The heart size and mediastinal contours  are within normal limits. No acute osseous abnormality. IMPRESSION: Low lung volumes with bronchovascular crowding. No focal consolidations. Electronically Signed   By: Limin  Xu M.D.   On: 05/07/2024 19:13    PROCEDURES:  Critical Care performed: No  Procedures   MEDICATIONS ORDERED IN ED: Medications  traMADol  (ULTRAM ) tablet 50 mg (has no administration in time range)  valACYclovir (VALTREX) tablet 1,000 mg (has no administration in time range)  escitalopram (LEXAPRO) tablet 10 mg (has no administration in time range)  pantoprazole  (PROTONIX ) EC tablet 40 mg (has no administration in time range)  acetaminophen  (TYLENOL ) tablet 650 mg (has no administration in time range)    Or  acetaminophen  (TYLENOL ) suppository 650 mg (has no administration in time range)  ondansetron  (ZOFRAN ) tablet 4 mg  (has no administration in time range)    Or  ondansetron  (ZOFRAN ) injection 4 mg (has no administration in time range)     IMPRESSION / MDM / ASSESSMENT AND PLAN / ED COURSE  I reviewed the triage vital signs and the nursing notes.  Differential diagnosis includes, but is not limited to, ongoing shortness of breath related to recent extensive pulmonary emboli, pneumonia, pneumothorax, anemia, electrolyte abnormality  Patient's presentation is most consistent with acute presentation with potential threat to life or bodily function.  89 year old female presenting with shortness of breath and weakness.  Hypoxic at 88% prior to presentation, arrives on 2 L.  Labs overall reassuring.  INR 1.4, difficult to interpret in the setting of Eliquis  use.  X-Saleemah Mollenhauer without focal consolidation.  Did attempt to wean patient's oxygen, satting 92% on room air at rest, but rapidly desatted down to 88% with significant weakness with only a few steps.  In the setting of this, will reach out to hospitalist team. Clinical Course as of 05/07/24 2248  Tue May 07, 2024  2226 Case discussed with Dr. Vallarie Gauze. She will evaluate for anticipated admission.  [NR]    Clinical Course User Index [NR] [name], MD     FINAL CLINICAL IMPRESSION(S) / ED DIAGNOSES   Final diagnoses:  Acute pulmonary embolism with acute cor pulmonale, unspecified pulmonary embolism type (HCC)  Hypoxia  Generalized weakness     Rx / DC Orders   ED Discharge Orders     None        Note:  This document was prepared using Dragon voice recognition software and may include unintentional dictation errors.   [name], MD 05/07/24 [phone]

## 2024-05-07 NOTE — ED Triage Notes (Signed)
 Patient to ED via [facility] from home for SOB. Pt been SOB all day. Initially 88% on RA. Had blood clots removed from lungs 5 days ago. Patient reports having generalized weakness.   20g L AC 114 HR 95% 4L [place] 21 rr 143/98

## 2024-05-07 NOTE — ED Notes (Addendum)
 Attempted to ambulate pt. Upon standing pt's oxygen saturation decreased form 93% on room air to 90% on room air. After 2 steps pt's oxygen saturation decreased to 88% room air, pt had to be assisted back into bed due to feeling weak.  Pt placed on 2L [place] increasing oxygen saturation to 96%.  Denies any pain at this time. EDP made aware.

## 2024-05-07 NOTE — Assessment & Plan Note (Signed)
 Protonix.  ?

## 2024-05-07 NOTE — Assessment & Plan Note (Signed)
 Continue Valtrex thousand 3 times daily

## 2024-05-08 ENCOUNTER — Observation Stay: Admit: 2024-05-08

## 2024-05-08 ENCOUNTER — Observation Stay (HOSPITAL_BASED_OUTPATIENT_CLINIC_OR_DEPARTMENT_OTHER)
Admit: 2024-05-08 | Discharge: 2024-05-08 | Disposition: A | Discharge status: Home / Self Care | Attending: Internal Medicine | Admitting: Internal Medicine

## 2024-05-08 DIAGNOSIS — K219 Gastro-esophageal reflux disease without esophagitis: Secondary | ICD-10-CM

## 2024-05-08 DIAGNOSIS — I82413 Acute embolism and thrombosis of femoral vein, bilateral: Secondary | ICD-10-CM | POA: Diagnosis not present

## 2024-05-08 DIAGNOSIS — R0609 Other forms of dyspnea: Secondary | ICD-10-CM | POA: Diagnosis not present

## 2024-05-08 DIAGNOSIS — I10 Essential (primary) hypertension: Secondary | ICD-10-CM

## 2024-05-08 DIAGNOSIS — I2609 Other pulmonary embolism with acute cor pulmonale: Secondary | ICD-10-CM | POA: Diagnosis not present

## 2024-05-08 DIAGNOSIS — I2699 Other pulmonary embolism without acute cor pulmonale: Secondary | ICD-10-CM

## 2024-05-08 MED ORDER — POLYETHYLENE GLYCOL 3350 17 G PO PACK
17.0000 g | PACK | Freq: Every day | ORAL | Status: DC
  Administered 2024-05-08: 17 g via ORAL
  Filled 2024-05-08: qty 1

## 2024-05-08 MED ORDER — DOCUSATE SODIUM 100 MG PO CAPS
100.0000 mg | ORAL_CAPSULE | Freq: Two times a day (BID) | ORAL | Status: DC
  Administered 2024-05-08 (×2): 100 mg via ORAL
  Filled 2024-05-08 (×3): qty 1

## 2024-05-08 MED ORDER — BISACODYL 10 MG RE SUPP: 10.0000 mg | Freq: Once | RECTAL | Status: DC

## 2024-05-08 MED ORDER — SENNA 8.6 MG PO TABS
2.0000 | ORAL_TABLET | Freq: Every day | ORAL | Status: DC
  Administered 2024-05-08: 17.2 mg via ORAL
  Filled 2024-05-08: qty 2

## 2024-05-08 NOTE — Progress Notes (Signed)
 Progress Note   Patient: Rachel Harris NWG:[id number] DOB: 05/17/1935 DOA: 05/07/2024     0 DOS: the patient was seen and examined on 05/08/2024   Brief hospital course: Rachel Harris is a 89 y.o. 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   Assessment and Plan: * Dyspnea on exertion Acute respiratory failure with hypoxia History of large-volume PE 05/01/2024 s/p pulmonary thrombectomy Bilateral DVT 05/01/2024 Continue Eliquis  therapy. Continue supplemental oxygen to maintain saturation above 92%. Need to wean her off Oxygen and if not home O2 eval. BNP low. Echocardiogram pending to evaluate for right heart strain. She is weak, lives alone, may need rehab. Will get PT/ OT evaluation.  Depression Continue Lexapro.  Hypertension BP stable, she is not on antihypertensives.  GERD (gastroesophageal reflux disease) Continue Protonix      Out of bed to chair. Incentive spirometry. Nursing supportive care. Fall, aspiration precautions. Diet:  Diet Orders (From admission, onward)     Start     Ordered   05/07/24 2239  Diet Heart Room service appropriate? Yes; Fluid consistency: Thin  Diet effective now       Question Answer Comment  Room service appropriate? Yes   Fluid consistency: Thin      05/07/24 2239           DVT prophylaxis:  apixaban  (ELIQUIS ) tablet 10 mg  apixaban  (ELIQUIS ) tablet 5 mg  Level of care: Telemetry Medical   Code Status: Limited: Do not attempt resuscitation (DNR) -DNR-LIMITED -Do Not Intubate/DNI   Subjective: Patient is seen and examined today morning. She has shortness of breath. Feels weak. Eating poor. Did not get out of  bed.  Physical Exam: Vitals:   05/07/24 2352 05/08/24 0343 05/08/24 0732 05/08/24 1511  BP:  125/78 131/78 135/60  Pulse:  74 69 69  Resp:  20    Temp:  98 F (36.7 C) 98.3 F (36.8 C) 98.3 F (36.8 C)  TempSrc:  Oral Oral Oral  SpO2:  96% 98% 95%  Weight: 79.2 kg     Height: 5\' 6"  (1.676 m)       General - Elderly Caucasian female, distress due to weakness, shortness of breath. HEENT - PERRLA, EOMI, atraumatic head, non tender sinuses. Lung - Clear, basal rales, no rhonchi, wheezes. Heart - S1, S2 heard, no murmurs, rubs, trace pedal edema. Abdomen - Soft, non tender, bowel sounds good Neuro - Alert, awake and oriented x 3, non focal exam. Skin - Warm and dry.  Data Reviewed:      Latest Ref Rng & Units 05/07/2024    6:43 PM 05/05/2024    4:48 AM 05/02/2024    5:39 AM  CBC  WBC 4.0 - 10.5 K/uL 5.2  4.8  6.5   Hemoglobin 12.0 - 15.0 g/dL 57.8  46.9  62.9   Hematocrit 36.0 - 46.0 % 36.7  32.6  35.5   Platelets 150 - 400 K/uL 279  216  179       Latest Ref Rng & Units 05/07/2024    6:43 PM 05/05/2024    4:48 AM 05/02/2024    5:39 AM  BMP  Glucose 70 - 99 mg/dL 97  90  97   BUN 8 -  23 mg/dL 12  9  9    Creatinine 0.44 - 1.00 mg/dL 1.61  0.96  0.45   Sodium 135 - 145 mmol/L 133  133  135   Potassium 3.5 - 5.1 mmol/L 3.5  3.4  3.5   Chloride 98 - 111 mmol/L 100  102  101   CO2 22 - 32 mmol/L 24  25  24    Calcium 8.9 - 10.3 mg/dL 8.3  8.1  8.1    DG Chest Port 1 View Result Date: 05/07/2024 CLINICAL DATA:  Shortness of breath EXAM: PORTABLE CHEST 1 VIEW COMPARISON:  Chest radiograph dated 05/01/2024 FINDINGS: Patient is rotated slightly to the right. Low lung volumes with bronchovascular crowding. No focal consolidations. No pleural effusion or pneumothorax. The heart size and mediastinal contours are within normal limits. No acute osseous abnormality. IMPRESSION: Low lung volumes with bronchovascular crowding. No focal consolidations. Electronically Signed   By: Limin  Xu  M.D.   On: 05/07/2024 19:13    Family Communication: Discussed with patient, daughter. They understand and agree. All questions answered.  Disposition: Status is: Observation The patient remains OBS appropriate and will d/c before 2 midnights.  Planned Discharge Destination: Rehab pending PT/ OT eval     Time spent: 38 minutes  Author: Aisha Hove, MD 05/08/2024 4:06 PM Secure chat 7am to 7pm For on call review [email or web address].

## 2024-05-08 NOTE — Evaluation (Signed)
 Physical Therapy Evaluation Patient Details Name: Rachel Harris MRN: [id number] DOB: 08-12-1935 Today's Date: 05/08/2024  History of Present Illness  89 y.o. female presenting with shortness of breath and PE. PMH of GERD, hypertension, shingles, depression, chronic estradiol use, recent PE and BLE DVT s/p pulmonary thrombectomy 5/9.  Clinical Impression  Pt was pleasant and motivated but unsure about how much activity she could handle today.  She does not display overt SOB/DOE during session, but did have SpO2 drop <92% with 2-3 minute trial on room air and needed to defer further ambulation (did ~12ft) today 2/2 fatigue with slow and labored bout of ambulation.  Pt typically lives alone, drives, manages the home, etc and is far from her baseline.  Pt will benefit from continued PT to address functional limitations.        If plan is discharge home, recommend the following: A little help with walking and/or transfers;A little help with bathing/dressing/bathroom;Assist for transportation;Help with stairs or ramp for entrance   Can travel by private vehicle   Yes    Equipment Recommendations None recommended by PT  Recommendations for Other Services       Functional Status Assessment Patient has had a recent decline in their functional status and demonstrates the ability to make significant improvements in function in a reasonable and predictable amount of time.     Precautions / Restrictions Precautions Precautions: Fall Restrictions Weight Bearing Restrictions Per Provider Order: No      Mobility  Bed Mobility Overal bed mobility: Needs Assistance Bed Mobility: Supine to Sit     Supine to sit: Contact guard, Min assist     General bed mobility comments: slow and labored with effort toward EOB, VCs and minimal direct assist    Transfers Overall transfer level: Needs assistance Equipment used: Rolling walker (2 wheels) Transfers: Sit to/from Stand, Bed to  chair/wheelchair/BSC Sit to Stand: Contact guard assist, Min assist   Step pivot transfers: Contact guard assist       General transfer comment: heavy forward lean with heavy UE use, reports feeling weak but able to rise with w/o phyiscal assist    Ambulation/Gait Ambulation/Gait assistance: Contact guard assist Gait Distance (Feet): 70 Feet Assistive device: Rolling walker (2 wheels)         General Gait Details: Pt with very slow and walker reliant effort.  She was on 2L O2 t/o the effort with SpO2 staying in the mid 90s but pt becoming more and more noticably fatigued w/o excessive SOB.  Pt overall was safe but remains far from her baseline.  Stairs            Wheelchair Mobility     Tilt Bed    Modified Rankin (Stroke Patients Only)       Balance Overall balance assessment: Needs assistance Sitting-balance support: Feet supported Sitting balance-Leahy Scale: Good     Standing balance support: Bilateral upper extremity supported, Reliant on assistive device for balance Standing balance-Leahy Scale: Fair                               Pertinent Vitals/Pain Pain Assessment Pain Assessment: 0-10 Pain Score: 3  Pain Location: neck pain from being in bed    Home Living Family/patient expects to be discharged to:: Private residence Living Arrangements: Alone Available Help at Discharge: Family;Available PRN/intermittently Type of Home: Mobile home Home Access: Stairs to enter Entrance Stairs-Rails: Left Entrance Stairs-Number of Steps:  4   Home Layout: One level Home Equipment: Cane - single point;Hand held Programmer, systems (2 wheels);Rollator (4 wheels)      Prior Function Prior Level of Function : Independent/Modified Independent;Driving             Mobility Comments: prior to recent hospital admission 1-2 weeks pt was IND with no falls; now uses RW ADLs Comments: IND with ADL/IADLs, driving, grocery shopping; since last  week dtr has been checking in 1-2x/day to assist as needed d/t SOB     Extremity/Trunk Assessment   Upper Extremity Assessment Upper Extremity Assessment: Generalized weakness    Lower Extremity Assessment Lower Extremity Assessment: Generalized weakness       Communication   Communication Communication: No apparent difficulties    Cognition Arousal: Alert Behavior During Therapy: WFL for tasks assessed/performed                             Following commands: Intact       Cueing Cueing Techniques: Verbal cues     General Comments General comments (skin integrity, edema, etc.): Pt's SpO2 97% on arrival on 2L, after trial of room air of about 3 minutes w/o activity (minimal talking) down to 91%, reapplied O2 for remaineder of session with SpO2 generally staying mid 90s.    Exercises     Assessment/Plan    PT Assessment Patient needs continued PT services  PT Problem List Decreased strength;Decreased activity tolerance;Decreased balance;Decreased mobility       PT Treatment Interventions Gait training;Stair training;Functional mobility training;Therapeutic activities;Therapeutic exercise;Balance training;Neuromuscular re-education    PT Goals (Current goals can be found in the Care Plan section)  Acute Rehab PT Goals Patient Stated Goal: to return home. PT Goal Formulation: With patient/family Time For Goal Achievement: 05/21/24 Potential to Achieve Goals: Good    Frequency Min 2X/week     Co-evaluation               AM-PAC PT "6 Clicks" Mobility  Outcome Measure Help needed turning from your back to your side while in a flat bed without using bedrails?: A Little Help needed moving from lying on your back to sitting on the side of a flat bed without using bedrails?: A Little Help needed moving to and from a bed to a chair (including a wheelchair)?: A Little Help needed standing up from a chair using your arms (e.g., wheelchair or bedside  chair)?: A Little Help needed to walk in hospital room?: A Little Help needed climbing 3-5 steps with a railing? : A Lot 6 Click Score: 17    End of Session Equipment Utilized During Treatment: Gait belt Activity Tolerance: Patient tolerated treatment well Patient left: in bed;with call bell/phone within reach;with bed alarm set Nurse Communication: Mobility status PT Visit Diagnosis: Unsteadiness on feet (R26.81);Muscle weakness (generalized) (M62.81);Difficulty in walking, not elsewhere classified (R26.2)    Time: 9629-5284 PT Time Calculation (min) (ACUTE ONLY): 25 min   Charges:   PT Evaluation $PT Eval Low Complexity: 1 Low PT Treatments $Gait Training: 8-22 mins PT General Charges $$ ACUTE PT VISIT: 1 Visit         Darice Edelman, DPT 05/08/2024, 1:41 PM

## 2024-05-08 NOTE — TOC Initial Note (Signed)
 Transition of Care [facility]) - Initial/Assessment Note    Patient Details  Name: Rachel Harris MRN: [id number] Date of Birth: 1935-09-11  [facility]) CM/SW Contact:    Joslyn Nim, RN Phone Number: 05/08/2024, 2:29 PM  Clinical Narrative:                 Daughter Jenette Mitchell, ([phone]/( [phone], at bedside this morning. She states that her sister Shelvy Dickens will be here later today. CM asked if HH came to the home since patient's d/c on 5/11. Patient stated no. Patient currently is wearing oxygen. Patient does not have oxygen at home. Waiting on therapy to evaluate patient. Daughter asked about  rehab. CM plan to bring list from Medicare.gov website.  Daughter states that [patient has access to a rolliator and rolling walker     1335:  Daughter Shelvy Dickens at bedside. Patient working with therapy. CM provided list to daughter.   Expected Discharge Plan: Skilled Nursing Facility     Patient Goals and CMS Choice   CMS Medicare.gov Compare Post Acute Care list provided to:: Patient Represenative (must comment) (Daughter- Shelvy Dickens) Choice offered to / list presented to : Adult Children      Expected Discharge Plan and Services       Living arrangements for the past 2 months: Single Family Home                                      Prior Living Arrangements/Services Living arrangements for the past 2 months: Single Family Home Lives with:: Self Patient language and need for interpreter reviewed:: No            Current home services: DME    Activities of Daily Living   ADL Screening (condition at time of admission) Independently performs ADLs?: No Does the patient have a NEW difficulty with bathing/dressing/toileting/self-feeding that is expected to last >3 days?: Yes (Initiates electronic notice to provider for possible OT consult) Does the patient have a NEW difficulty with getting in/out of bed, walking, or climbing stairs that is expected to last >3  days?: Yes (Initiates electronic notice to provider for possible PT consult) Does the patient have a NEW difficulty with communication that is expected to last >3 days?: No Is the patient deaf or have difficulty hearing?: No Does the patient have difficulty seeing, even when wearing glasses/contacts?: No Does the patient have difficulty concentrating, remembering, or making decisions?: No  Permission Sought/Granted                  Emotional Assessment Appearance:: Appears stated age     Orientation: : Oriented to Self, Oriented to Place, Oriented to  Time, Oriented to Situation   Psych Involvement: No (comment)  Admission diagnosis:  Dyspnea on exertion [R06.09] Hypoxia [R09.02] Generalized weakness [R53.1] Acute pulmonary embolism with acute cor pulmonale, unspecified pulmonary embolism type (HCC) [I26.09] Patient Active Problem List   Diagnosis Date Noted   Depression 05/07/2024   Dyspnea on exertion 05/02/2024   Generalized weakness 05/02/2024   Acute deep vein thrombosis (DVT) of popliteal vein of both lower extremities (HCC) 05/02/2024   Pulmonary embolism (HCC) 05/01/2024   GERD (gastroesophageal reflux disease) 05/01/2024   Hypertension 05/01/2024   History of shingles 05/01/2024   PCP:  Yehuda Helms, MD Pharmacy:   [facility] - [place], [place] - [address] [place] [address] Phone: [phone] Fax: [phone]  [id number] - [place], [place] - [address] [place] [address] Phone: [phone] Fax: [phone]     Social Drivers of Health (SDOH) Social History: SDOH Screenings   Food Insecurity: No Food Insecurity (05/07/2024)  Housing: Low Risk  (05/07/2024)  Transportation Needs: No Transportation Needs (05/07/2024)  Utilities: Not At Risk (05/07/2024)  Financial Resource Strain: Low Risk  (04/24/2024)   Received from [facility]  Social Connections: Moderately Integrated  (05/07/2024)  Tobacco Use: Low Risk  (05/07/2024)   SDOH Interventions:     Readmission Risk Interventions     No data to display

## 2024-05-08 NOTE — Evaluation (Signed)
 Occupational Therapy Evaluation Patient Details Name: Rachel Harris MRN: [id number] DOB: 10-06-1935 Today's Date: 05/08/2024   History of Present Illness   Pt is a 89 y.o. female presenting with shortness of breath and PE. PMH of GERD, hypertension, shingles, depression, chronic estradiol use, recent PE and BLE DVT s/p pulmonary thrombectomy 5/9.     Clinical Impressions Pt was seen for OT evaluation this date. PTA, pt resides in a 1 level home with 4 STE and bil HR alone with her daughter nearby. Prior to ~1 week ago, pt was fully IND and did not use any AE/AD/DME for mobility or ADLs. She was discharged a few days ago from this hospital using a RW for mobility, but continues to experience increased SOB and weakness.   Pt presents to acute OT demonstrating impaired ADL performance and functional mobility 2/2 weakness and low activity tolerance. Pt currently requires Min A for bed mobility tasks with increased time and effort. Min/CGA for STS from EOB to RW and SPT to recliner. Pt's sp02 95-97% on 2L. Amb ~10 feet using RW to the bathroom requiring 1 standing rest break in order to safely make it to the toilet. CGA for toilet transfer using grab bars and Min A for clothing management. Sp02 94% on 2L, RR up to 27. Nurse techs in to assist pt with bathing from here. Pt very fatigued with mobility to the bathroom and anticipate Mod A for LB ADLs at this time. Pt would not be able to manage household chores and daughter is only available to check in 1-2x/day.  Pt would benefit from skilled OT services to address noted impairments and functional limitations to maximize safety and independence while minimizing falls risk and caregiver burden. Do anticipate the need for follow up OT services upon acute hospital DC.       If plan is discharge home, recommend the following:   A little help with walking and/or transfers;Assist for transportation;Help with stairs or ramp for entrance;A lot of help with  bathing/dressing/bathroom;Assistance with cooking/housework     Functional Status Assessment   Patient has had a recent decline in their functional status and demonstrates the ability to make significant improvements in function in a reasonable and predictable amount of time.     Equipment Recommendations   Other (comment);BSC/3in1;Tub/shower seat (defer)     Recommendations for Other Services         Precautions/Restrictions   Precautions Precautions: Fall Restrictions Weight Bearing Restrictions Per Provider Order: No     Mobility Bed Mobility Overal bed mobility: Needs Assistance Bed Mobility: Supine to Sit     Supine to sit: Min assist, HOB elevated, Used rails     General bed mobility comments: HHA to bring trunk fully upright; pt fatigues easily and states she gets hot; VSS    Transfers Overall transfer level: Needs assistance Equipment used: Rolling walker (2 wheels) Transfers: Sit to/from Stand, Bed to chair/wheelchair/BSC Sit to Stand: Contact guard assist, Min assist     Step pivot transfers: Contact guard assist     General transfer comment: min/CGA for STS from EOB at lowest height with increased time/effort, SPT to recliner; after seated RB ambulated ~10 feet to the bathroom using RW with CGA with sp02 94% on 2L; RR up to 27; NTs in to assist with bathing while in bathroom      Balance Overall balance assessment: Needs assistance Sitting-balance support: Feet supported Sitting balance-Leahy Scale: Good     Standing balance support: During functional  activity, Bilateral upper extremity supported, Reliant on assistive device for balance Standing balance-Leahy Scale: Fair Standing balance comment: CGA with RW, standing rest break                           ADL either performed or assessed with clinical judgement   ADL Overall ADL's : Needs assistance/impaired                 Upper Body Dressing : Contact guard  assist;Sitting       Toilet Transfer: Contact guard assist;Rolling walker (2 wheels);Grab bars   Toileting- Clothing Manipulation and Hygiene: Minimal assistance;Sit to/from stand       Functional mobility during ADLs: Contact guard assist;Rolling walker (2 wheels)       Vision         Perception         Praxis         Pertinent Vitals/Pain Pain Assessment Pain Assessment: Faces Faces Pain Scale: No hurt Pain Intervention(s): Monitored during session     Extremity/Trunk Assessment Upper Extremity Assessment Upper Extremity Assessment: Generalized weakness   Lower Extremity Assessment Lower Extremity Assessment: Generalized weakness       Communication Communication Communication: No apparent difficulties   Cognition Arousal: Alert Behavior During Therapy: WFL for tasks assessed/performed Cognition: No apparent impairments                               Following commands: Intact       Cueing  General Comments   Cueing Techniques: Verbal cues  maintained WFL-94% on 2L with short distance mobility, however is much weaker than last admission   Exercises Other Exercises Other Exercises: Edu on role of OT in acute setting, pacing and ECS during ADLs, transfers and mobility.   Shoulder Instructions      Home Living Family/patient expects to be discharged to:: Private residence Living Arrangements: Alone Available Help at Discharge: Family;Available PRN/intermittently (daughter 4 miles away) Type of Home: Mobile home Home Access: Stairs to enter Secretary/administrator of Steps: 4 Entrance Stairs-Rails: Left Home Layout: One level     Bathroom Shower/Tub: Chief Strategy Officer: Handicapped height Bathroom Accessibility: No   Home Equipment: Cane - single point;Hand held Programmer, systems (2 wheels)          Prior Functioning/Environment Prior Level of Function : Independent/Modified Independent;Driving              Mobility Comments: prior to recent hospital admission 1-2 weeks pt was IND with no falls; now uses RW ADLs Comments: IND with ADL/IADLs, driving, grocery shopping; since last week dtr has been checking in 1-2x/day to assist as needed d/t SOB    OT Problem List: Decreased activity tolerance;Decreased strength   OT Treatment/Interventions: Self-care/ADL training;Balance training;Therapeutic exercise;Therapeutic activities;Energy conservation;DME and/or AE instruction;Patient/family education      OT Goals(Current goals can be found in the care plan section)   Acute Rehab OT Goals Patient Stated Goal: improve strength/breathing OT Goal Formulation: With patient Time For Goal Achievement: 05/22/24 Potential to Achieve Goals: Fair ADL Goals Pt Will Perform Lower Body Bathing: with supervision;with modified independence;sitting/lateral leans;sit to/from stand Pt Will Perform Lower Body Dressing: with modified independence;sit to/from stand;sitting/lateral leans;with supervision Pt Will Transfer to Toilet: with modified independence;with supervision;ambulating;regular height toilet Additional ADL Goal #1: Pt will demo implementation of 1 leared ECS during ADL performance 2/2  trials to maximize IND/safety and prevent overexertion.   OT Frequency:  Min 2X/week    Co-evaluation              AM-PAC OT "6 Clicks" Daily Activity     Outcome Measure Help from another person eating meals?: None Help from another person taking care of personal grooming?: None Help from another person toileting, which includes using toliet, bedpan, or urinal?: A Little Help from another person bathing (including washing, rinsing, drying)?: A Lot Help from another person to put on and taking off regular upper body clothing?: A Little Help from another person to put on and taking off regular lower body clothing?: A Lot 6 Click Score: 18   End of Session Equipment Utilized During Treatment:  Rolling walker (2 wheels);Oxygen Nurse Communication: Mobility status  Activity Tolerance: Patient tolerated treatment well Patient left: in chair (on toilet with handoff to NT)  OT Visit Diagnosis: Other abnormalities of gait and mobility (R26.89);Muscle weakness (generalized) (M62.81)                Time: 1610-9604 OT Time Calculation (min): 28 min Charges:  OT General Charges $OT Visit: 1 Visit OT Evaluation $OT Eval Moderate Complexity: 1 Mod OT Treatments $Self Care/Home Management : 8-22 mins  Volanda Mangine, OTR/L 05/08/24, 1:29 PM  Samika Vetsch E Nakiya Rallis 05/08/2024, 1:23 PM

## 2024-05-08 NOTE — Care Management Important Message (Signed)
 Important Message  Patient Details  Name: Rachel Harris MRN: [id number] Date of Birth: May 01, 1935   Important Message Given:  Yes - Medicare IM     Anise Kerns 05/08/2024, 1:44 PM

## 2024-05-09 ENCOUNTER — Other Ambulatory Visit: Payer: Self-pay

## 2024-05-09 DIAGNOSIS — R0609 Other forms of dyspnea: Secondary | ICD-10-CM | POA: Diagnosis not present

## 2024-05-09 LAB — ECHOCARDIOGRAM COMPLETE
AR max vel: 2.37 cm2
AV Area VTI: 2.39 cm2
AV Area mean vel: 2.33 cm2
AV Mean grad: 5.3 mmHg
AV Peak grad: 9.3 mmHg
Ao pk vel: 1.53 m/s
Area-P 1/2: 2.52 cm2
Height: 66 in
S' Lateral: 2.3 cm
Weight: 2793.67 [oz_av]

## 2024-05-09 MED ORDER — POLYETHYLENE GLYCOL 3350 17 G PO PACK: 17.0000 g | PACK | Freq: Every day | ORAL | Status: DC | PRN

## 2024-05-09 MED ORDER — SENNA 8.6 MG PO TABS
2.0000 | ORAL_TABLET | Freq: Every evening | ORAL | Status: DC | PRN
  Administered 2024-05-13: 17.2 mg via ORAL
  Filled 2024-05-09: qty 2

## 2024-05-09 NOTE — Care Management Obs Status (Signed)
 MEDICARE OBSERVATION STATUS NOTIFICATION   Patient Details  Name: Rachel Harris MRN: [id number] Date of Birth: [birth year]   Medicare Observation Status Notification Given:  Yes    Anise Kerns 05/09/2024, 2:22 PM

## 2024-05-09 NOTE — NC FL2 (Signed)
 [place]  MEDICAID FL2 LEVEL OF CARE FORM     IDENTIFICATION  Patient Name: Rachel Harris Birthdate: [birth year] Sex: female Admission Date (Current Location): 05/07/2024  [facility] [place] Number:  Chiropodist and Address:         Provider Number: [id number]  Attending Physician Name and Address:  Montey Apa, DO  Relative Name and Phone Number:       Current Level of Care: Hospital Recommended Level of Care: Skilled Nursing Facility Prior Approval Number:    Date Approved/Denied:   PASRR Number: [id number]  Discharge Plan: SNF    Current Diagnoses: Patient Active Problem List   Diagnosis Date Noted   Depression 05/07/2024   Dyspnea on exertion 05/02/2024   Generalized weakness 05/02/2024   Acute deep vein thrombosis (DVT) of popliteal vein of both lower extremities (HCC) 05/02/2024   Pulmonary embolism (HCC) 05/01/2024   GERD (gastroesophageal reflux disease) 05/01/2024   Hypertension 05/01/2024   History of shingles 05/01/2024    Orientation RESPIRATION BLADDER Height & Weight     Self, Time, Situation, Place  O2 (2l [place]) Continent Weight: 79.2 kg Height:  5\' 6"  (167.6 cm)  BEHAVIORAL SYMPTOMS/MOOD NEUROLOGICAL BOWEL NUTRITION STATUS      Continent Diet (heart healthy)  AMBULATORY STATUS COMMUNICATION OF NEEDS Skin   Limited Assist Verbally Bruising                       Personal Care Assistance Level of Assistance              Functional Limitations Info             SPECIAL CARE FACTORS FREQUENCY  PT (By licensed PT), OT (By licensed OT)                    Contractures Contractures Info: Not present    Additional Factors Info  Code Status, Allergies Code Status Info: DNR Allergies Info: Ciprofloxacin, Amlodipine, Amoxicillin, Ceclor (Cefaclor), Codeine, Erythromycin, Levaquin (Levofloxacin In D5w), Macrodantin (Nitrofurantoin Macrocrystal), Zithromax (Azithromycin), Hydrochlorothiazide, Sulfa Antibiotics            Current Medications (05/09/2024):  This is the current hospital active medication list Current Facility-Administered Medications  Medication Dose Route Frequency Provider Last Rate Last Admin   acetaminophen  (TYLENOL ) tablet 650 mg  650 mg Oral Q6H PRN Duncan, Hazel V, MD   650 mg at 05/09/24 6213   Or   acetaminophen  (TYLENOL ) suppository 650 mg  650 mg Rectal Q6H PRN Duncan, Hazel V, MD       apixaban  (ELIQUIS ) tablet 10 mg  10 mg Oral BID Darus Engels A, DO   10 mg at 05/09/24 0865   Followed by   Cecily Cohen ON 05/13/2024] apixaban  (ELIQUIS ) tablet 5 mg  5 mg Oral BID Darus Engels A, DO       docusate sodium (COLACE) capsule 100 mg  100 mg Oral BID Sreeram, Narendranath, MD   100 mg at 05/08/24 2126   escitalopram (LEXAPRO) tablet 10 mg  10 mg Oral Daily Duncan, Hazel V, MD   10 mg at 05/09/24 7846   ondansetron  (ZOFRAN ) tablet 4 mg  4 mg Oral Q6H PRN Duncan, Hazel V, MD       Or   ondansetron  (ZOFRAN ) injection 4 mg  4 mg Intravenous Q6H PRN Lanetta Pion, MD       pantoprazole  (PROTONIX ) EC tablet 40 mg  40 mg Oral Daily Lanetta Pion,  MD   40 mg at 05/09/24 0837   polyethylene glycol (MIRALAX  / GLYCOLAX ) packet 17 g  17 g Oral Daily PRN Montey Apa, DO       senna (SENOKOT) tablet 17.2 mg  2 tablet Oral QHS PRN Darus Engels A, DO       traMADol  (ULTRAM ) tablet 50 mg  50 mg Oral Q6H PRN Duncan, Hazel V, MD         Discharge Medications: Please see discharge summary for a list of discharge medications.  Relevant Imaging Results:  Relevant Lab Results:   Additional Information ss [id number]  Loman Risk, RN

## 2024-05-09 NOTE — TOC Progression Note (Signed)
 Transition of Care [facility]) - Progression Note    Patient Details  Name: Rachel Harris MRN: [id number] Date of Birth: 04-01-1935  Transition of [facility]) CM/SW Contact  Loman Risk, RN Phone Number: 05/09/2024, 11:19 AM  Clinical Narrative:    Therapy recommending SNF PASRR obtained. FL2 sent for signature, bed search initiated     Expected Discharge Plan: Skilled Nursing Facility    Expected Discharge Plan and Services       Living arrangements for the past 2 months: Single Family Home                                       Social Determinants of Health (SDOH) Interventions SDOH Screenings   Food Insecurity: No Food Insecurity (05/07/2024)  Housing: Low Risk  (05/07/2024)  Transportation Needs: No Transportation Needs (05/07/2024)  Utilities: Not At Risk (05/07/2024)  Financial Resource Strain: Low Risk  (04/24/2024)   Received from [facility]  Social Connections: Moderately Integrated (05/07/2024)  Tobacco Use: Low Risk  (05/07/2024)    Readmission Risk Interventions     No data to display

## 2024-05-09 NOTE — Progress Notes (Signed)
  Progress Note   Patient: Rachel Harris ZOX:[id number] DOB: [birth year] DOA: 05/07/2024     0 DOS: the patient was seen and examined on 05/09/2024   Brief hospital course: "Rachel Harris is a 89 y.o. 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 "   Further hospital course and management as outlined below.   Assessment and Plan:  * Dyspnea on exertion Acute respiratory failure with hypoxia History of large-volume PE 05/01/2024 s/p pulmonary thrombectomy Bilateral DVT 05/01/2024 BNP low on admission --Continue Eliquis   --Continue supplemental oxygen to maintain saturation above 92%. --Wean her off Oxygen and if not able, get home O2 eval. --Echocardiogram pending to evaluate for right heart strain.  Generalized  due to above --PT/OT evluations --SNF recommended --Fall precautions   Depression --Continue Lexapro   Hypertension BP stable, she is not on antihypertensives.   GERD (gastroesophageal reflux disease) Continue Protonix       Subjective: pt seen awake resting in bed this AM. She reports ongoing weakness and dypsnea on exertion.  States she didn't do well at all at home and felt unsafe.  Still very short of breath just ambulating to bedside commode, needed to go back to bed after up to use bathroom this AM.  Physical Exam: Vitals:   05/08/24 1511 05/08/24 2023 05/09/24 0409 05/09/24 1059  BP: 135/60 129/72 (!) 114/55   Pulse: 69 77 73   Resp:  20 14   Temp: 98.3 F (36.8 C) 98.2 F (36.8 C) 98.3 F (36.8 C)   TempSrc: Oral Oral    SpO2: 95% 94% 96% 96%  Weight:      Height:       General exam: awake, alert, no acute distress, appears  fatigued HEENT: atraumatic, clear conjunctiva, anicteric sclera, moist mucus membranes, hearing grossly normal  Respiratory system: CTAB, no wheezes, rales or rhonchi, normal respiratory effort. Cardiovascular system: normal S1/S2,  RRR, no pedal edema.   Gastrointestinal system: soft, NT, ND, no HSM felt, +bowel sounds. Central nervous system: A&O x 3. no gross focal neurologic deficits, normal speech Extremities: moves all, no edema, normal tone Skin: dry, intact, normal temperature Psychiatry: normal mood, congruent affect, judgement and insight appear normal   Data Reviewed:  Notable labs --   No new labs today  Family Communication: none present, will attempt to call daughter, Jenette Mitchell, this afternoon as time allows.   Disposition: Status is: Observation Pt unsafe to d/c home.  Needs SNF placement which is pending.    Planned Discharge Destination: Skilled nursing facility    Time spent: 42 minutes  Author: Montey Apa, DO 05/09/2024 1:35 PM  For on call review [email or web address].

## 2024-05-09 NOTE — Progress Notes (Signed)
 Occupational Therapy Treatment Patient Details Name: Rachel Harris MRN: [id number] DOB: [birth year] Today's Date: 05/09/2024   History of present illness 89 y.o. female presenting with shortness of breath and PE. PMH of GERD, hypertension, shingles, depression, chronic estradiol use, recent PE and BLE DVT s/p pulmonary thrombectomy 5/9.   OT comments  Pt is supine in bed on arrival. Easily arousable and agreeable to OT session. She denies pain. Pt performed bed mobility with MOD I, STS and ambulation ~45 feet using RW with CGA. Lowest sp02 of 93% on 2L with activity and highest HR of 121. Standing oral care performed at sink with CGA. Edu on safety, e.g. unilateral support on counter or RW or wider BOS to prevent LOB.  Pt continues to fatigue easily with limited activity tolerace.  Pt left seated in recliner with all needs in place and will cont to require skilled acute OT services to maximize her safety and IND to return to PLOF.       If plan is discharge home, recommend the following:  A little help with walking and/or transfers;Assist for transportation;Help with stairs or ramp for entrance;A lot of help with bathing/dressing/bathroom;Assistance with cooking/housework   Equipment Recommendations  Other (comment);BSC/3in1;Tub/shower seat (defer)    Recommendations for Other Services      Precautions / Restrictions Precautions Precautions: Fall Restrictions Weight Bearing Restrictions Per Provider Order: No       Mobility Bed Mobility Overal bed mobility: Modified Independent             General bed mobility comments: increased time with use of bed rails, no physical assist    Transfers Overall transfer level: Needs assistance Equipment used: Rolling walker (2 wheels) Transfers: Sit to/from Stand Sit to Stand: Contact guard assist           General transfer comment: CGA with increased ease from EOB this date; CGA using RW ~40 feet this session no LOB, slow pace      Balance Overall balance assessment: Needs assistance Sitting-balance support: Feet supported Sitting balance-Leahy Scale: Good     Standing balance support: Bilateral upper extremity supported, During functional activity, Reliant on assistive device for balance Standing balance-Leahy Scale: Fair Standing balance comment: RW use and CGA                           ADL either performed or assessed with clinical judgement   ADL Overall ADL's : Needs assistance/impaired     Grooming: Wash/dry hands;Standing;Contact guard assist;Oral care                               Functional mobility during ADLs: Contact guard assist;Rolling walker (2 wheels)      Extremity/Trunk Assessment              Vision       Perception     Praxis     Communication Communication Communication: No apparent difficulties   Cognition Arousal: Alert Behavior During Therapy: WFL for tasks assessed/performed                                 Following commands: Intact        Cueing   Cueing Techniques: Verbal cues  Exercises      Shoulder Instructions       General Comments 93% lowest on  2L, 121 highest HR    Pertinent Vitals/ Pain       Pain Assessment Pain Assessment: No/denies pain Pain Intervention(s): Monitored during session  Home Living                                          Prior Functioning/Environment              Frequency  Min 2X/week        Progress Toward Goals  OT Goals(current goals can now be found in the care plan section)  Progress towards OT goals: Progressing toward goals  Acute Rehab OT Goals Patient Stated Goal: improve strength/breathing OT Goal Formulation: With patient Time For Goal Achievement: 05/22/24 Potential to Achieve Goals: Fair  Plan      Co-evaluation                 AM-PAC OT "6 Clicks" Daily Activity     Outcome Measure   Help from another person eating  meals?: None Help from another person taking care of personal grooming?: None Help from another person toileting, which includes using toliet, bedpan, or urinal?: A Little Help from another person bathing (including washing, rinsing, drying)?: A Lot Help from another person to put on and taking off regular upper body clothing?: A Little Help from another person to put on and taking off regular lower body clothing?: A Lot 6 Click Score: 18    End of Session Equipment Utilized During Treatment: Rolling walker (2 wheels);Oxygen  OT Visit Diagnosis: Other abnormalities of gait and mobility (R26.89);Muscle weakness (generalized) (M62.81)   Activity Tolerance Patient tolerated treatment well   Patient Left in chair;with call bell/phone within reach;with chair alarm set;with family/visitor present   Nurse Communication Mobility status        Time: 1410-1439 OT Time Calculation (min): 29 min  Charges: OT General Charges $OT Visit: 1 Visit OT Treatments $Self Care/Home Management : 8-22 mins $Therapeutic Activity: 8-22 mins  Lorelai Huyser, OTR/L  05/09/24, 3:31 PM   Eduardo Honor E Lesette Frary 05/09/2024, 3:29 PM

## 2024-05-09 NOTE — Plan of Care (Signed)

## 2024-05-09 NOTE — Progress Notes (Signed)
 Mobility Specialist - Progress Note  During mobility: HR 101, SpO2 93% Post-mobility: HR 111, SPO2 92%   05/09/24 1055  Mobility  Activity Ambulated with assistance in hallway;Transferred to/from [facility];Transferred from chair to bed  Level of Assistance Standby assist, set-up cues, supervision of patient - no hands on  Assistive Device Front wheel walker;BSC  Distance Ambulated (ft) 8 ft  Activity Response Tolerated well  Mobility visit 1 Mobility  Mobility Specialist Start Time (ACUTE ONLY) 1019  Mobility Specialist Stop Time (ACUTE ONLY) 1038  Mobility Specialist Time Calculation (min) (ACUTE ONLY) 19 min   Pt sitting in the recliner upon entry, requesting to use the bathroom--- BSC d/t urgency. Pt STS to RW MinG and amb to the [facility] with supervision. Pt required MaxA for peri care with MinA to doff/don brief. Pt transferred to bed, left supine with alarm set and needs within reach.  Versa Gore Mobility Specialist 05/09/24 10:59 AM

## 2024-05-09 NOTE — Discharge Instructions (Signed)
 Information on my medicine - ELIQUIS  (apixaban )  This medication education was reviewed with me or my healthcare representative as part of my discharge preparation.  Why was Eliquis  prescribed for you? Eliquis  was prescribed to treat blood clots that may have been found in the veins of your legs (deep vein thrombosis) or in your lungs (pulmonary embolism) and to reduce the risk of them occurring again.  What do You need to know about Eliquis  ? The starting dose is 10 mg (two 5 mg tablets) taken TWICE daily for the FIRST SEVEN (7) DAYS, then on 05/13/24  the dose is reduced to ONE 5 mg tablet taken TWICE daily.  Eliquis  may be taken with or without food.   Try to take the dose about the same time in the morning and in the evening. If you have difficulty swallowing the tablet whole please discuss with your pharmacist how to take the medication safely.  Take Eliquis  exactly as prescribed and DO NOT stop taking Eliquis  without talking to the doctor who prescribed the medication.  Stopping may increase your risk of developing a new blood clot.  Refill your prescription before you run out.  After discharge, you should have regular check-up appointments with your healthcare provider that is prescribing your Eliquis .    What do you do if you miss a dose? If a dose of ELIQUIS  is not taken at the scheduled time, take it as soon as possible on the same day and twice-daily administration should be resumed. The dose should not be doubled to make up for a missed dose.  Important Safety Information A possible side effect of Eliquis  is bleeding. You should call your healthcare provider right away if you experience any of the following: Bleeding from an injury or your nose that does not stop. Unusual colored urine (red or dark brown) or unusual colored stools (red or black). Unusual bruising for unknown reasons. A serious fall or if you hit your head (even if there is no bleeding).  Some medicines  may interact with Eliquis  and might increase your risk of bleeding or clotting while on Eliquis . To help avoid this, consult your healthcare provider or pharmacist prior to using any new prescription or non-prescription medications, including herbals, vitamins, non-steroidal anti-inflammatory drugs (NSAIDs) and supplements.  This website has more information on Eliquis  (apixaban ): [email or web address] .[email or web address] Romaine Closs

## 2024-05-09 NOTE — TOC Progression Note (Signed)
 Transition of Care [facility]) - Progression Note    Patient Details  Name: Rachel Harris MRN: [id number] Date of Birth: 09-05-1935  [facility]) CM/SW Contact  Loman Risk, RN Phone Number: 05/09/2024, 3:01 PM  Clinical Narrative:     Met with patient and daughter Shelvy Dickens at bedside Presented bed offers Patient accepts offer at [facility].  Accepted in HUB and notified admission coordinator.  Requested Nitchia with TOC to start auth  Expected Discharge Plan: Skilled Nursing Facility    Expected Discharge Plan and Services       Living arrangements for the past 2 months: Single Family Home                                       Social Determinants of Health (SDOH) Interventions SDOH Screenings   Food Insecurity: No Food Insecurity (05/07/2024)  Housing: Low Risk  (05/07/2024)  Transportation Needs: No Transportation Needs (05/07/2024)  Utilities: Not At Risk (05/07/2024)  Financial Resource Strain: Low Risk  (04/24/2024)   Received from [facility]  Social Connections: Moderately Integrated (05/07/2024)  Tobacco Use: Low Risk  (05/07/2024)    Readmission Risk Interventions     No data to display

## 2024-05-09 NOTE — Progress Notes (Addendum)
 Complaining of chest pain; it is no longer going on. She said it was sharp with pressure. 12-lead EKG being completed. Vitals stable. No improvement of headache after tylenol  and tramadol  administration. DO Darus Engels notified.   05/09/24 1826  Vitals  Temp 98.1 F (36.7 C)  Temp Source Oral  BP 136/77  MAP (mmHg) 93  BP Location Left Arm  BP Method Automatic  Patient Position (if appropriate) Lying  Pulse Rate 77  Pulse Rate Source Monitor  Resp 20  MEWS COLOR  MEWS Score Color Green  Oxygen Therapy  SpO2 97 %  O2 Device Nasal Cannula  O2 Flow Rate (L/min) 2 L/min  MEWS Score  MEWS Temp 0  MEWS Systolic 0  MEWS Pulse 0  MEWS RR 0  MEWS LOC 0  MEWS Score 0

## 2024-05-09 NOTE — Progress Notes (Signed)
 Physical Therapy Treatment Patient Details Name: Rachel Harris MRN: [id number] DOB: [birth year] Today's Date: 05/09/2024   History of Present Illness 89 y.o. female presenting with shortness of breath and PE. PMH of GERD, hypertension, shingles, depression, chronic estradiol use, recent PE and BLE DVT s/p pulmonary thrombectomy 5/9.    PT Comments  Patient received in bed, she is agreeable to PT. Family member at bedside. She is mod I with bed mobility. Transfers with cga and ambulated with RW 100 feet cga. Patient moves at slow, cautious pace. Fatigued with mobility and will continue to benefit from PT to improve endurance and strength.      If plan is discharge home, recommend the following: A little help with walking and/or transfers;A little help with bathing/dressing/bathroom;Assist for transportation;Help with stairs or ramp for entrance   Can travel by private vehicle     Yes  Equipment Recommendations  None recommended by PT    Recommendations for Other Services       Precautions / Restrictions Precautions Precautions: Fall Restrictions Weight Bearing Restrictions Per Provider Order: No     Mobility  Bed Mobility Overal bed mobility: Modified Independent Bed Mobility: Supine to Sit     Supine to sit: Modified independent (Device/Increase time)     General bed mobility comments: increased time, use of bed rails. No physical assist provided    Transfers Overall transfer level: Needs assistance Equipment used: Rolling walker (2 wheels) Transfers: Sit to/from Stand Sit to Stand: Contact guard assist                Ambulation/Gait Ambulation/Gait assistance: Contact guard assist Gait Distance (Feet): 100 Feet Assistive device: Rolling walker (2 wheels)   Gait velocity: decreased     General Gait Details: Pt with very slow pace, fatigue. No lob.   Stairs             Wheelchair Mobility     Tilt Bed    Modified Rankin (Stroke Patients  Only)       Balance Overall balance assessment: Needs assistance Sitting-balance support: Feet supported Sitting balance-Leahy Scale: Good     Standing balance support: Bilateral upper extremity supported, During functional activity, Reliant on assistive device for balance Standing balance-Leahy Scale: Fair Standing balance comment: CGA with RW, standing rest break                            Communication Communication Communication: No apparent difficulties  Cognition Arousal: Alert Behavior During Therapy: WFL for tasks assessed/performed   PT - Cognitive impairments: No apparent impairments                         Following commands: Intact      Cueing Cueing Techniques: Verbal cues  Exercises      General Comments        Pertinent Vitals/Pain Pain Assessment Pain Assessment: No/denies pain Pain Intervention(s): Monitored during session    Home Living                          Prior Function            PT Goals (current goals can now be found in the care plan section) Acute Rehab PT Goals Patient Stated Goal: to go to rehab, then home PT Goal Formulation: With patient/family Time For Goal Achievement: 05/21/24 Potential to Achieve Goals: Good Progress towards  PT goals: Progressing toward goals    Frequency    Min 2X/week      PT Plan      Co-evaluation              AM-PAC PT "6 Clicks" Mobility   Outcome Measure  Help needed turning from your back to your side while in a flat bed without using bedrails?: A Little Help needed moving from lying on your back to sitting on the side of a flat bed without using bedrails?: A Little Help needed moving to and from a bed to a chair (including a wheelchair)?: A Little Help needed standing up from a chair using your arms (e.g., wheelchair or bedside chair)?: A Little Help needed to walk in hospital room?: A Little Help needed climbing 3-5 steps with a railing? : A  Lot 6 Click Score: 17    End of Session Equipment Utilized During Treatment: Gait belt;Oxygen Activity Tolerance: Patient limited by fatigue Patient left: in chair;with call bell/phone within reach;with chair alarm set;with family/visitor present Nurse Communication: Mobility status PT Visit Diagnosis: Muscle weakness (generalized) (M62.81);Difficulty in walking, not elsewhere classified (R26.2)     Time: 1610-9604 PT Time Calculation (min) (ACUTE ONLY): 26 min  Charges:    $Gait Training: 23-37 mins PT General Charges $$ ACUTE PT VISIT: 1 Visit                     Deleah Tison, PT, GCS 05/09/24,11:09 AM

## 2024-05-09 NOTE — Plan of Care (Signed)
   Problem: Education: Goal: Knowledge of General Education information will improve Description Including pain rating scale, medication(s)/side effects and non-pharmacologic comfort measures Outcome: Progressing

## 2024-05-09 NOTE — Progress Notes (Addendum)
 Patient stating she does not feel right; she said she still has headache and feels weaker than she has. DO Darus Engels notified.  Patient refused tylenol  this morning stating it didn't help with headache. Will attempt again.

## 2024-05-10 ENCOUNTER — Observation Stay

## 2024-05-10 DIAGNOSIS — R0609 Other forms of dyspnea: Secondary | ICD-10-CM | POA: Diagnosis not present

## 2024-05-10 LAB — BASIC METABOLIC PANEL WITH GFR
Anion gap: 7 (ref 5–15)
BUN: 10 mg/dL (ref 8–23)
CO2: 27 mmol/L (ref 22–32)
Calcium: 8.1 mg/dL — ABNORMAL LOW (ref 8.9–10.3)
Chloride: 99 mmol/L (ref 98–111)
Creatinine, Ser: 0.7 mg/dL (ref 0.44–1.00)
GFR, Estimated: >60 mL/min (ref >60)
Glucose, Bld: 98 mg/dL (ref 70–99)
Potassium: 3.4 mmol/L — ABNORMAL LOW (ref 3.5–5.1)
Sodium: 133 mmol/L — ABNORMAL LOW (ref 135–145)

## 2024-05-10 MED ORDER — BUTALBITAL-APAP-CAFFEINE 50-325-40 MG PO TABS
1.0000 | ORAL_TABLET | Freq: Four times a day (QID) | ORAL | Status: DC | PRN
  Administered 2024-05-13: 1 via ORAL
  Filled 2024-05-10 (×2): qty 1

## 2024-05-10 MED ORDER — ENSURE ENLIVE PO LIQD
237.0000 mL | Freq: Three times a day (TID) | ORAL | Status: DC
Start: 2024-05-10 — End: 2024-05-13
  Administered 2024-05-11 – 2024-05-12 (×2): 237 mL via ORAL

## 2024-05-10 MED ORDER — DICYCLOMINE HCL 10 MG PO CAPS: 10.0000 mg | ORAL_CAPSULE | Freq: Three times a day (TID) | ORAL | Status: DC | PRN

## 2024-05-10 NOTE — Progress Notes (Signed)
 Mobility Specialist - Progress Note   05/10/24 1508  Mobility  Activity Transferred to/from [facility];Ambulated with assistance in room  Level of Assistance Standby assist, set-up cues, supervision of patient - no hands on  Assistive Device Front wheel walker;BSC  Distance Ambulated (ft) 8 ft  Activity Response Tolerated well  Mobility visit 1 Mobility  Mobility Specialist Start Time (ACUTE ONLY) 1042  Mobility Specialist Stop Time (ACUTE ONLY) 1054  Mobility Specialist Time Calculation (min) (ACUTE ONLY) 12 min   MS responding to call bell. Pt supine upon entry, utilizing RA. Pt requesting to transfer to the [facility]. Pt completed bed mob ModI, STS to RW and transferred to/from the [facility] with supervision. Pt returned to bed, left supine with alarm set and needs within reach.  Versa Gore Mobility Specialist 05/10/24 3:11 PM

## 2024-05-10 NOTE — TOC Progression Note (Addendum)
 Transition of Care [facility]) - Progression Note    Patient Details  Name: Rachel Harris MRN: [id number] Date of Birth: [birth year]  [facility]) CM/SW Contact  Loman Risk, RN Phone Number: 05/10/2024, 10:40 AM  Clinical Narrative:      Insurance auth pending for [facility]  226 pm - auth still pending.  Per Antony Baumgartner at [facility] they are not accepting weekend admissions  Expected Discharge Plan: Skilled Nursing Facility    Expected Discharge Plan and Services       Living arrangements for the past 2 months: Single Family Home                                       Social Determinants of Health (SDOH) Interventions SDOH Screenings   Food Insecurity: No Food Insecurity (05/07/2024)  Housing: Low Risk  (05/07/2024)  Transportation Needs: No Transportation Needs (05/07/2024)  Utilities: Not At Risk (05/07/2024)  Financial Resource Strain: Low Risk  (04/24/2024)   Received from [facility]  Social Connections: Moderately Integrated (05/07/2024)  Tobacco Use: Low Risk  (05/07/2024)    Readmission Risk Interventions     No data to display

## 2024-05-10 NOTE — Plan of Care (Signed)

## 2024-05-10 NOTE — Progress Notes (Addendum)
 Progress Note   Patient: Rachel Harris GNF:[id number] DOB: 1935-02-26 DOA: 05/07/2024     0 DOS: the patient was seen and examined on 05/10/2024   Brief hospital course: "MAYGEN SIRICO is a 89 y.o. 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 "   Further hospital course and management as outlined below.   Assessment and Plan:  * Dyspnea on exertion Acute respiratory failure with hypoxia History of large-volume PE 05/01/2024 s/p pulmonary thrombectomy Bilateral DVT 05/01/2024 BNP low on admission --Continue Eliquis   --Continue supplemental oxygen to maintain saturation above 92%. --Wean her off Oxygen and if not able, get home O2 eval. --Echocardiogram pending to evaluate for right heart strain.  Headache -- since admission, no focal neurologic changes --Tylenol  and/or Tramadol  PRN --Trial Fioricet if not improved with above --CT head w/o contrast given age and no significant HA history  Reduced PO intake -- due to ?esophageal issue.  Pt describes foods feel like not going down, has led to poor to minimal PO intake for the past several months.  This contributes to her increased generalized weakness --Will review prior GI records and ?EGD's --Esophagram to evaluate  Generalized  due to above --PT/OT evluations --SNF recommended --Fall precautions   Depression --Continue Lexapro    Hypertension BP stable, she is not on antihypertensives.   GERD (gastroesophageal reflux disease) Continue Protonix       Subjective: pt seen awake resting in bed this AM, daughter at bedside. Pt reports headache but not as severe as yesterday. She denies history of being prone to  headaches.  She also has upset stomach and hasn't been able to eat much at all.  Reports this ongoing for some time at home, leading to some of the weakness.  She describes not being able to eat much, feels like foods not going down so she doesn't feel like eating more than a fraction of her meals.  Less PO intake for some time at home due to this problem.  Reports ongoing dyspnea on minimal exertion as well.   Physical Exam: Vitals:   05/09/24 1949 05/09/24 2228 05/10/24 0315 05/10/24 0846  BP: (!) 110/59  122/67 123/66  Pulse: 84  79 73  Resp: 20 18 20 20   Temp: 97.9 F (36.6 C)  98 F (36.7 C) 97.7 F (36.5 C)  TempSrc: Oral  Oral Oral  SpO2: 96%  94% 96%  Weight:      Height:       General exam: awake, alert, no acute distress, mildly ill-appearing and fatigued HEENT: moist mucus membranes, hearing grossly normal  Respiratory system: CTAB, no wheezes, rales or rhonchi, normal respiratory effort. Cardiovascular system: normal S1/S2,  RRR, no pedal edema.   Gastrointestinal system: soft, NT, ND, no HSM felt, +bowel sounds. Central nervous system: A&O x 3. no gross focal neurologic deficits, normal speech Extremities: moves all, no edema, normal tone Skin: dry, intact, normal temperature Psychiatry: normal mood, congruent affect, judgement and insight appear normal   Data Reviewed:  Notable labs --  K 3.4 Na 133 Ca 8.1  Family Communication: daughter at bedside on rounds this AM  Disposition: Status is: Observation Pt unsafe to d/c home.  Needs SNF placement which is pending. Medically --  Ongoing nausea and upset stomach today, persistent headache with CT scan to evaluate.    Planned Discharge Destination: Skilled nursing facility    Time spent: 42 minutes  Author: Montey Apa, DO 05/10/2024 2:41 PM  For on call review [email or web address].

## 2024-05-11 DIAGNOSIS — R0609 Other forms of dyspnea: Secondary | ICD-10-CM | POA: Diagnosis not present

## 2024-05-11 DIAGNOSIS — K224 Dyskinesia of esophagus: Secondary | ICD-10-CM | POA: Diagnosis present

## 2024-05-11 LAB — URINALYSIS, COMPLETE (UACMP) WITH MICROSCOPIC
Bilirubin Urine: NEGATIVE
Glucose, UA: NEGATIVE mg/dL
Hgb urine dipstick: NEGATIVE
Ketones, ur: NEGATIVE mg/dL
Nitrite: NEGATIVE
Protein, ur: NEGATIVE mg/dL
Specific Gravity, Urine: 1.006 (ref 1.005–1.030)
pH: 6 (ref 5.0–8.0)

## 2024-05-11 MED ORDER — POTASSIUM CHLORIDE CRYS ER 20 MEQ PO TBCR
40.0000 meq | EXTENDED_RELEASE_TABLET | Freq: Once | ORAL | Status: AC
  Administered 2024-05-11: 40 meq via ORAL
  Filled 2024-05-11: qty 2

## 2024-05-11 MED ORDER — MELATONIN 5 MG PO TABS
5.0000 mg | ORAL_TABLET | Freq: Every evening | ORAL | Status: DC | PRN
  Administered 2024-05-11: 5 mg via ORAL
  Filled 2024-05-11: qty 1

## 2024-05-11 MED ORDER — CALCIUM CARBONATE ANTACID 500 MG PO CHEW
2.0000 | CHEWABLE_TABLET | Freq: Two times a day (BID) | ORAL | Status: DC | PRN
  Administered 2024-05-11: 400 mg via ORAL
  Filled 2024-05-11: qty 2

## 2024-05-11 NOTE — Plan of Care (Signed)

## 2024-05-11 NOTE — Progress Notes (Signed)
 Progress Note   Patient: Rachel Harris ZOX:[id number] DOB: Jun 08, 1935 DOA: 05/07/2024     0 DOS: the patient was seen and examined on 05/11/2024   Brief hospital course: "Rachel Harris is a 89 y.o. 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 "   Further hospital course and management as outlined below.   Assessment and Plan:  * Dyspnea on exertion Acute respiratory failure with hypoxia History of large-volume PE 05/01/2024 s/p pulmonary thrombectomy Bilateral DVT 05/01/2024 BNP low on admission --Continue Eliquis   --Continue supplemental oxygen to maintain saturation above 92%. --Wean her off Oxygen and if not able, get home O2 eval. --Echocardiogram pending to evaluate for right heart strain.  Headache -- since admission, no focal neurologic changes --Tylenol  and/or Tramadol  PRN --Trial Fioricet if not improved with above --CT head w/o contrast given age and no significant HA history  Reduced PO intake -- due to ?esophageal issue.  Pt describes foods feel like not going down, has led to poor to minimal PO intake Esophageal dysmotility - noted on esophagram 5/16 --Ensures TID between meals  Intermittent Nausea without Vomiting ?Constipation was contributing, pt feels better today after BM's Esophageal issues likely also contribute --PRN antiemetics --Bowel regimen  Generalized  due to above --PT/OT evluations --SNF recommended --Fall precautions   Depression --Continue Lexapro    Hypertension BP stable, she is not on antihypertensives.   GERD (gastroesophageal reflux disease) Continue Protonix       Subjective: pt seen awake resting in bed this AM,  besides still feeling weak and tired, feels little bit better today.  No acute complaints.  Eager to go to rehab, citing too weak to function at home alone and daughter only can check on her twice a day.   Physical Exam: Vitals:   05/10/24 0846 05/10/24 2027 05/11/24 0327 05/11/24 0751  BP: 123/66 128/76 123/65 126/70  Pulse: 73 76 77 69  Resp: 20 16 18 16   Temp: 97.7 F (36.5 C) 97.6 F (36.4 C) 97.7 F (36.5 C) 98.2 F (36.8 C)  TempSrc: Oral   Oral  SpO2: 96% 93% 94% 95%  Weight:      Height:       General exam: awake, alert, no acute distress HEENT: moist mucus membranes, hearing grossly normal  Respiratory system: CTAB, no wheezes, rales or rhonchi, normal respiratory effort. Cardiovascular system: normal S1/S2,  RRR, no pedal edema.   Gastrointestinal system: soft, NT, ND Central nervous system: A&O x 3. no gross focal neurologic deficits, normal speech Extremities: moves all, no edema, normal tone Skin: dry, intact, normal temperature Psychiatry: normal mood, congruent affect, judgement and insight appear normal   Data Reviewed:  UA today - contaminated with epi cells, trace leukocytes, rare bacteria  No other new labs today  Notable labs 5/16 --  K 3.4 Na 133 Ca 8.1  Family Communication: daughter at bedside on rounds 5/16 and updated on test results by phone 5/16 evening.   Disposition: Status is: Observation Pt unsafe to d/c home.  Needs SNF placement which is pending.     Planned Discharge Destination: Skilled nursing facility    Time spent: 42 minutes  Author: Montey Apa, DO 05/11/2024 12:25 PM  For on call review  [email or web address]

## 2024-05-12 DIAGNOSIS — R0609 Other forms of dyspnea: Secondary | ICD-10-CM | POA: Diagnosis not present

## 2024-05-12 LAB — BASIC METABOLIC PANEL WITH GFR
Anion gap: 5 (ref 5–15)
BUN: 8 mg/dL (ref 8–23)
CO2: 27 mmol/L (ref 22–32)
Calcium: 8.3 mg/dL — ABNORMAL LOW (ref 8.9–10.3)
Chloride: 100 mmol/L (ref 98–111)
Creatinine, Ser: 0.69 mg/dL (ref 0.44–1.00)
GFR, Estimated: >60 mL/min (ref >60)
Glucose, Bld: 87 mg/dL (ref 70–99)
Potassium: 3.9 mmol/L (ref 3.5–5.1)
Sodium: 132 mmol/L — ABNORMAL LOW (ref 135–145)

## 2024-05-12 MED ORDER — POLYETHYLENE GLYCOL 3350 17 G PO PACK
17.0000 g | PACK | Freq: Every day | ORAL | Status: DC
  Administered 2024-05-12 – 2024-05-13 (×2): 17 g via ORAL
  Filled 2024-05-12 (×2): qty 1

## 2024-05-12 NOTE — TOC Progression Note (Signed)
 Transition of Care [facility]) - Progression Note    Patient Details  Name: Rachel Harris MRN: [id number] Date of Birth: [birth year]  [facility] CM/SW Contact  Joslyn Nim, RN Phone Number: 05/12/2024, 8:25 AM  Clinical Narrative:    CM checked [employer] yesterday morning.  There was a message .Aaron Aas offering the treating practitioner an option to speak with a Medical Director before final determination. Provider to call: [phone] Option 5. Deadline is: 05/13/24 12:00 PM EDT If no response, MD will render determination.  CM sent message to Dr. Antoniette Batty yesterday.   CM check website this morning, Siegfried Dress is still pending. CM reached out to Dr. Antoniette Batty. She states plan to call today. CM will continue to follow.    Expected Discharge Plan: Skilled Nursing Facility    Expected Discharge Plan and Services       Living arrangements for the past 2 months: Single Family Home                                       Social Determinants of Health (SDOH) Interventions SDOH Screenings   Food Insecurity: No Food Insecurity (05/07/2024)  Housing: Low Risk  (05/07/2024)  Transportation Needs: No Transportation Needs (05/07/2024)  Utilities: Not At Risk (05/07/2024)  Financial Resource Strain: Low Risk  (04/24/2024)   Received from [facility]  Social Connections: Moderately Integrated (05/07/2024)  Tobacco Use: Low Risk  (05/07/2024)    Readmission Risk Interventions     No data to display

## 2024-05-12 NOTE — Plan of Care (Signed)
   Problem: Education: Goal: Knowledge of General Education information will improve Description: Including pain rating scale, medication(s)/side effects and non-pharmacologic comfort measures Outcome: Progressing   Problem: Activity: Goal: Risk for activity intolerance will decrease Outcome: Progressing   Problem: Coping: Goal: Level of anxiety will decrease Outcome: Progressing

## 2024-05-12 NOTE — Progress Notes (Addendum)
 Progress Note   Patient: Rachel Harris:[id number] DOB: June 23, 1935 DOA: 05/07/2024     0 DOS: the patient was seen and examined on 05/12/2024   Brief hospital course: "Rachel Harris is a 89 y.o. 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 "   Further hospital course and management as outlined below.   Assessment and Plan:  * Dyspnea on exertion Acute respiratory failure with hypoxia History of large-volume PE 05/01/2024 s/p pulmonary thrombectomy Bilateral DVT 05/01/2024 Echocardiogram showed no right heart strain. --Continue Eliquis   --Continue supplemental oxygen to maintain saturation above 92%. --Wean her off Oxygen and if not able, get home O2 eval.  Headache -- since admission, no focal neurologic changes CT head was negative. --Tylenol  and/or Tramadol  PRN --Trial Fioricet if not improved with above  Reduced PO intake -- due to ?esophageal issue.  Pt describes foods feel like not going down, has led to poor to minimal PO intake for the past several months.  This contributes to her increased generalized weakness Esophageal dysmotility - noted on esophagram 5/16 --Ensures TID between meals --Dietitian consulted  Intermittent Nausea without Vomiting ?Constipation was contributing, pt feels better today after BM's Esophageal issues likely also contribute --PRN antiemetics --Bowel regimen  Generalized Weakness  due to above, inadequate PO intake, large PE and bilateral DVT's and admission for that, discharged home and was profoundly weak, more so than expected.  Daughter only able to come check in AM and PM. --PT/OT evluations --SNF recommended --Fall  precautions   Depression --Continue Lexapro    Hypertension BP stable, she is not on antihypertensives.   GERD (gastroesophageal reflux disease) Continue Protonix       Subjective: pt seen awake resting in bed this AM. Reports feeling okay.  Stomach issues better, did not have a BM yesterday, drinking Miralax  this AM.  Denies other acute complaints.  Continues to feel she needs rehab due to her weakness and living alone.   Physical Exam: Vitals:   05/11/24 2010 05/11/24 2030 05/12/24 0304 05/12/24 0853  BP: 109/60  117/68 128/68  Pulse: 79  72 67  Resp: 17  18 18   Temp: 98.3 F (36.8 C)  98.1 F (36.7 C) 98.3 F (36.8 C)  TempSrc:    Oral  SpO2: (!) 88% 91% 91% 90%  Weight:      Height:       General exam: awake, alert, no acute distress HEENT: moist mucus membranes, hearing grossly normal  Respiratory system: on room air, normal respiratory effort. Cardiovascular system: RRR, no pedal edema.   Gastrointestinal system: soft, NT, ND Central nervous system: A&O x 3. no gross focal neurologic deficits, normal speech Extremities: moves all, no edema, normal tone Skin: dry, intact, normal temperature Psychiatry: normal mood, congruent affect, judgement and insight appear normal   Data Reviewed:  UA today - contaminated with epi cells, trace leukocytes, rare bacteria  No other new labs today  Notable labs 5/16 --  K 3.4 Na 133 Ca 8.1  Family Communication: daughter at bedside on rounds 5/16 and updated on test results by phone 5/16 evening.   Disposition: Status is: Observation Pt unsafe to d/c home.  Needs SNF placement which is pending.     Planned Discharge  Destination: Skilled nursing facility    Time spent: 38 minutes  Author: Montey Apa, DO 05/12/2024 12:57 PM  For on call review [email or web address]

## 2024-05-13 DIAGNOSIS — R0609 Other forms of dyspnea: Secondary | ICD-10-CM | POA: Diagnosis not present

## 2024-05-13 MED ORDER — DICYCLOMINE HCL 10 MG PO CAPS: 10.0000 mg | ORAL_CAPSULE | Freq: Three times a day (TID) | ORAL | 0 refills | Status: AC | PRN

## 2024-05-13 MED ORDER — BOOST / RESOURCE BREEZE PO LIQD CUSTOM: 1.0000 | Freq: Three times a day (TID) | ORAL | Status: DC

## 2024-05-13 MED ORDER — BISACODYL 10 MG RE SUPP
10.0000 mg | Freq: Once | RECTAL | Status: AC
Start: 2024-05-13 — End: 2024-05-13
  Administered 2024-05-13: 10 mg via RECTAL
  Filled 2024-05-13: qty 1

## 2024-05-13 MED ORDER — ADULT MULTIVITAMIN W/MINERALS CH: 1.0000 | ORAL_TABLET | Freq: Every day | ORAL | Status: DC

## 2024-05-13 MED ORDER — ENSURE ENLIVE PO LIQD
237.0000 mL | Freq: Three times a day (TID) | ORAL | Status: AC
Start: 2024-05-13 — End: ?

## 2024-05-13 MED ORDER — CALCIUM CARBONATE ANTACID 500 MG PO CHEW: 2.0000 | CHEWABLE_TABLET | Freq: Two times a day (BID) | ORAL | Status: AC | PRN

## 2024-05-13 MED ORDER — POLYETHYLENE GLYCOL 3350 17 G PO PACK: 17.0000 g | PACK | Freq: Every day | ORAL | Status: AC

## 2024-05-13 NOTE — Discharge Summary (Signed)
 Physician Discharge Summary   Patient: Rachel Harris MRN: [id number] DOB: 1935-09-19  Admit date:     05/07/2024  Discharge date: 05/14/24  Discharge Physician: Montey Apa   PCP: Yehuda Helms, MD   Recommendations at discharge:    Follow up with Primary Care in 1-2 weeks Repeat CBC, CMP at follow up   Discharge Diagnoses: Principal Problem:   Dyspnea on exertion Active Problems:   GERD (gastroesophageal reflux disease)   Hypertension   Depression   Esophageal dysmotility  Resolved Problems:   * No resolved hospital problems. *  Hospital Course:  "JOBY Harris is a 89 y.o. 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 "     Further hospital course and management as outlined below.  5/20 -- pt doing well this AM, no acute complaints.  Patient's mobility has improved since admission, PT now recommended home health.  Pt and family are agreeable.  Assessment and Plan:  Dyspnea on exertion - improved Acute respiratory failure with hypoxia - RESOLVED  History of large-volume PE 05/01/2024 s/p pulmonary thrombectomy Bilateral DVT 05/01/2024 Echocardiogram showed no right heart strain. --Continue Eliquis   --Continue supplemental oxygen to maintain saturation above 92%. --Wean her off Oxygen and if not able, get home O2 eval.   Headache -- since admission, no focal neurologic changes CT head was negative. --Tylenol  and/or Tramadol  PRN --Trial Fioricet  if not improved with above   Reduced PO intake -- due to ?esophageal issue.  Pt describes foods feel like not going down, has led to poor to minimal PO intake for the past several months.  This  contributes to her increased generalized weakness Esophageal dysmotility - noted on esophagram 5/16 --Ensures TID between meals --Dietitian consulted   Intermittent Nausea without Vomiting ?Constipation was contributing, pt feels better today after BM's Esophageal issues likely also contribute --PRN antiemetics --Bowel regimen   Generalized Weakness  due to above, inadequate PO intake, large PE and bilateral DVT's and admission for that, discharged home and was profoundly weak, more so than expected.  Daughter only able to come check in AM and PM. --PT/OT evluations --SNF recommended --Fall precautions   Depression --Continue Lexapro    Hypertension BP stable, she is not on antihypertensives.   GERD (gastroesophageal reflux disease) Continue Protonix        Consultants: none Procedures performed: none  Disposition: Home health Diet recommendation:  Cardiac diet DISCHARGE MEDICATION: Allergies as of 05/13/2024       Reactions   Ciprofloxacin Rash   Amlodipine Swelling   Amoxicillin    Ceclor [cefaclor]    Codeine    Erythromycin    Levaquin [levofloxacin In D5w]    Macrodantin [nitrofurantoin Macrocrystal]    Zithromax [azithromycin]    Hydrochlorothiazide Rash   dehydation   Sulfa Antibiotics Rash        Medication List     STOP taking these medications    escitalopram  10 MG tablet Commonly known as: LEXAPRO        TAKE these medications    acetaminophen  650 MG CR tablet Commonly known as: TYLENOL  Take 650-1,300 mg by mouth every 8 (eight) hours as needed for pain.   apixaban  5 MG Tabs tablet Commonly known as: ELIQUIS  Take  1 tablet (5 mg total) by mouth 2 (two) times daily. Start taking on: June 02, 2024 What changed: Another medication with the same name was removed. Continue taking this medication, and follow the directions you see here.   calcium  carbonate 500 MG chewable tablet Commonly known as: TUMS - dosed in mg elemental calcium  Chew  2 tablets (400 mg of elemental calcium  total) by mouth 2 (two) times daily as needed for indigestion or heartburn.   dicyclomine  10 MG capsule Commonly known as: BENTYL  Take 1 capsule (10 mg total) by mouth 3 (three) times daily as needed (abdominal cramping pain).   feeding supplement Liqd Take 237 mLs by mouth 3 (three) times daily between meals.   ipratropium 0.03 % nasal spray Commonly known as: ATROVENT Place 2 sprays into both nostrils in the morning.   omeprazole 20 MG capsule Commonly known as: PRILOSEC Take 20 mg by mouth in the morning.   polyethylene glycol 17 g packet Commonly known as: MIRALAX  / GLYCOLAX  Take 17 g by mouth daily. Start taking on: May 14, 2024   traMADol  50 MG tablet Commonly known as: ULTRAM  Take 1 tablet (50 mg total) by mouth as needed for severe pain (pain score 7-10).   valACYclovir  1000 MG tablet Commonly known as: VALTREX  Take 1,000 mg by mouth 3 (three) times daily.               Durable Medical Equipment  (From admission, onward)           Start     Ordered   05/13/24 1517  For home use only DME Walker rolling  Once       Question Answer Comment  Walker: With 5 Inch Wheels   Patient needs a walker to treat with the following condition Generalized weakness      05/13/24 1516            Contact information for after-discharge care     Destination     [facility]erred [facility] .   Service: Skilled Nursing Contact information: [address] [place]  [phone]                    Discharge Exam: Cleavon Curls Weights   05/07/24 1841 05/07/24 2352  Weight: 84.4 kg 79.2 kg   General exam: awake, alert, no acute distress HEENT: atraumatic, clear conjunctiva, anicteric sclera, moist mucus membranes, hearing grossly normal  Respiratory system: CTAB, no wheezes, rales or rhonchi, normal respiratory  effort. Cardiovascular system: normal S1/S2,  RRR, no JVD, murmurs, rubs, gallops,  no pedal edema.   Gastrointestinal system: soft, NT, ND, no HSM felt, +bowel sounds. Central nervous system: A&O x4. no gross focal neurologic deficits, normal speech Extremities: moves all , no edema, normal tone Skin: dry, intact, normal temperature, normal color, No rashes, lesions or ulcers Psychiatry: normal mood, congruent affect, judgement and insight appear normal   Condition at discharge: stable  The results of significant diagnostics from this hospitalization (including imaging, microbiology, ancillary and laboratory) are listed below for reference.   Imaging Studies: CT HEAD WO CONTRAST ( ) Result Date: 05/10/2024 CLINICAL DATA:  Headache, new onset (Age >= 51y) EXAM: CT HEAD WITHOUT CONTRAST TECHNIQUE: Contiguous axial images were obtained from the base of the skull through the vertex without intravenous contrast. RADIATION DOSE REDUCTION: This exam was performed according to the departmental dose-optimization program which includes automated exposure control, adjustment of the  mA and/or kV according to patient size and/or use of iterative reconstruction technique. COMPARISON:  CT head March 21, 2023. FINDINGS: Brain: No evidence of acute infarction, hemorrhage, hydrocephalus, extra-axial collection or mass lesion/mass effect. Vascular: No hyperdense vessel or unexpected calcification. Skull: Normal. Negative for fracture or focal lesion. Sinuses/Orbits: No acute finding. IMPRESSION: No evidence of acute intracranial abnormality. Electronically Signed   By: Stevenson Elbe M.D.   On: 05/10/2024 17:49   DG ESOPHAGUS W SINGLE CM (SOL OR THIN BA) Result Date: 05/10/2024 CLINICAL DATA:  89 year old female with a recent history of pulmonary embolism on Eliquis  admitted for acute respiratory failure. Admits to difficulty swallowing for the past several months leading to decreased oral intake. She feels  like certain solid foods get stuck in her upper chest. EXAM: ESOPHAGUS/BARIUM SWALLOW/TABLET STUDY TECHNIQUE: Single contrast examination was performed using thin liquid barium. This exam was performed by [facility] PA-C, and was supervised and interpreted by Dr. Wilnette Haste. FLUOROSCOPY: Radiation Exposure Index (as provided by the fluoroscopic device): 23.2 mGy Kerma COMPARISON:  None Available. FINDINGS: Swallowing: Appears normal. No vestibular penetration or aspiration seen. Pharynx: Unremarkable. Esophagus: Normal appearance. Esophageal motility: Moderate esophageal dysmotility with tertiary contractions noted to the mid to distal esophagus. Intermittent proximal escape and phase stasis of barium in the distal esophagus eventually passing into the stomach. Hiatal Hernia: None visualized Gastroesophageal reflux: None visualized with coughing or Valsalva. Other: None. IMPRESSION: Moderate esophageal dysmotility Electronically Signed   By: Myrlene Asper D.O.   On: 05/10/2024 16:44   ECHOCARDIOGRAM COMPLETE Result Date: 05/09/2024    ECHOCARDIOGRAM REPORT   Patient Name:   YANNI RUBERG Date of Exam: 05/08/2024 Medical Rec #:  [id number]       Height:       66.0 in Accession #:    [id number]      Weight:       174.6 lb Date of Birth:  11-19-1935        BSA:          1.888 m Patient Age:    88 years        BP:           135/60 mmHg Patient Gender: F               HR:           76 bpm. Exam Location:  [facility] Procedure: 2D Echo, Cardiac Doppler and Color Doppler (Both Spectral and Color            Flow Doppler were utilized during procedure). Indications:     I26.09 Pulmonary embolus  History:         Patient has no prior history of Echocardiogram examinations.                  Signs/Symptoms:Dyspnea; Risk Factors:Hypertension.  Sonographer:     Brigid Canada RDCS Referring Phys:  [id number] Lanetta Pion Diagnosing Phys: Belva Boyden MD IMPRESSIONS  1. Left ventricular ejection fraction, by estimation,  is 60 to 65%. The left ventricle has normal function. The left ventricle has no regional wall motion abnormalities. Left ventricular diastolic parameters are consistent with Grade I diastolic dysfunction (impaired relaxation).  2. Right ventricular systolic function is normal. The right ventricular size is normal. Tricuspid regurgitation signal is inadequate for assessing PA pressure.  3. Left atrial size was mildly dilated.  4. The mitral valve is normal in structure. Mild mitral valve regurgitation. No evidence of mitral stenosis.  5. The aortic valve is tricuspid. There is mild calcification of the aortic valve. Aortic valve regurgitation is mild. Aortic valve sclerosis/calcification is present, without any evidence of aortic stenosis.  6. The inferior vena cava is normal in size with greater than 50% respiratory variability, suggesting right atrial pressure of 3 mmHg. FINDINGS  Left Ventricle: Left ventricular ejection fraction, by estimation, is 60 to 65%. The left ventricle has normal function. The left ventricle has no regional wall motion abnormalities. Strain was performed and the global longitudinal strain is indeterminate. The left ventricular internal cavity size was normal in size. There is no left ventricular hypertrophy. Left ventricular diastolic parameters are consistent with Grade I diastolic dysfunction (impaired relaxation). Right Ventricle: The right ventricular size is normal. No increase in right ventricular wall thickness. Right ventricular systolic function is normal. Tricuspid regurgitation signal is inadequate for assessing PA pressure. Left Atrium: Left atrial size was mildly dilated. Right Atrium: Right atrial size was normal in size. Pericardium: There is no evidence of pericardial effusion. Mitral Valve: The mitral valve is normal in structure. Mild mitral annular calcification. Mild mitral valve regurgitation. No evidence of mitral valve stenosis. Tricuspid Valve: The tricuspid valve  is normal in structure. Tricuspid valve regurgitation is not demonstrated. No evidence of tricuspid stenosis. The aortic valve is tricuspid. There is mild calcification of the aortic valve. Aortic valve regurgitation is mild. Aortic valve sclerosis/calcification is present, without any evidence of aortic stenosis. Pulmonic Valve: The pulmonic valve was normal in structure. Pulmonic valve regurgitation is not visualized. No evidence of pulmonic stenosis. Aorta: The aortic root is normal in size and structure. Venous: The inferior vena cava is normal in size with greater than 50% respiratory variability, suggesting right atrial pressure of 3 mmHg. IAS/Shunts: No atrial level shunt detected by color flow Doppler. Additional Comments: 3D was performed not requiring image post processing on an independent workstation and was indeterminate.  LEFT VENTRICLE PLAX 2D LVIDd:         3.20 cm   Diastology LVIDs:         2.30 cm   LV e' medial:    8.54 cm/s LV PW:         1.20 cm   LV E/e' medial:  11.7 LV IVS:        1.30 cm   LV e' lateral:   9.99 cm/s LVOT diam:     1.80 cm   LV E/e' lateral: 10.0 LV SV:         70 LV SV Index:   37 LVOT Area:     2.54 cm  RIGHT VENTRICLE             IVC RV Basal diam:  3.80 cm     IVC diam: 1.80 cm RV Mid diam:    3.10 cm RV S prime:     16.53 cm/s TAPSE (M-mode): 3.1 cm LEFT ATRIUM             Index        RIGHT ATRIUM           Index LA diam:        4.60 cm 2.44 cm/m   RA Area:     12.30 cm LA Vol (A2C):   68.9 ml 36.50 ml/m  RA Volume:   27.40 ml  14.52 ml/m LA Vol (A4C):   47.3 ml 25.06 ml/m LA Biplane Vol: 57.4 ml 30.41 ml/m  AORTIC VALVE AV Area (Vmax):    2.37  cm AV Area (Vmean):   2.33 cm AV Area (VTI):     2.39 cm AV Vmax:           152.67 cm/s AV Vmean:          103.433 cm/s AV VTI:            0.293 m AV Peak Grad:      9.3 mmHg AV Mean Grad:      5.3 mmHg LVOT Vmax:         142.00 cm/s LVOT Vmean:        94.600 cm/s LVOT VTI:          0.275 m LVOT/AV VTI ratio: 0.94   AORTA Ao Root diam: 3.30 cm Ao Asc diam:  3.50 cm MITRAL VALVE MV Area (PHT): 2.52 cm     SHUNTS MV Decel Time: 302 msec     Systemic VTI:  0.27 m MV E velocity: 99.65 cm/s   Systemic Diam: 1.80 cm MV A velocity: 128.50 cm/s MV E/A ratio:  0.78 Belva Boyden MD Electronically signed by Belva Boyden MD Signature Date/Time: 05/09/2024/11:43:08 AM    Final    DG Chest Port 1 View Result Date: 05/07/2024 CLINICAL DATA:  Shortness of breath EXAM: PORTABLE CHEST 1 VIEW COMPARISON:  Chest radiograph dated 05/01/2024 FINDINGS: Patient is rotated slightly to the right. Low lung volumes with bronchovascular crowding. No focal consolidations. No pleural effusion or pneumothorax. The heart size and mediastinal contours are within normal limits. No acute osseous abnormality. IMPRESSION: Low lung volumes with bronchovascular crowding. No focal consolidations. Electronically Signed   By: Limin  Xu M.D.   On: 05/07/2024 19:13   PERIPHERAL VASCULAR CATHETERIZATION Result Date: 05/03/2024 See surgical note for result.  US  Venous Img Lower Bilateral (DVT) Result Date: 05/01/2024 CLINICAL DATA:  Pulmonary embolus EXAM: BILATERAL LOWER EXTREMITY VENOUS DOPPLER ULTRASOUND TECHNIQUE: Gray-scale sonography with compression, as well as color and duplex ultrasound, were performed to evaluate the deep venous system(s) from the level of the common femoral vein through the popliteal and proximal calf veins. COMPARISON:  CT PA performed May 01, 2024 FINDINGS: VENOUS Right lower extremity: Right common femoral and femoral veins are patent. The right popliteal vein is partially compressible. A peroneal vein is also identified on the right which is partially compressible. Left lower extremity: Left common femoral, femoral, popliteal veins are patent. The left peroneal vein is non compressible. OTHER None. Limitations: none IMPRESSION: 1. Partially occlusive thrombus is favored within the right popliteal vein. A right peroneal vein thrombus  is also favored. 2. Near occlusive thrombus is favored within a left peroneal vein. These results will be called to the ordering clinician or representative by the Radiologist Assistant, and communication documented in the PACS or [employer]. Electronically Signed   By: Reagan Camera M.D.   On: 05/01/2024 11:02   CT Angio Chest PE W/Cm &/Or Wo Cm Addendum Date: 05/01/2024 ADDENDUM REPORT: 05/01/2024 08:49 ADDENDUM: Critical Value/emergent results were called by telephone at the time of interpretation on 05/01/2024 at 8:35 am to Dr. Lind Repine, who verbally acknowledged these results. Electronically Signed   By: Beula Brunswick M.D.   On: 05/01/2024 08:49   Result Date: 05/01/2024 CLINICAL DATA:  Pulmonary embolism (PE) suspected, high prob; Bowel obstruction suspected LLQ abdominal pain RLQ abdominal pain. EXAM: CT ANGIOGRAPHY CHEST CT ABDOMEN AND PELVIS WITH CONTRAST TECHNIQUE: Multidetector CT imaging of the chest was performed using the standard protocol during bolus administration of intravenous contrast. Multiplanar CT  image reconstructions and MIPs were obtained to evaluate the vascular anatomy. Multidetector CT imaging of the abdomen and pelvis was performed using the standard protocol during bolus administration of intravenous contrast. RADIATION DOSE REDUCTION: This exam was performed according to the departmental dose-optimization program which includes automated exposure control, adjustment of the mA and/or kV according to patient size and/or use of iterative reconstruction technique. CONTRAST:  OMNIPAQUE  IOHEXOL  350 MG/ML SOLN COMPARISON:  CT angiography chest from 05/11/2021 and CT scan abdomen and pelvis from 01/24/2019. FINDINGS: CTA CHEST FINDINGS Cardiovascular: There is moderate-to-large volume acute occlusive and nonocclusive embolism extending from bilateral lobar pulmonary artery branches up to the subsegmental pulmonary artery branches throughout bilateral lungs. No right  heart strain. No frank lung infarction. Normal cardiac size. No pericardial effusion. No aortic aneurysm. There are coronary artery calcifications, in keeping with coronary artery disease. There are also mild peripheral atherosclerotic vascular calcifications of thoracic aorta and its major branches. Mediastinum/Nodes: Visualized thyroid  gland appears grossly unremarkable. No solid / cystic mediastinal masses. The esophagus is nondistended precluding optimal assessment. There are few mildly prominent mediastinal and hilar lymph nodes, which do not meet the size criteria for lymphadenopathy and appear mildly decreased in size since the prior study, favoring benign etiology. No axillary lymphadenopathy by size criteria. Lungs/Pleura: The central tracheo-bronchial tree is patent. There are diffuse patchy ground-glass opacities scattered throughout bilateral lungs, which is nonspecific. No mass or dense consolidation. No pleural effusion or pneumothorax. No suspicious lung nodules. Musculoskeletal: The visualized soft tissues of the chest wall are grossly unremarkable. No suspicious osseous lesions. There are mild multilevel degenerative changes in the visualized spine. Review of the MIP images confirms the above findings. CT ABDOMEN and PELVIS FINDINGS Hepatobiliary: The liver is normal in size. Non-cirrhotic configuration. No suspicious mass. No intrahepatic or extrahepatic bile duct dilation. Gallbladder is surgically absent. Pancreas: Unremarkable. No pancreatic ductal dilatation or surrounding inflammatory changes. Spleen: Within normal limits. No focal lesion. Adrenals/Urinary Tract: Adrenal glands are unremarkable. No suspicious renal mass. There is a partially exophytic 6.1 x 6.4 cm cyst arising from the right kidney interpolar region. There are additional smaller sinus cysts in the left kidney. No nephroureterolithiasis or obstructive uropathy. Unremarkable urinary bladder. Stomach/Bowel: There is a tiny  sliding hiatal hernia. No disproportionate dilation of the small or large bowel loops. No evidence of abnormal bowel wall thickening or inflammatory changes. The appendix was not visualized; however there is no acute inflammatory process in the right lower quadrant. There are multiple diverticula mainly in the sigmoid colon, without imaging signs of diverticulitis. Vascular/Lymphatic: No ascites or pneumoperitoneum. No abdominal or pelvic lymphadenopathy, by size criteria. No aneurysmal dilation of the major abdominal arteries. There are mild peripheral atherosclerotic vascular calcifications of the aorta and its major branches. Reproductive: The uterus is surgically absent. There is a fluid attenuation 1.9 x 2.3 cm structure in the right adnexa, incompletely characterized on the current exam but favored to represent a senescent ovarian cyst. No significant interval change since the prior study. No other large adnexal mass. Other: Redemonstration of pelvic floor prolapse with rectocele, grossly similar to the prior study. There is a small fat containing umbilical hernia. The soft tissues and abdominal wall are otherwise unremarkable. Musculoskeletal: No suspicious osseous lesions. There are mild - moderate multilevel degenerative changes in the visualized spine. Review of the MIP images confirms the above findings. IMPRESSION: 1. There is moderate-to-large volume acute occlusive and nonocclusive pulmonary embolism extending from bilateral lobar pulmonary artery branches up to  the subsegmental pulmonary artery branches throughout bilateral lungs. No right heart strain. No lung infarction. 2. No acute inflammatory process identified within the abdomen or pelvis. No bowel obstruction. 3. There are diffuse patchy ground-glass opacities scattered throughout bilateral lungs, which is nonspecific. Findings may represent sequela of infection or inflammation. Correlate clinically. 4. Multiple other nonacute observations, as  described above. Aortic Atherosclerosis (ICD10-I70.0). Electronically Signed: By: Beula Brunswick M.D. On: 05/01/2024 08:30   CT ABDOMEN PELVIS W CONTRAST Addendum Date: 05/01/2024 ADDENDUM REPORT: 05/01/2024 08:49 ADDENDUM: Critical Value/emergent results were called by telephone at the time of interpretation on 05/01/2024 at 8:35 am to Dr. Lind Repine, who verbally acknowledged these results. Electronically Signed   By: Beula Brunswick M.D.   On: 05/01/2024 08:49   Result Date: 05/01/2024 CLINICAL DATA:  Pulmonary embolism (PE) suspected, high prob; Bowel obstruction suspected LLQ abdominal pain RLQ abdominal pain. EXAM: CT ANGIOGRAPHY CHEST CT ABDOMEN AND PELVIS WITH CONTRAST TECHNIQUE: Multidetector CT imaging of the chest was performed using the standard protocol during bolus administration of intravenous contrast. Multiplanar CT image reconstructions and MIPs were obtained to evaluate the vascular anatomy. Multidetector CT imaging of the abdomen and pelvis was performed using the standard protocol during bolus administration of intravenous contrast. RADIATION DOSE REDUCTION: This exam was performed according to the departmental dose-optimization program which includes automated exposure control, adjustment of the mA and/or kV according to patient size and/or use of iterative reconstruction technique. CONTRAST:  OMNIPAQUE  IOHEXOL  350 MG/ML SOLN COMPARISON:  CT angiography chest from 05/11/2021 and CT scan abdomen and pelvis from 01/24/2019. FINDINGS: CTA CHEST FINDINGS Cardiovascular: There is moderate-to-large volume acute occlusive and nonocclusive embolism extending from bilateral lobar pulmonary artery branches up to the subsegmental pulmonary artery branches throughout bilateral lungs. No right heart strain. No frank lung infarction. Normal cardiac size. No pericardial effusion. No aortic aneurysm. There are coronary artery calcifications, in keeping with coronary artery disease. There are also  mild peripheral atherosclerotic vascular calcifications of thoracic aorta and its major branches. Mediastinum/Nodes: Visualized thyroid  gland appears grossly unremarkable. No solid / cystic mediastinal masses. The esophagus is nondistended precluding optimal assessment. There are few mildly prominent mediastinal and hilar lymph nodes, which do not meet the size criteria for lymphadenopathy and appear mildly decreased in size since the prior study, favoring benign etiology. No axillary lymphadenopathy by size criteria. Lungs/Pleura: The central tracheo-bronchial tree is patent. There are diffuse patchy ground-glass opacities scattered throughout bilateral lungs, which is nonspecific. No mass or dense consolidation. No pleural effusion or pneumothorax. No suspicious lung nodules. Musculoskeletal: The visualized soft tissues of the chest wall are grossly unremarkable. No suspicious osseous lesions. There are mild multilevel degenerative changes in the visualized spine. Review of the MIP images confirms the above findings. CT ABDOMEN and PELVIS FINDINGS Hepatobiliary: The liver is normal in size. Non-cirrhotic configuration. No suspicious mass. No intrahepatic or extrahepatic bile duct dilation. Gallbladder is surgically absent. Pancreas: Unremarkable. No pancreatic ductal dilatation or surrounding inflammatory changes. Spleen: Within normal limits. No focal lesion. Adrenals/Urinary Tract: Adrenal glands are unremarkable. No suspicious renal mass. There is a partially exophytic 6.1 x 6.4 cm cyst arising from the right kidney interpolar region. There are additional smaller sinus cysts in the left kidney. No nephroureterolithiasis or obstructive uropathy. Unremarkable urinary bladder. Stomach/Bowel: There is a tiny sliding hiatal hernia. No disproportionate dilation of the small or large bowel loops. No evidence of abnormal bowel wall thickening or inflammatory changes. The appendix was not visualized; however there is  no  acute inflammatory process in the right lower quadrant. There are multiple diverticula mainly in the sigmoid colon, without imaging signs of diverticulitis. Vascular/Lymphatic: No ascites or pneumoperitoneum. No abdominal or pelvic lymphadenopathy, by size criteria. No aneurysmal dilation of the major abdominal arteries. There are mild peripheral atherosclerotic vascular calcifications of the aorta and its major branches. Reproductive: The uterus is surgically absent. There is a fluid attenuation 1.9 x 2.3 cm structure in the right adnexa, incompletely characterized on the current exam but favored to represent a senescent ovarian cyst. No significant interval change since the prior study. No other large adnexal mass. Other: Redemonstration of pelvic floor prolapse with rectocele, grossly similar to the prior study. There is a small fat containing umbilical hernia. The soft tissues and abdominal wall are otherwise unremarkable. Musculoskeletal: No suspicious osseous lesions. There are mild - moderate multilevel degenerative changes in the visualized spine. Review of the MIP images confirms the above findings. IMPRESSION: 1. There is moderate-to-large volume acute occlusive and nonocclusive pulmonary embolism extending from bilateral lobar pulmonary artery branches up to the subsegmental pulmonary artery branches throughout bilateral lungs. No right heart strain. No lung infarction. 2. No acute inflammatory process identified within the abdomen or pelvis. No bowel obstruction. 3. There are diffuse patchy ground-glass opacities scattered throughout bilateral lungs, which is nonspecific. Findings may represent sequela of infection or inflammation. Correlate clinically. 4. Multiple other nonacute observations, as described above. Aortic Atherosclerosis (ICD10-I70.0). Electronically Signed: By: Beula Brunswick M.D. On: 05/01/2024 08:30   DG Chest 1 View Result Date: 05/01/2024 CLINICAL DATA:  Infection workup.  Weakness.  EXAM: CHEST  1 VIEW COMPARISON:  04/28/2024 FINDINGS: Heart size appears normal. Asymmetric elevation of right hemidiaphragm. Right lower lobe bronchial wall thickening is identified with new peribronchovascular opacities. No signs of pleural effusion or interstitial edema. Visualized osseous structures appear unremarkable. IMPRESSION: Right lower lobe bronchial wall thickening with new peribronchovascular opacities compatible with bronchitis and possible developing pneumonia. Electronically Signed   By: Kimberley Penman M.D.   On: 05/01/2024 06:44   DG Chest Portable 1 View Result Date: 04/28/2024 CLINICAL DATA:  Weakness EXAM: PORTABLE CHEST 1 VIEW COMPARISON:  06/21/2023 FINDINGS: Streaky atelectasis or scarring at the bases. No focal consolidation. Stable cardiomediastinal silhouette with aortic atherosclerosis. No pneumothorax IMPRESSION: Streaky atelectasis or scarring at the bases. Electronically Signed   By: Esmeralda Hedge M.D.   On: 04/28/2024 18:34    Microbiology: Results for orders placed or performed during the hospital encounter of 05/01/24  Resp panel by RT-PCR (RSV, Flu A&B, Covid) Anterior Nasal Swab     Status: None   Collection Time: 05/01/24  5:57 AM   Specimen: Anterior Nasal Swab  Result Value Ref Range Status   SARS Coronavirus 2 by RT PCR NEGATIVE NEGATIVE Final    Comment: (NOTE) SARS-CoV-2 target nucleic acids are NOT DETECTED.  The SARS-CoV-2 RNA is generally detectable in upper respiratory specimens during the acute phase of infection. The lowest concentration of SARS-CoV-2 viral copies this assay can detect is 138 copies/mL. A negative result does not preclude SARS-Cov-2 infection and should not be used as the sole basis for treatment or other patient management decisions. A negative result may occur with  improper specimen collection/handling, submission of specimen other than nasopharyngeal swab, presence of viral mutation(s) within the areas targeted by this assay,  and inadequate number of viral copies(<138 copies/mL). A negative result must be combined with clinical observations, patient history, and epidemiological information. The expected result is Negative.  Fact Sheet for Patients:  BloggerCourse.com  Fact Sheet for Healthcare Providers:  SeriousBroker.it  This test is no t yet approved or cleared by the [place]  FDA and  has been authorized for detection and/or diagnosis of SARS-CoV-2 by FDA under an Emergency Use Authorization (EUA). This EUA will remain  in effect (meaning this test can be used) for the duration of the COVID-19 declaration under Section 564(b)(1) of the Act, 21 U.S.C.section 360bbb-3(b)(1), unless the authorization is terminated  or revoked sooner.       Influenza A by PCR NEGATIVE NEGATIVE Final   Influenza B by PCR NEGATIVE NEGATIVE Final    Comment: (NOTE) The Xpert Xpress SARS-CoV-2/FLU/RSV plus assay is intended as an aid in the diagnosis of influenza from Nasopharyngeal swab specimens and should not be used as a sole basis for treatment. Nasal washings and aspirates are unacceptable for Xpert Xpress SARS-CoV-2/FLU/RSV testing.  Fact Sheet for Patients: BloggerCourse.com  Fact Sheet for Healthcare Providers: SeriousBroker.it  This test is not yet approved or cleared by the [place]  FDA and has been authorized for detection and/or diagnosis of SARS-CoV-2 by FDA under an Emergency Use Authorization (EUA). This EUA will remain in effect (meaning this test can be used) for the duration of the COVID-19 declaration under Section 564(b)(1) of the Act, 21 U.S.C. section 360bbb-3(b)(1), unless the authorization is terminated or revoked.     Resp Syncytial Virus by PCR NEGATIVE NEGATIVE Final    Comment: (NOTE) Fact Sheet for Patients: BloggerCourse.com  Fact Sheet for  Healthcare Providers: SeriousBroker.it  This test is not yet approved or cleared by the [place]  FDA and has been authorized for detection and/or diagnosis of SARS-CoV-2 by FDA under an Emergency Use Authorization (EUA). This EUA will remain in effect (meaning this test can be used) for the duration of the COVID-19 declaration under Section 564(b)(1) of the Act, 21 U.S.C. section 360bbb-3(b)(1), unless the authorization is terminated or revoked.  Performed at [facility], [address] [place], [place] [address]     Labs: CBC: Recent Labs  Lab 05/07/24 1843  WBC 5.2  HGB 12.4  HCT 36.7  MCV 92.9  PLT 279   Basic Metabolic Panel: Recent Labs  Lab 05/07/24 1843 05/10/24 0453 05/12/24 0923  NA 133* 133* 132*  K 3.5 3.4* 3.9  CL 100 99 100  CO2 24 27 27   GLUCOSE 97 98 87  BUN 12 10 8   CREATININE 0.67 0.70 0.69  CALCIUM  8.3* 8.1* 8.3*   Liver Function Tests: No results for input(s): "AST", "ALT", "ALKPHOS", "BILITOT", "PROT", "ALBUMIN" in the last 168 hours. CBG: No results for input(s): "GLUCAP" in the last 168 hours.  Discharge time spent: less than 30 minutes.  Signed: Montey Apa, DO [facility] 05/13/2024

## 2024-05-13 NOTE — Progress Notes (Signed)
 Occupational Therapy Treatment Patient Details Name: Rachel Harris MRN: [id number] DOB: 04-09-1935 Today's Date: 05/13/2024   History of present illness 89 y.o. female presenting with shortness of breath and PE. PMH of GERD, hypertension, shingles, depression, chronic estradiol use, recent PE and BLE DVT s/p pulmonary thrombectomy 5/9.   OT comments  Pt is found seated on BSC on arrival. Pleasant and agreeable to OT session. She reports 2-3/10 nagging headache pain that is much improved after meds received from nurse this morning. Pt performed seated peri-care with supervision. CGA for STS from [facility] and SPT back to EOB for seated rest break prior to performing standing oral care and hand hygiene. Pt placed on RA for session with lowest sp02 reading of 93%. Pt continues to fatigue very easily needing frequent rest breaks and increased time to perform all tasks. She demo LB dressing to don socks with supervision via lateral leans. Min A needed for UB dressing to don housecoat while in standing at RW. Continues to require CGA for all standing balance activities and unilateral support on surfaces. Tolerated ~20 feet of mobility this date using RW before wising to return to supine and deferring sponge bath d/t fatigue. She remains a high fall risk d/t fatigue and need for RW with inability to safely perform higher level IADLs and ADLs requiring extended energy, e.g. showering. Pt returned to bed with all needs in place and will cont to require skilled acute OT services to maximize her safety and IND to return to PLOF.       If plan is discharge home, recommend the following:  A little help with walking and/or transfers;Assist for transportation;Help with stairs or ramp for entrance;A lot of help with bathing/dressing/bathroom;Assistance with cooking/housework   Equipment Recommendations  Other (comment);BSC/3in1;Tub/shower seat (defer)    Recommendations for Other Services      Precautions /  Restrictions Precautions Precautions: Fall Recall of Precautions/Restrictions: Intact Restrictions Weight Bearing Restrictions Per Provider Order: No       Mobility Bed Mobility Overal bed mobility: Modified Independent Bed Mobility: Sit to Supine       Sit to supine: Modified independent (Device/Increase time)   General bed mobility comments: increased time with use of bed rails, no physical assist    Transfers Overall transfer level: Needs assistance Equipment used: Rolling walker (2 wheels) Transfers: Sit to/from Stand Sit to Stand: Contact guard assist     Step pivot transfers: Contact guard assist     General transfer comment: STS from BSC, SPT back to bed, STS from EOB and mobility to door and back ~20 feet on RA using RW     Balance Overall balance assessment: Needs assistance Sitting-balance support: Feet supported Sitting balance-Leahy Scale: Good     Standing balance support: During functional activity, Reliant on assistive device for balance, Single extremity supported Standing balance-Leahy Scale: Fair Standing balance comment: RW use and CGA                           ADL either performed or assessed with clinical judgement   ADL Overall ADL's : Needs assistance/impaired     Grooming: Wash/dry hands;Standing;Contact guard assist;Oral care           Upper Body Dressing : Minimal assistance;Standing   Lower Body Dressing: Supervision/safety;Sitting/lateral leans Lower Body Dressing Details (indicate cue type and reason): to don bil socks Toilet Transfer: Contact guard assist;Rolling walker (2 wheels);BSC/3in1;Stand-pivot   Toileting- Clothing Manipulation and Hygiene:  Supervision/safety;Sitting/lateral lean       Functional mobility during ADLs: Contact guard assist;Rolling walker (2 wheels)      Extremity/Trunk Assessment Upper Extremity Assessment Upper Extremity Assessment: Generalized weakness   Lower Extremity  Assessment Lower Extremity Assessment: Generalized weakness        Vision       Perception     Praxis     Communication Communication Communication: No apparent difficulties   Cognition Arousal: Alert Behavior During Therapy: WFL for tasks assessed/performed                                 Following commands: Intact        Cueing   Cueing Techniques: Verbal cues  Exercises Other Exercises Other Exercises: Edu on ECS, use of AE/AD, DME and pacing/rest breaks being incorporated into all daily tasks/transfers.    Shoulder Instructions       General Comments 93% lowest sp02 on RA during session    Pertinent Vitals/ Pain       Pain Assessment Pain Assessment: 0-10 Pain Score: 3  Pain Location: headache Pain Descriptors / Indicators: Nagging Pain Intervention(s): Monitored during session, Repositioned  Home Living                                          Prior Functioning/Environment              Frequency  Min 2X/week        Progress Toward Goals  OT Goals(current goals can now be found in the care plan section)  Progress towards OT goals: Progressing toward goals  Acute Rehab OT Goals Patient Stated Goal: improve strength OT Goal Formulation: With patient Time For Goal Achievement: 05/22/24 Potential to Achieve Goals: Fair  Plan      Co-evaluation                 AM-PAC OT "6 Clicks" Daily Activity     Outcome Measure   Help from another person eating meals?: None Help from another person taking care of personal grooming?: None Help from another person toileting, which includes using toliet, bedpan, or urinal?: A Little Help from another person bathing (including washing, rinsing, drying)?: A Lot Help from another person to put on and taking off regular upper body clothing?: A Little Help from another person to put on and taking off regular lower body clothing?: A Little 6 Click Score: 19    End  of Session Equipment Utilized During Treatment: Rolling walker (2 wheels);Gait belt  OT Visit Diagnosis: Other abnormalities of gait and mobility (R26.89);Muscle weakness (generalized) (M62.81)   Activity Tolerance Patient tolerated treatment well   Patient Left in bed;with call bell/phone within reach;with bed alarm set   Nurse Communication Mobility status        Time: 9562-1308 OT Time Calculation (min): 23 min  Charges: OT General Charges $OT Visit: 1 Visit OT Treatments $Self Care/Home Management : 8-22 mins $Therapeutic Activity: 8-22 mins  Quavion Boule, OTR/L  05/13/24, 10:37 AM   Leonard Raker 05/13/2024, 10:33 AM

## 2024-05-13 NOTE — TOC Progression Note (Addendum)
 Transition of Care [facility]) - Progression Note    Patient Details  Name: YEN WANDELL MRN: [id number] Date of Birth: [birth year]  [facility] CM/SW Contact  Loman Risk, RN Phone Number: 05/13/2024, 11:41 AM  Clinical Narrative:    PT has upgraded recs to home health.  Per PT patient and daughters in agreement.   Patient confirms plan and that she has RW in the home.  She states that daughter Shelvy Dickens will transport at discharge Patient agreeable to home health.  States she does not have a preference of home health agency.  Referral made and accepted by shaun with [facility]    Update:  shaun with [facility] notified of discharge  Expected Discharge Plan: Skilled Nursing Facility    Expected Discharge Plan and Services       Living arrangements for the past 2 months: Single Family Home                                       Social Determinants of Health (SDOH) Interventions SDOH Screenings   Food Insecurity: No Food Insecurity (05/07/2024)  Housing: Low Risk  (05/07/2024)  Transportation Needs: No Transportation Needs (05/07/2024)  Utilities: Not At Risk (05/07/2024)  Financial Resource Strain: Low Risk  (04/24/2024)   Received from [facility]  Social Connections: Moderately Integrated (05/07/2024)  Tobacco Use: Low Risk  (05/07/2024)    Readmission Risk Interventions     No data to display

## 2024-05-13 NOTE — Progress Notes (Signed)
 Initial Nutrition Assessment  DOCUMENTATION CODES:   Not applicable  INTERVENTION:   -D/c Ensure Enlive po TID, each supplement provides 350 kcal and 20 grams of protein.  -Boost Breeze po TID, each supplement provides 250 kcal and 9 grams of protein  -Magic cup TID with meals, each supplement provides 290 kcal and 9 grams of protein  -MVI with minerals daily -Liberalize diet to regular for widest variety of meal selections   NUTRITION DIAGNOSIS:   Inadequate oral intake related to decreased appetite as evidenced by per patient/family report.  GOAL:   Patient will meet greater than or equal to 90% of their needs  MONITOR:   PO intake, Supplement acceptance  REASON FOR ASSESSMENT:   Consult Assessment of nutrition requirement/status  ASSESSMENT:   Pt with medical history significant for Hypertension,  recent (05/01/2024 ) large-volume PE  and bilateral lower extremity DVT attributed to chronic estradiol use, undergoing pulmonary thrombectomy on 5/9 and discharged on Eliquis  starter pack returns with weakness and worsening shortness of breath on exertion in spite of compliance with anticoagulant.  Pt admitted with dyspnea on exertions, history of large volume PE s/p pulmonary thrombectomy, and bilateral DVT.   Reviewed I/O's: +720 ml x 24 hours and +2 L since admission  Spoke with pt and daughter at bedside. Both pleasant and in good spirits today. Pt reports feeling much better after being able to use the bathroom after bout of constipation.   Per daughter, pt with decreased oral intake over the past few days PTA. which has improved. Pt with esophageal dysmotility and reports that she was feeling lick foods was "stuck in her throat". She has been trying to eat softer foods and chew her food thoroughly, which has helped. Pt had nausea, which impacted her oral intake, however, this has resolved. Noted meal completions 75-100%.   Pt tried Ensure, but does not like due to  medicinal taste. She is requesting Randeen Busman, which RD will order.   Pt endorses wt loss, but unsure how much and UBW. Pt has experienced a 4.6% wt loss over the past month, which is not significant for time frame. Pt endorses decrease in functionality and feels overwhelmed by recent illnesses. Emotional support provided.    Discussed importance of good meal and supplement intake to promote healing., Affirmed efforts of chewing slowly and thoroughly and softer foods choices, Offered to downgrade diet for ease of intake or liberalize diet to regular for wider variety of meal selections. Pt prefers regular diet so she can pick and choose what she feels she is able to tolerate.   Per TOC notes, plan for home with home health services.   Medications reviewed and include protonix  and miralax .   Labs reviewed: Na: 132, CBGS: 90 (inpatient orders for glycemic control are none).    NUTRITION - FOCUSED PHYSICAL EXAM:  Flowsheet Row Most Recent Value  Orbital Region No depletion  Upper Arm Region Mild depletion  Thoracic and Lumbar Region No depletion  Buccal Region No depletion  Temple Region Mild depletion  Clavicle Bone Region No depletion  Clavicle and Acromion Bone Region No depletion  Scapular Bone Region No depletion  Dorsal Hand Mild depletion  Patellar Region Mild depletion  Anterior Thigh Region Mild depletion  Posterior Calf Region Mild depletion  Edema (RD Assessment) None  Hair Reviewed  Eyes Reviewed  Mouth Reviewed  Skin Reviewed  Nails Reviewed       Diet Order:   Diet Order  Diet regular Fluid consistency: Thin  Diet effective now                   EDUCATION NEEDS:   Education needs have been addressed  Skin:  Skin Assessment: Reviewed RN Assessment  Last BM:  05/10/24  Height:   Ht Readings from Last 1 Encounters:  05/07/24 5\' 6"  (1.676 m)    Weight:   Wt Readings from Last 1 Encounters:  05/07/24 79.2 kg    Ideal Body Weight:   59.1 kg  BMI:  Body mass index is 28.18 kg/m.  Estimated Nutritional Needs:   Kcal:  1500-1700  Protein:  75-90 grams  Fluid:  1.5-1.7 L    Herschel Lords, RD, LDN, CDCES Registered Dietitian III Certified Diabetes Care and Education Specialist If unable to reach this RD, please use "RD Inpatient" group chat on secure chat between hours of 8am-4 pm daily

## 2024-05-13 NOTE — Progress Notes (Signed)
 Physical Therapy Treatment Patient Details Name: Rachel Harris MRN: [id number] DOB: 03-Aug-1935 Today's Date: 05/13/2024   History of Present Illness Pt is an 89 y.o. female presenting with shortness of breath and PE. PMH of GERD, hypertension, shingles, depression, chronic estradiol use, recent PE and BLE DVT s/p pulmonary thrombectomy 5/9.    PT Comments  Pt was pleasant and motivated to participate during the session and put forth good effort throughout. Pt required no physical assistance during the session and was steady with transfers and gait with no overt LOB.  Pt was able to ascend/descend 4 steps with one rail with good eccentric and concentric control and reported no adverse symptoms during the session other than her HA with SpO2 and HR WNL on room air.  Pt pre-medicated for her HA and reported feeling significant improvement in her pain.  Pt will benefit from continued PT services upon discharge to safely address deficits listed in patient problem list for decreased caregiver assistance and eventual return to PLOF.      If plan is discharge home, recommend the following: A little help with walking and/or transfers;A little help with bathing/dressing/bathroom;Assist for transportation;Help with stairs or ramp for entrance   Can travel by private vehicle     Yes  Equipment Recommendations  None recommended by PT    Recommendations for Other Services       Precautions / Restrictions Precautions Precautions: Fall Recall of Precautions/Restrictions: Intact Restrictions Weight Bearing Restrictions Per Provider Order: No     Mobility  Bed Mobility Overal bed mobility: Modified Independent             General bed mobility comments: Min extra time and effort only, no physical assist needed    Transfers Overall transfer level: Needs assistance Equipment used: Rolling walker (2 wheels) Transfers: Sit to/from Stand Sit to Stand: Supervision           General  transfer comment: Good eccentric and concentric control and stability from various height surfaces    Ambulation/Gait Ambulation/Gait assistance: Supervision Gait Distance (Feet): 100 Feet Assistive device: Rolling walker (2 wheels) Gait Pattern/deviations: Step-through pattern, Decreased step length - right, Decreased step length - left, Trunk flexed Gait velocity: decreased     General Gait Details: Slow cadence but steady with no overt LOB, min cues for amb closer to the RW with upright posture   Stairs Stairs: Yes Stairs assistance: Supervision Stair Management: One rail Left, Step to pattern, Forwards Number of Stairs: 4 General stair comments: Pt able to ascend and descend 4 steps with BUEs on the L rail with good control and stability throughout with min visual cues for proper sequencing   Wheelchair Mobility     Tilt Bed    Modified Rankin (Stroke Patients Only)       Balance Overall balance assessment: Needs assistance Sitting-balance support: Feet supported Sitting balance-Leahy Scale: Normal     Standing balance support: During functional activity, Bilateral upper extremity supported Standing balance-Leahy Scale: Good                              Communication Communication Communication: No apparent difficulties  Cognition Arousal: Alert Behavior During Therapy: WFL for tasks assessed/performed   PT - Cognitive impairments: No apparent impairments                         Following commands: Intact  Cueing Cueing Techniques: Verbal cues  Exercises      General Comments General comments (skin integrity, edema, etc.): 93% lowest sp02 on RA during session      Pertinent Vitals/Pain Pain Assessment Pain Assessment: 0-10 Pain Score: 3  Pain Location: headache Pain Descriptors / Indicators: Headache Pain Intervention(s): Monitored during session, Premedicated before session    Home Living                           Prior Function            PT Goals (current goals can now be found in the care plan section) Progress towards PT goals: Progressing toward goals    Frequency    Min 2X/week      PT Plan      Co-evaluation              AM-PAC PT "6 Clicks" Mobility   Outcome Measure  Help needed turning from your back to your side while in a flat bed without using bedrails?: A Little Help needed moving from lying on your back to sitting on the side of a flat bed without using bedrails?: A Little Help needed moving to and from a bed to a chair (including a wheelchair)?: A Little Help needed standing up from a chair using your arms (e.g., wheelchair or bedside chair)?: A Little Help needed to walk in hospital room?: A Little Help needed climbing 3-5 steps with a railing? : A Little 6 Click Score: 18    End of Session Equipment Utilized During Treatment: Gait belt Activity Tolerance: Patient tolerated treatment well Patient left: in bed;with call bell/phone within reach;with family/visitor present;with bed alarm set Nurse Communication: Mobility status PT Visit Diagnosis: Muscle weakness (generalized) (M62.81);Difficulty in walking, not elsewhere classified (R26.2)     Time: 9147-8295 PT Time Calculation (min) (ACUTE ONLY): 37 min  Charges:    $Gait Training: 23-37 mins PT General Charges $$ ACUTE PT VISIT: 1 Visit                     D. Scott Char Feltman PT, DPT 05/13/24, 11:37 AM

## 2024-05-14 ENCOUNTER — Encounter (INDEPENDENT_AMBULATORY_CARE_PROVIDER_SITE_OTHER): Payer: Self-pay

## 2024-05-17 NOTE — Telephone Encounter (Signed)
 Spoke with daughter and she states understanding

## 2024-05-21 ENCOUNTER — Telehealth (INDEPENDENT_AMBULATORY_CARE_PROVIDER_SITE_OTHER): Payer: Self-pay

## 2024-05-21 NOTE — Telephone Encounter (Signed)
 I agree, BP meds were d/c'd per the hospitalist not us .  She should reach out to PCP for management

## 2024-05-21 NOTE — Telephone Encounter (Signed)
 Patient daughter left a message stating that her mother's b/p was 157/76 this morning. Patient daughter stated that her mother b/p medication was discontinued after the pulmonary thrombectomy done on 05/03/24. I did advise patient daughter to contact her mother's PCP. Please Advise

## 2024-05-21 NOTE — Telephone Encounter (Signed)
 Left a detailed message on patient daughter Jenette Mitchell) voicemail.

## 2024-05-28 ENCOUNTER — Other Ambulatory Visit (INDEPENDENT_AMBULATORY_CARE_PROVIDER_SITE_OTHER): Payer: Self-pay | Admitting: Vascular Surgery

## 2024-05-28 DIAGNOSIS — I82453 Acute embolism and thrombosis of peroneal vein, bilateral: Secondary | ICD-10-CM

## 2024-05-28 DIAGNOSIS — I82431 Acute embolism and thrombosis of right popliteal vein: Secondary | ICD-10-CM

## 2024-05-31 ENCOUNTER — Ambulatory Visit (INDEPENDENT_AMBULATORY_CARE_PROVIDER_SITE_OTHER): Payer: Self-pay | Admitting: Nurse Practitioner

## 2024-05-31 ENCOUNTER — Encounter (INDEPENDENT_AMBULATORY_CARE_PROVIDER_SITE_OTHER): Payer: Self-pay | Admitting: Nurse Practitioner

## 2024-05-31 ENCOUNTER — Ambulatory Visit (INDEPENDENT_AMBULATORY_CARE_PROVIDER_SITE_OTHER): Payer: Self-pay

## 2024-05-31 VITALS — BP 129/74 | HR 80 | Resp 16

## 2024-05-31 DIAGNOSIS — I10 Essential (primary) hypertension: Secondary | ICD-10-CM

## 2024-05-31 DIAGNOSIS — I82453 Acute embolism and thrombosis of peroneal vein, bilateral: Secondary | ICD-10-CM | POA: Diagnosis not present

## 2024-05-31 DIAGNOSIS — I82433 Acute embolism and thrombosis of popliteal vein, bilateral: Secondary | ICD-10-CM | POA: Diagnosis not present

## 2024-05-31 DIAGNOSIS — I2609 Other pulmonary embolism with acute cor pulmonale: Secondary | ICD-10-CM | POA: Diagnosis not present

## 2024-05-31 DIAGNOSIS — I82431 Acute embolism and thrombosis of right popliteal vein: Secondary | ICD-10-CM

## 2024-06-10 ENCOUNTER — Encounter (INDEPENDENT_AMBULATORY_CARE_PROVIDER_SITE_OTHER): Payer: Self-pay | Admitting: Nurse Practitioner

## 2024-06-10 NOTE — Progress Notes (Signed)
 Subjective:    Patient ID: Rachel Harris, female    DOB: 21-Oct-1935, 89 y.o.   MRN: [id number] Chief Complaint  Patient presents with   Follow-up    [facility] 4 week with dvt u/s    The patient returns to the office for followup and review status post pulmonary thrombectomy with intervention on 05/03/2024.   Procedure: Procedure(s) Performed:             1.  Contrast injection right heart and bilateral pulmonary arteries             2.  Mechanical thrombectomy bilateral lobar pulmonary arteries for removal of pulmonary emboli using the Penumbra CAT 8 thrombectomy catheter.             3.  Selective catheter placement right upper lobe pulmonary artery, middle lobe pulmonary artery and lower lobe pulmonary artery             4.  Selective catheter placement left upper lobe pulmonary artery and lower lobe pulmonary artery   The patient notes improvement in the breathing however she notes that she still continues to struggle to get back to her baseline.  She remains on Eliquis  without any significant issues.  During this time it was found that she had a left popliteal DVT as well as bilateral peroneal DVTs as well.    Review of Systems  Constitutional:  Positive for activity change and fatigue.  Cardiovascular:  Positive for leg swelling.  Neurological:  Positive for weakness.  All other systems reviewed and are negative.      Objective:   Physical Exam Vitals reviewed.  HENT:     Head: Normocephalic.   Cardiovascular:     Rate and Rhythm: Normal rate.  Pulmonary:     Effort: Pulmonary effort is normal.   Skin:    General: Skin is warm and dry.   Neurological:     Mental Status: She is alert and oriented to person, place, and time.   Psychiatric:        Mood and Affect: Mood normal.        Behavior: Behavior normal.        Thought Content: Thought content normal.        Judgment: Judgment normal.     BP 129/74   Pulse 80   Resp 16   Past Medical History:   Diagnosis Date   Anxiety    Barrett esophagus    Cataract cortical, senile    Degenerative disc disease, cervical    Depression    Diverticulosis    Environmental allergies    Exostosis    Fibrocystic breast disease    Fibrocystic breast disease    Gastritis    GERD (gastroesophageal reflux disease)    Hallux valgus    HH (hiatus hernia)    History of degenerative disc disease    History of shingles    Hypercholesterolemia    Hyperlipidemia    Hypertension    Internal hemorrhoids    Menopausal syndrome    Recurrent sinus infections    Vaginitis     Social History   Socioeconomic History   Marital status: Widowed    Spouse name: Not on file   Number of children: 2   Years of education: Not on file   Highest education level: Not on file  Occupational History   Not on file  Tobacco Use   Smoking status: Never   Smokeless tobacco: Never  Substance and  Sexual Activity   Alcohol use: No   Drug use: No   Sexual activity: Not on file  Other Topics Concern   Not on file  Social History Narrative   She lives alone.    Social Drivers of Corporate investment banker Strain: Low Risk  (04/24/2024)   Received [facility]   Overall Financial Resource Strain (CARDIA)    Difficulty of Paying Living Expenses: Not hard at all  Food Insecurity: No Food Insecurity (05/07/2024)   Hunger Vital Sign    Worried About Running Out of Food in the Last Year: Never true    Ran Out of Food in the Last Year: Never true  Transportation Needs: No Transportation Needs (05/07/2024)   PRAPARE - Administrator, Civil Service (Medical): No    Lack of Transportation (Non-Medical): No  Physical Activity: Not on file  Stress: Not on file  Social Connections: Moderately Integrated (05/07/2024)   Social Connection and Isolation Panel    Frequency of Communication with Friends and Family: More than three times a week    Frequency of Social Gatherings with Friends  and Family: More than three times a week    Attends Religious Services: More than 4 times per year    Active Member of [employer] or Organizations: Yes    Attends Banker Meetings: More than 4 times per year    Marital Status: Widowed  Intimate Partner Violence: Not At Risk (05/07/2024)   Humiliation, Afraid, Rape, and Kick questionnaire    Fear of Current or Ex-Partner: No    Emotionally Abused: No    Physically Abused: No    Sexually Abused: No    Past Surgical History:  Procedure Laterality Date   ABDOMINAL HYSTERECTOMY     APPENDECTOMY     BREAST BIOPSY Left 2015   APOCRINE CYST   BREAST CYST ASPIRATION Left 2015   neg   BREAST SURGERY     biopsy   CATARACT EXTRACTION W/ INTRAOCULAR LENS IMPLANTW/ TRABECULECTOMY     CHOLECYSTECTOMY     COLONOSCOPY WITH PROPOFOL  N/A 08/25/2015   Procedure: COLONOSCOPY WITH PROPOFOL ;  Surgeon: Deveron Fly, MD;  Location: [facility] ENDOSCOPY;  Service: Endoscopy;  Laterality: N/A;   ESOPHAGOGASTRODUODENOSCOPY (EGD) WITH PROPOFOL  N/A 08/25/2015   Procedure: ESOPHAGOGASTRODUODENOSCOPY (EGD) WITH PROPOFOL ;  Surgeon: Deveron Fly, MD;  Location: [facility] ENDOSCOPY;  Service: Endoscopy;  Laterality: N/A;   ESOPHAGOGASTRODUODENOSCOPY (EGD) WITH PROPOFOL  N/A 02/20/2018   Procedure: ESOPHAGOGASTRODUODENOSCOPY (EGD) WITH PROPOFOL ;  Surgeon: Deveron Fly, MD;  Location: [facility] ENDOSCOPY;  Service: Endoscopy;  Laterality: N/A;   HERNIA REPAIR     LESION REMOVAL Left 04/19/2024   Procedure: EXCISION, LESION, NECK;  Surgeon: Conrado Delay, DO;  Location: [facility] ORS;  Service: General;  Laterality: Left;   NISSEN FUNDOPLICATION     PULMONARY THROMBECTOMY Bilateral 05/03/2024   Procedure: PULMONARY THROMBECTOMY;  Surgeon: Jackquelyn Mass, MD;  Location: [facility] INVASIVE CV LAB;  Service: Cardiovascular;  Laterality: Bilateral;   TONSILLECTOMY      Family History  Problem Relation Age of Onset   Breast cancer Sister 46        2 sisters   Breast  cancer Paternal Aunt    Breast cancer Other     Allergies  Allergen Reactions   Ciprofloxacin Rash   Amlodipine Swelling   Amoxicillin    Ceclor [Cefaclor]    Codeine    Erythromycin    Levaquin [Levofloxacin In D5w]  Macrodantin [Nitrofurantoin Macrocrystal]    Zithromax [Azithromycin]    Hydrochlorothiazide Rash    dehydation   Sulfa Antibiotics Rash       Latest Ref Rng & Units 05/07/2024    6:43 PM 05/05/2024    4:48 AM 05/02/2024    5:39 AM  CBC  WBC 4.0 - 10.5 K/uL 5.2  4.8  6.5   Hemoglobin 12.0 - 15.0 g/dL 16.1  09.6  04.5   Hematocrit 36.0 - 46.0 % 36.7  32.6  35.5   Platelets 150 - 400 K/uL 279  216  179       CMP     Component Value Date/Time   NA 132 (L) 05/12/2024 0923   K 3.9 05/12/2024 0923   CL 100 05/12/2024 0923   CO2 27 05/12/2024 0923   GLUCOSE 87 05/12/2024 0923   BUN 8 05/12/2024 0923   CREATININE 0.69 05/12/2024 0923   CALCIUM  8.3 (L) 05/12/2024 0923   PROT 6.3 (L) 05/02/2024 0539   ALBUMIN 2.8 (L) 05/02/2024 0539   AST 16 05/02/2024 0539   ALT 16 05/02/2024 0539   ALKPHOS 73 05/02/2024 0539   BILITOT 0.8 05/02/2024 0539   GFRNONAA >60 05/12/2024 0923     No results found.     Assessment & Plan:   1. Other pulmonary embolism with acute cor pulmonale, unspecified chronicity (HCC) (Primary) The patient had a significant pulmonary embolism and based on this it is recommended that she remain on Eliquis  for 1 year.  Will have the patient return at that time to discuss options for anticoagulation.  2. Acute deep vein thrombosis (DVT) of popliteal vein of both lower extremities (HCC) Recommend:   No surgery or intervention at this point in time.  IVC filter is not indicated at present.  Patient's duplex ultrasound of the venous system shows resolution of DVT.  The patient is on anticoagulation   Elevation was stressed, such as the use of a recliner.  I have reviewed with the patient DVT and post phlebitic changes such as  swelling and why it  causes symptoms such as pain.  I recommended to the patient to wear graduated compression stockings, beginning after three full days of anticoagulation.  Graduated compression should be worn on a daily basis. The patient should wear compression beginning first thing in the morning and removing them in the evening. The patient is instructed specifically not to sleep in the stockings.  In addition, behavioral modification including elevation during the day and avoidance of prolonged dependency will be initiated.    The patient will continue anticoagulation for now as there have not been any problems or complications from anticoagulation therapy at this point.    3. Primary hypertension Continue antihypertensive medications as already ordered, these medications have been reviewed and there are no changes at this time.   Current Outpatient Medications on File Prior to Visit  Medication Sig Dispense Refill   acetaminophen  (TYLENOL ) 650 MG CR tablet Take 650-1,300 mg by mouth every 8 (eight) hours as needed for pain.     apixaban  (ELIQUIS ) 5 MG TABS tablet Take 1 tablet (5 mg total) by mouth 2 (two) times daily. 60 tablet 4   calcium  carbonate (TUMS - DOSED IN MG ELEMENTAL CALCIUM ) 500 MG chewable tablet Chew 2 tablets (400 mg of elemental calcium  total) by mouth 2 (two) times daily as needed for indigestion or heartburn.     dicyclomine  (BENTYL ) 10 MG capsule Take 1 capsule (10 mg total) by mouth  3 (three) times daily as needed (abdominal cramping pain). 30 capsule 0   diltiazem (CARDIZEM CD) 120 MG 24 hr capsule Take 120 mg by mouth daily.     feeding supplement (ENSURE ENLIVE / ENSURE PLUS) LIQD Take 237 mLs by mouth 3 (three) times daily between meals.     ipratropium (ATROVENT) 0.03 % nasal spray Place 2 sprays into both nostrils in the morning.     omeprazole (PRILOSEC) 20 MG capsule Take 20 mg by mouth in the morning.     polyethylene glycol (MIRALAX  / GLYCOLAX ) 17 g packet  Take 17 g by mouth daily.     Probiotic Product (PROBIOTIC DAILY PO) Take by mouth.     traMADol  (ULTRAM ) 50 MG tablet Take 1 tablet (50 mg total) by mouth as needed for severe pain (pain score 7-10). 6 tablet 0   valACYclovir  (VALTREX ) 1000 MG tablet Take 1,000 mg by mouth 3 (three) times daily.     No current facility-administered medications on file prior to visit.    There are no Patient Instructions on file for this visit. No follow-ups on file.   Brandonn Capelli E Mizraim Harmening, NP

## 2024-08-14 ENCOUNTER — Other Ambulatory Visit: Payer: Self-pay | Admitting: Family Medicine

## 2024-08-14 DIAGNOSIS — M5416 Radiculopathy, lumbar region: Secondary | ICD-10-CM

## 2024-08-19 ENCOUNTER — Ambulatory Visit
Admission: RE | Admit: 2024-08-19 | Discharge: 2024-08-19 | Disposition: A | Discharge status: Home / Self Care | Source: Ambulatory Visit | Attending: Family Medicine | Admitting: Family Medicine

## 2024-08-19 DIAGNOSIS — M5416 Radiculopathy, lumbar region: Secondary | ICD-10-CM

## 2024-10-01 NOTE — Progress Notes (Signed)
    Assessment & Plan:  1. Cough (Primary) - CXR 2view; Future - CBC w/Diff; Future - IgE; Future   Patient Instructions  1.  I recommend that you take Zyrtec (sertraline) 1 tablet at bedtime  2.  Use the Flovent  2 puffs twice a day.  Rinse your mouth well after use it.  Put a little baking soda in the water that you used to rinse with.  3.  We will schedule breathing tests, and blood tests as well as a chest x-ray.  This will help us  determine what the cause of your cough is.  In many circumstances because of the cough cannot be ascertained.  It is a matter of trial and error to try to figure out what is triggering it.  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  Please note: late entry documentation due to logistical difficulties during COVID-19 pandemic. This note is filed for information purposes only, and is not intended to be used for billing, nor does it represent the full scope/nature of the visit in question. Please see any associated scanned media linked to date of encounter for additional pertinent information.  Subjective:    HPI: Rachel Harris is a 89 y.o. female presenting to the pulmonology clinic on 11/12/2019 with report of: pulmonary consult (per [facility]- developed cough 04/2019. cough has improved with 2 round of prednisone and abx but is still present. prod cough with yellow to green mucus and sob mainly in the morning. cough worsens in the morning. )     Outpatient Encounter Medications as of 11/12/2019  Medication Sig   omeprazole (PRILOSEC) 20 MG capsule Take 20 mg by mouth in the morning.   [DISCONTINUED] amitriptyline (ELAVIL) 25 MG tablet Take 25 mg by mouth at bedtime.   [DISCONTINUED] aspirin EC 81 MG tablet Take 81 mg by mouth daily.   [DISCONTINUED] estradiol (ESTRACE) 1 MG tablet Take 1 mg by mouth in the morning.   [DISCONTINUED] gentamicin ointment (GARAMYCIN) 0.1 % Apply 1 application topically 3  (three) times daily.   [DISCONTINUED] Multiple Vitamins-Minerals (MULTIVITAMIN WITH MINERALS) tablet Take 1 tablet by mouth daily.   [DISCONTINUED] triamcinolone cream (KENALOG) 0.1 % Apply 1 application topically 2 (two) times daily.   [DISCONTINUED] fluticasone  (FLOVENT  HFA) 110 MCG/ACT inhaler Inhale 2 puffs into the lungs 2 (two) times daily.   [DISCONTINUED] Spacer/Aero-Holding Chambers (AEROCHAMBER MV) inhaler Use as instructed   No facility-administered encounter medications on file as of 11/12/2019.      Objective:   Vitals:   11/12/19 1346  BP: 124/68  Pulse: 88  Temp: (!) 97.4 F (36.3 C)  Height: 5' 8 (1.727 m)  Weight: 184 lb 3.2 oz (83.6 kg)  SpO2: 96%  TempSrc: Temporal  BMI (Calculated): 28.01     Physical exam documentation is limited by delayed entry of information.

## 2024-11-07 ENCOUNTER — Other Ambulatory Visit: Payer: Self-pay | Admitting: Internal Medicine

## 2024-11-07 DIAGNOSIS — Z1231 Encounter for screening mammogram for malignant neoplasm of breast: Secondary | ICD-10-CM

## 2024-11-25 ENCOUNTER — Ambulatory Visit
Admission: RE | Admit: 2024-11-25 | Discharge: 2024-11-25 | Disposition: A | Source: Ambulatory Visit | Attending: Internal Medicine | Admitting: Internal Medicine

## 2024-11-25 DIAGNOSIS — Z1231 Encounter for screening mammogram for malignant neoplasm of breast: Secondary | ICD-10-CM | POA: Insufficient documentation

## 2024-11-26 ENCOUNTER — Encounter

## 2025-05-30 ENCOUNTER — Ambulatory Visit (INDEPENDENT_AMBULATORY_CARE_PROVIDER_SITE_OTHER): Admitting: Nurse Practitioner
